# Patient Record
Sex: Female | Born: 1955 | Race: White | Hispanic: No | Smoking: Never smoker
Health system: Southern US, Community
[De-identification: ages and names within clinical notes are randomized; demographics above are authoritative.]

## PROBLEM LIST (undated history)

## (undated) DIAGNOSIS — C439 Malignant melanoma of skin, unspecified: Secondary | ICD-10-CM

## (undated) DIAGNOSIS — R011 Cardiac murmur, unspecified: Secondary | ICD-10-CM

## (undated) DIAGNOSIS — I10 Essential (primary) hypertension: Secondary | ICD-10-CM

## (undated) DIAGNOSIS — F419 Anxiety disorder, unspecified: Secondary | ICD-10-CM

## (undated) DIAGNOSIS — K219 Gastro-esophageal reflux disease without esophagitis: Secondary | ICD-10-CM

## (undated) DIAGNOSIS — J45909 Unspecified asthma, uncomplicated: Secondary | ICD-10-CM

## (undated) DIAGNOSIS — L570 Actinic keratosis: Secondary | ICD-10-CM

## (undated) DIAGNOSIS — M1990 Unspecified osteoarthritis, unspecified site: Secondary | ICD-10-CM

## (undated) DIAGNOSIS — E039 Hypothyroidism, unspecified: Secondary | ICD-10-CM

## (undated) DIAGNOSIS — I639 Cerebral infarction, unspecified: Secondary | ICD-10-CM

## (undated) DIAGNOSIS — T7840XA Allergy, unspecified, initial encounter: Secondary | ICD-10-CM

## (undated) HISTORY — DX: Unspecified osteoarthritis, unspecified site: M19.90

## (undated) HISTORY — DX: Cerebral infarction, unspecified: I63.9

## (undated) HISTORY — DX: Actinic keratosis: L57.0

## (undated) HISTORY — DX: Cardiac murmur, unspecified: R01.1

## (undated) HISTORY — DX: Anxiety disorder, unspecified: F41.9

## (undated) HISTORY — DX: Allergy, unspecified, initial encounter: T78.40XA

## (undated) HISTORY — PX: BACK SURGERY: SHX140

## (undated) HISTORY — PX: CHOLECYSTECTOMY: SHX55

## (undated) HISTORY — DX: Gastro-esophageal reflux disease without esophagitis: K21.9

## (undated) HISTORY — PX: OOPHORECTOMY: SHX86

## (undated) HISTORY — DX: Malignant melanoma of skin, unspecified: C43.9

---

## 1998-08-29 ENCOUNTER — Encounter: Payer: Self-pay | Admitting: Neurosurgery

## 1998-08-29 ENCOUNTER — Inpatient Hospital Stay (HOSPITAL_COMMUNITY): Admission: RE | Admit: 1998-08-29 | Discharge: 1998-08-30 | Payer: Self-pay | Admitting: Neurosurgery

## 1999-03-18 HISTORY — PX: BLADDER SURGERY: SHX569

## 1999-05-31 ENCOUNTER — Encounter: Admission: RE | Admit: 1999-05-31 | Discharge: 1999-05-31 | Payer: Self-pay | Admitting: Family Medicine

## 1999-06-03 ENCOUNTER — Encounter: Payer: Self-pay | Admitting: Family Medicine

## 2004-01-16 DIAGNOSIS — C439 Malignant melanoma of skin, unspecified: Secondary | ICD-10-CM

## 2004-01-16 HISTORY — DX: Malignant melanoma of skin, unspecified: C43.9

## 2004-02-15 HISTORY — PX: MELANOMA EXCISION: SHX5266

## 2004-07-30 ENCOUNTER — Ambulatory Visit: Payer: Self-pay | Admitting: Family Medicine

## 2004-08-02 ENCOUNTER — Ambulatory Visit: Payer: Self-pay | Admitting: Family Medicine

## 2005-03-17 HISTORY — PX: ABDOMINAL HYSTERECTOMY: SHX81

## 2005-09-12 ENCOUNTER — Other Ambulatory Visit: Payer: Self-pay

## 2005-09-18 ENCOUNTER — Ambulatory Visit: Payer: Self-pay | Admitting: Unknown Physician Specialty

## 2006-04-14 ENCOUNTER — Ambulatory Visit: Payer: Self-pay

## 2007-04-05 ENCOUNTER — Ambulatory Visit: Payer: Self-pay | Admitting: Gastroenterology

## 2009-03-14 DIAGNOSIS — L818 Other specified disorders of pigmentation: Secondary | ICD-10-CM

## 2009-03-14 HISTORY — DX: Other specified disorders of pigmentation: L81.8

## 2012-07-28 LAB — HM COLONOSCOPY

## 2013-06-15 ENCOUNTER — Ambulatory Visit: Payer: Self-pay | Admitting: Family Medicine

## 2014-07-17 DIAGNOSIS — E782 Mixed hyperlipidemia: Secondary | ICD-10-CM | POA: Insufficient documentation

## 2014-07-17 DIAGNOSIS — E1169 Type 2 diabetes mellitus with other specified complication: Secondary | ICD-10-CM | POA: Insufficient documentation

## 2014-07-18 DIAGNOSIS — R011 Cardiac murmur, unspecified: Secondary | ICD-10-CM | POA: Insufficient documentation

## 2014-08-16 LAB — LIPID PANEL
Cholesterol: 224 mg/dL — AB (ref 0–200)
HDL: 62 mg/dL (ref 35–70)
LDL CALC: 131 mg/dL
Triglycerides: 157 mg/dL (ref 40–160)

## 2014-08-16 LAB — CBC AND DIFFERENTIAL
HEMATOCRIT: 37 % (ref 36–46)
Hemoglobin: 12.5 g/dL (ref 12.0–16.0)
Neutrophils Absolute: 4 /uL
PLATELETS: 235 10*3/uL (ref 150–399)
WBC: 6.6 10^3/mL

## 2014-08-16 LAB — TSH: TSH: 0.26 u[IU]/mL — AB (ref 0.41–5.90)

## 2014-08-16 LAB — HEPATIC FUNCTION PANEL
ALT: 11 U/L (ref 7–35)
AST: 12 U/L — AB (ref 13–35)
Alkaline Phosphatase: 70 U/L (ref 25–125)
BILIRUBIN, TOTAL: 0.4 mg/dL

## 2014-08-16 LAB — BASIC METABOLIC PANEL
BUN: 12 mg/dL (ref 4–21)
Creatinine: 0.9 mg/dL (ref 0.5–1.1)
GLUCOSE: 99 mg/dL
Potassium: 3.7 mmol/L (ref 3.4–5.3)
SODIUM: 140 mmol/L (ref 137–147)

## 2014-08-17 ENCOUNTER — Telehealth: Payer: Self-pay

## 2014-08-17 DIAGNOSIS — E876 Hypokalemia: Secondary | ICD-10-CM | POA: Insufficient documentation

## 2014-08-17 DIAGNOSIS — C439 Malignant melanoma of skin, unspecified: Secondary | ICD-10-CM | POA: Insufficient documentation

## 2014-08-17 DIAGNOSIS — I10 Essential (primary) hypertension: Secondary | ICD-10-CM | POA: Insufficient documentation

## 2014-08-17 DIAGNOSIS — M1990 Unspecified osteoarthritis, unspecified site: Secondary | ICD-10-CM | POA: Insufficient documentation

## 2014-08-17 DIAGNOSIS — J45909 Unspecified asthma, uncomplicated: Secondary | ICD-10-CM | POA: Insufficient documentation

## 2014-08-17 DIAGNOSIS — I152 Hypertension secondary to endocrine disorders: Secondary | ICD-10-CM | POA: Insufficient documentation

## 2014-08-17 DIAGNOSIS — E1159 Type 2 diabetes mellitus with other circulatory complications: Secondary | ICD-10-CM | POA: Insufficient documentation

## 2014-08-17 DIAGNOSIS — E039 Hypothyroidism, unspecified: Secondary | ICD-10-CM | POA: Insufficient documentation

## 2014-08-17 DIAGNOSIS — J309 Allergic rhinitis, unspecified: Secondary | ICD-10-CM | POA: Insufficient documentation

## 2014-08-17 DIAGNOSIS — Z8249 Family history of ischemic heart disease and other diseases of the circulatory system: Secondary | ICD-10-CM | POA: Insufficient documentation

## 2014-08-17 DIAGNOSIS — E669 Obesity, unspecified: Secondary | ICD-10-CM | POA: Insufficient documentation

## 2014-08-17 NOTE — Telephone Encounter (Signed)
Pt is returning call.  DY#[phone]

## 2014-08-18 MED ORDER — LEVOTHYROXINE SODIUM 125 MCG PO TABS: 125.0000 ug | ORAL_TABLET | Freq: Every day | ORAL | Status: DC

## 2014-08-18 NOTE — Telephone Encounter (Signed)
Message from [place] stable.  Thyroid is still overcorrected. Would recommend decrease Synthroid to 125 and recheck in 6 weeks.  Cholesterol slightly elevated at 233 but 10 year risk of heart disease is only 3 percent.  Recheck in 3 months. Thanks- Dr. Jerilynn Mages.  LMTCB Renaldo Fiddler, CMA

## 2014-08-18 NOTE — Telephone Encounter (Signed)
Pt agrees to decrease Synthroid as below, [facility]. Pt aware of treatment plan as discussed, and verbally acknowledges understanding. Renaldo Fiddler, CMA

## 2014-08-21 ENCOUNTER — Other Ambulatory Visit: Payer: Self-pay | Admitting: Family Medicine

## 2014-08-21 DIAGNOSIS — E876 Hypokalemia: Secondary | ICD-10-CM

## 2014-09-05 ENCOUNTER — Other Ambulatory Visit: Payer: Self-pay | Admitting: Family Medicine

## 2014-09-05 ENCOUNTER — Telehealth: Payer: Self-pay

## 2014-09-05 DIAGNOSIS — Z1231 Encounter for screening mammogram for malignant neoplasm of breast: Secondary | ICD-10-CM

## 2014-09-06 ENCOUNTER — Ambulatory Visit
Admission: RE | Admit: 2014-09-06 | Discharge: 2014-09-06 | Disposition: A | Discharge status: Home / Self Care | Payer: BC Managed Care – PPO | Source: Ambulatory Visit | Attending: Family Medicine | Admitting: Family Medicine

## 2014-09-06 DIAGNOSIS — Z1231 Encounter for screening mammogram for malignant neoplasm of breast: Secondary | ICD-10-CM | POA: Diagnosis not present

## 2014-09-21 ENCOUNTER — Other Ambulatory Visit: Payer: Self-pay | Admitting: Family Medicine

## 2014-09-21 DIAGNOSIS — I10 Essential (primary) hypertension: Secondary | ICD-10-CM

## 2014-09-21 NOTE — Telephone Encounter (Signed)
LVM for pt to return my call.

## 2014-10-10 ENCOUNTER — Ambulatory Visit (INDEPENDENT_AMBULATORY_CARE_PROVIDER_SITE_OTHER): Payer: BC Managed Care – PPO | Admitting: Family Medicine

## 2014-10-10 ENCOUNTER — Encounter: Payer: Self-pay | Admitting: Family Medicine

## 2014-10-10 VITALS — BP 114/70 | HR 94 | Temp 98.1°F | Resp 16 | Ht 66.0 in | Wt 209.8 lb

## 2014-10-10 DIAGNOSIS — R300 Dysuria: Secondary | ICD-10-CM | POA: Diagnosis not present

## 2014-10-10 DIAGNOSIS — N3090 Cystitis, unspecified without hematuria: Secondary | ICD-10-CM

## 2014-10-10 LAB — POCT URINALYSIS DIPSTICK
Bilirubin, UA: NEGATIVE
GLUCOSE UA: NEGATIVE
Ketones, UA: NEGATIVE
Nitrite, UA: NEGATIVE
PH UA: 8
Protein, UA: NEGATIVE
Urobilinogen, UA: NEGATIVE

## 2014-10-10 MED ORDER — NITROFURANTOIN MONOHYD MACRO 100 MG PO CAPS: 100.0000 mg | ORAL_CAPSULE | Freq: Two times a day (BID) | ORAL | Status: DC

## 2014-10-10 NOTE — Progress Notes (Signed)
Subjective:     Patient ID: Angela Villarreal, female   DOB: [birth year], 59 y.o.   MRN: [id number]  HPI  Chief Complaint  Patient presents with  . Dysuria    Patient comes in office today with concerns of burning with urination upon awakening at 4 AM.   Last had an E. Coli infection successfully treated with nitrofurantoin in April of this year.   Review of Systems  Constitutional: Negative for fever and chills.       Objective:   Physical Exam  Constitutional: She appears well-developed and well-nourished. No distress.  Genitourinary:  No cva tenderness       Assessment:    1. Dysuria - POCT urinalysis dipstick - Urine culture - nitrofurantoin, macrocrystal-monohydrate, (MACROBID) 100 MG capsule; Take 1 capsule (100 mg total) by mouth 2 (two) times daily.  Dispense: 14 capsule; Refill: 0  2. Cystitis - nitrofurantoin, macrocrystal-monohydrate, (MACROBID) 100 MG capsule; Take 1 capsule (100 mg total) by mouth 2 (two) times daily.  Dispense: 14 capsule; Refill: 0    Plan:    Further f/u pending culture results.

## 2014-10-10 NOTE — Patient Instructions (Addendum)
Continue to push fluids. We will call you with the culture report.

## 2014-10-13 ENCOUNTER — Encounter: Payer: Self-pay | Admitting: Family Medicine

## 2014-11-22 ENCOUNTER — Other Ambulatory Visit: Payer: Self-pay | Admitting: Family Medicine

## 2014-11-22 DIAGNOSIS — Z780 Asymptomatic menopausal state: Secondary | ICD-10-CM

## 2014-11-22 NOTE — Telephone Encounter (Signed)
Last OV 06/2014  Thanks,   -Kelso Bibby  

## 2015-03-02 ENCOUNTER — Other Ambulatory Visit: Payer: Self-pay | Admitting: Family Medicine

## 2015-03-02 DIAGNOSIS — I10 Essential (primary) hypertension: Secondary | ICD-10-CM

## 2015-03-02 DIAGNOSIS — E876 Hypokalemia: Secondary | ICD-10-CM

## 2015-03-05 DIAGNOSIS — E876 Hypokalemia: Secondary | ICD-10-CM | POA: Insufficient documentation

## 2015-06-02 ENCOUNTER — Other Ambulatory Visit: Payer: Self-pay | Admitting: Family Medicine

## 2015-06-02 DIAGNOSIS — Z780 Asymptomatic menopausal state: Secondary | ICD-10-CM

## 2015-07-11 ENCOUNTER — Ambulatory Visit (INDEPENDENT_AMBULATORY_CARE_PROVIDER_SITE_OTHER): Payer: BC Managed Care – PPO | Admitting: Family Medicine

## 2015-07-11 ENCOUNTER — Encounter: Payer: Self-pay | Admitting: Family Medicine

## 2015-07-11 VITALS — BP 118/78 | HR 78 | Temp 98.4°F | Resp 16 | Wt 226.4 lb

## 2015-07-11 DIAGNOSIS — S39012A Strain of muscle, fascia and tendon of lower back, initial encounter: Secondary | ICD-10-CM | POA: Diagnosis not present

## 2015-07-11 MED ORDER — CYCLOBENZAPRINE HCL 5 MG PO TABS: 5.0000 mg | ORAL_TABLET | Freq: Three times a day (TID) | ORAL | Status: DC | PRN

## 2015-07-11 MED ORDER — TRAMADOL HCL 50 MG PO TABS: 50.0000 mg | ORAL_TABLET | Freq: Four times a day (QID) | ORAL | Status: DC | PRN

## 2015-07-11 NOTE — Progress Notes (Signed)
Subjective:     Patient ID: Angela Villarreal, female   DOB: [birth year], 60 y.o.   MRN: [id number]  HPI  Chief Complaint  Patient presents with  . Back Pain    Patient comes in office with conerns of lower back pain that began yesterday afternoon. Patient states that she has been using a rolling computer bag when working at [employer], she is unsure if heavy lifting triggered pain in back. Patient has been taking otc Aleve and a old prescription prescribed back in 01/2013 for Tramadol  Also has been taking Aleve. Reports two prior back surgeries. Reports increased pain with bending, twisting, and changing positions.   Review of Systems     Objective:   Physical Exam  Constitutional: She appears well-developed and well-nourished. No distress.  Musculoskeletal:  Muscle strength in lower extremities 5/5. SLR's to 90 degrees without radiation of back pain. Tender across her lower lumbar back. Well healed scar noted.       Assessment:    1. Low back strain, initial encounter - cyclobenzaprine (FLEXERIL) 5 MG tablet; Take 1 tablet (5 mg total) by mouth 3 (three) times daily as needed for muscle spasms.  Dispense: 30 tablet; Refill: 0 - traMADol (ULTRAM) 50 MG tablet; Take 1 tablet (50 mg total) by mouth every 6 (six) hours as needed.  Dispense: 28 tablet; Refill: 1    Plan:    Discussed use of cold and warm compresses and continued use of Aleve.

## 2015-07-11 NOTE — Patient Instructions (Signed)
Continue Aleve two pills twice daily with food and application of cold compresses for 20 minutes several x day then go to heat.

## 2015-08-03 ENCOUNTER — Other Ambulatory Visit: Payer: Self-pay | Admitting: Family Medicine

## 2015-08-03 DIAGNOSIS — E039 Hypothyroidism, unspecified: Secondary | ICD-10-CM

## 2015-08-03 NOTE — Telephone Encounter (Signed)
Last TSH checked 08/16/2014

## 2015-09-03 ENCOUNTER — Other Ambulatory Visit: Payer: Self-pay | Admitting: Family Medicine

## 2015-09-03 DIAGNOSIS — I10 Essential (primary) hypertension: Secondary | ICD-10-CM

## 2015-09-03 DIAGNOSIS — E876 Hypokalemia: Secondary | ICD-10-CM

## 2015-09-03 DIAGNOSIS — E039 Hypothyroidism, unspecified: Secondary | ICD-10-CM

## 2015-09-04 ENCOUNTER — Encounter: Payer: Self-pay | Admitting: Family Medicine

## 2015-09-04 ENCOUNTER — Ambulatory Visit (INDEPENDENT_AMBULATORY_CARE_PROVIDER_SITE_OTHER): Payer: BC Managed Care – PPO | Admitting: Family Medicine

## 2015-09-04 VITALS — BP 136/84 | HR 88 | Temp 98.8°F | Resp 16 | Ht 66.5 in | Wt 226.0 lb

## 2015-09-04 DIAGNOSIS — Z1231 Encounter for screening mammogram for malignant neoplasm of breast: Secondary | ICD-10-CM | POA: Diagnosis not present

## 2015-09-04 DIAGNOSIS — I10 Essential (primary) hypertension: Secondary | ICD-10-CM

## 2015-09-04 DIAGNOSIS — Z0000 Encounter for general adult medical examination without abnormal findings: Secondary | ICD-10-CM

## 2015-09-04 DIAGNOSIS — E039 Hypothyroidism, unspecified: Secondary | ICD-10-CM | POA: Diagnosis not present

## 2015-09-04 DIAGNOSIS — E7800 Pure hypercholesterolemia, unspecified: Secondary | ICD-10-CM

## 2015-09-04 DIAGNOSIS — K589 Irritable bowel syndrome without diarrhea: Secondary | ICD-10-CM | POA: Diagnosis not present

## 2015-09-04 LAB — POCT URINALYSIS DIPSTICK
Bilirubin, UA: NEGATIVE
Blood, UA: NEGATIVE
GLUCOSE UA: NEGATIVE
KETONES UA: NEGATIVE
LEUKOCYTES UA: NEGATIVE
Nitrite, UA: NEGATIVE
PROTEIN UA: NEGATIVE
Spec Grav, UA: 1.01
Urobilinogen, UA: 0.2
pH, UA: 7

## 2015-09-04 MED ORDER — HYOSCYAMINE SULFATE ER 0.375 MG PO TB12: 0.3750 mg | ORAL_TABLET | Freq: Two times a day (BID) | ORAL | Status: DC

## 2015-09-04 NOTE — Progress Notes (Signed)
Patient: Angela Villarreal, Female    DOB: [birth year], 60 y.o.   MRN: [id number] Visit Date: 09/04/2015  Today's Provider: Margarita Rana, MD   Chief Complaint  Patient presents with  . Annual Exam   Subjective:    Annual physical exam Angela Villarreal is a 60 y.o. female who presents today for health maintenance and complete physical. She feels well. She reports not exercising; she is having trouble with Plantar Fasciitis.  Also still having trouble with back pain; it is a lot better from April.   She reports she is sleeping well.  -----------------------------------------------------------------   Review of Systems  Constitutional: Negative.   HENT: Negative.   Eyes: Negative.   Respiratory: Negative.   Cardiovascular: Negative.   Gastrointestinal: Positive for diarrhea. Negative for nausea, vomiting, abdominal pain, constipation, blood in stool, abdominal distention, anal bleeding and rectal pain.       History of IBS.    Endocrine: Negative.   Genitourinary: Negative.   Musculoskeletal: Positive for back pain and arthralgias. Negative for myalgias, joint swelling, gait problem, neck pain and neck stiffness.  Skin: Negative.   Allergic/Immunologic: Negative.   Neurological: Negative.   Hematological: Negative for adenopathy. Bruises/bleeds easily.  Psychiatric/Behavioral: Negative.     Social History      She  reports that she has never smoked. She does not have any smokeless tobacco history on file. She reports that she drinks alcohol. She reports that she does not use illicit drugs.       Social History   Social History  . Marital Status: Divorced    Spouse Name: N/A  . Number of Children: N/A  . Years of Education: N/A   Social History Main Topics  . Smoking status: Never Smoker   . Smokeless tobacco: None  . Alcohol Use: Yes     Comment: once monthly  . Drug Use: No  . Sexual Activity: Not Asked   Other Topics Concern  . None   Social History  Narrative    Past Medical History  Diagnosis Date  . Cancer [facility])     melanoma     Patient Active Problem List   Diagnosis Date Noted  . Hypokalemia 03/05/2015  . Post-menopausal 11/22/2014  . Allergic rhinitis 08/17/2014  . Airway hyperreactivity 08/17/2014  . Family history of cardiomyopathy 08/17/2014  . Hypercholesteremia 08/17/2014  . BP (high blood pressure) 08/17/2014  . Decreased potassium in the blood 08/17/2014  . Adult hypothyroidism 08/17/2014  . Adaptive colitis 08/17/2014  . 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 Adiposity 08/17/2014  . Arthritis, degenerative 08/17/2014  . Cardiac murmur 07/18/2014  . Benign essential HTN 07/17/2014    Past Surgical History  Procedure Laterality Date  . Abdominal hysterectomy  2007  . Melanoma excision  02/2004    removed from face  . Back surgery  1989, 2000  . Bladder surgery  2001    Tacked    Family History        Family Status  Relation Status Death Age  . Mother Deceased   . Father Deceased   . Sister Alive         Her family history includes Alzheimer's disease in her mother; Cancer in her mother; Colon cancer in her mother; Non-Hodgkin's lymphoma in her father; Squamous cell carcinoma in her mother; Thyroid disease in her sister.    Allergies  Allergen Reactions  . Gentamicin Sulfate     No outpatient  prescriptions have been marked as taking for the 09/04/15 encounter (Office Visit) with Margarita Rana, MD.    Patient Care Team: Margarita Rana, MD as PCP - General (Family Medicine)     Objective:   Vitals: BP 136/84 mmHg  Pulse 88  Temp(Src) 98.8 F (37.1 C) (Oral)  Resp 16  Ht 5' 6.5" (1.689 m)  Wt 226 lb (102.513 kg)  BMI 35.94 kg/m2   Physical Exam  Constitutional: She is oriented to person, place, and time. She appears well-developed and well-nourished.  HENT:  Head: Normocephalic and atraumatic.  Right Ear: External ear normal.  Left Ear: External ear normal.    Mouth/Throat: Oropharynx is clear and moist.  Eyes: Conjunctivae are normal. Pupils are equal, round, and reactive to light.  Neck: Normal range of motion. Neck supple. Carotid bruit is not present.  Cardiovascular: Normal rate, regular rhythm, normal heart sounds and intact distal pulses.   Pulmonary/Chest: Effort normal and breath sounds normal. Right breast exhibits no inverted nipple, no mass, no nipple discharge, no skin change and no tenderness. Left breast exhibits no inverted nipple, no mass, no nipple discharge, no skin change and no tenderness. Breasts are symmetrical.  Abdominal: Soft. Bowel sounds are normal.  Musculoskeletal: Normal range of motion.  Neurological: She is alert and oriented to person, place, and time.  Skin: Skin is warm and dry.  Psychiatric: She has a normal mood and affect. Her behavior is normal. Judgment and thought content normal.     Depression Screen PHQ 2/9 Scores 09/04/2015  PHQ - 2 Score 0      Assessment & Plan:     Routine Health Maintenance and Physical Exam  Exercise Activities and Dietary recommendations Goals    None      Immunization History  Administered Date(s) Administered  . Tdap 04/15/2010    Health Maintenance  Topic Date Due  . PAP SMEAR  01/31/1977  . INFLUENZA VACCINE  10/16/2015  . MAMMOGRAM  09/05/2016  . TETANUS/TDAP  04/15/2020  . COLONOSCOPY  07/29/2022  . Hepatitis C Screening  Completed  . HIV Screening  Completed      Discussed health benefits of physical activity, and encouraged her to engage in regular exercise appropriate for her age and condition.    ---------- Results for orders placed or performed in visit on 09/04/15  POCT urinalysis dipstick  Result Value Ref Range   Color, UA Yellow    Clarity, UA Clear    Glucose, UA Negative    Bilirubin, UA Negative    Ketones, UA Negative    Spec Grav, UA 1.010    Blood, UA Negative    pH, UA 7.0    Protein, UA Negative    Urobilinogen, UA 0.2     Nitrite, UA Negative    Leukocytes, UA Negative Negative  ----------------------------------------------------------  2. Hypercholesteremia Stable; will check labs.   - CBC with Differential/Platelet - Lipid panel  3. Hypothyroidism, unspecified hypothyroidism type Stable, will check labs.    - CBC with Differential/Platelet - Comprehensive metabolic panel - TSH  4. Benign essential HTN Stable, continue current medications.  Will follow up with Dr. Caryn Section.   - CBC with Differential/Platelet  5. Encounter for screening mammogram for breast cancer Ordered Mammogram   - Mammogram Digital Screening  6. IBS (irritable bowel syndrome) Stable; only needs Hyoscyamine as needed.  Refills provided.   - hyoscyamine (LEVBID) 0.375 MG 12 hr tablet; Take 1 tablet (0.375 mg total) by mouth 2 (two) times  daily.  Dispense: 60 tablet; Refill: 0  Patient was seen and examined by Jerrell Belfast, MD, and note scribed by Ashley Royalty, CMA.  I have reviewed the document for accuracy and completeness and I agree with above. - Jerrell Belfast, MD      Margarita Rana, MD  [facility]

## 2015-09-05 ENCOUNTER — Other Ambulatory Visit: Payer: Self-pay | Admitting: Family Medicine

## 2015-09-06 LAB — CBC WITH DIFFERENTIAL/PLATELET
BASOS: 1 %
Basophils Absolute: 0 10*3/uL (ref 0.0–0.2)
EOS (ABSOLUTE): 0.2 10*3/uL (ref 0.0–0.4)
Eos: 3 %
Hematocrit: 36.4 % (ref 34.0–46.6)
Hemoglobin: 12.5 g/dL (ref 11.1–15.9)
IMMATURE GRANS (ABS): 0 10*3/uL (ref 0.0–0.1)
Immature Granulocytes: 0 %
LYMPHS: 34 %
Lymphocytes Absolute: 2.4 10*3/uL (ref 0.7–3.1)
MCH: 29.2 pg (ref 26.6–33.0)
MCHC: 34.3 g/dL (ref 31.5–35.7)
MCV: 85 fL (ref 79–97)
Monocytes Absolute: 0.3 10*3/uL (ref 0.1–0.9)
Monocytes: 4 %
Neutrophils Absolute: 4.1 10*3/uL (ref 1.4–7.0)
Neutrophils: 58 %
PLATELETS: 241 10*3/uL (ref 150–379)
RBC: 4.28 x10E6/uL (ref 3.77–5.28)
RDW: 13.6 % (ref 12.3–15.4)
WBC: 7.1 10*3/uL (ref 3.4–10.8)

## 2015-09-06 LAB — TSH: TSH: 1.68 u[IU]/mL (ref 0.450–4.500)

## 2015-09-06 LAB — COMPREHENSIVE METABOLIC PANEL
ALK PHOS: 68 IU/L (ref 39–117)
ALT: 11 IU/L (ref 0–32)
AST: 12 IU/L (ref 0–40)
Albumin/Globulin Ratio: 1.4 (ref 1.2–2.2)
Albumin: 4.2 g/dL (ref 3.5–5.5)
BILIRUBIN TOTAL: 0.2 mg/dL (ref 0.0–1.2)
BUN/Creatinine Ratio: 12 (ref 9–23)
BUN: 10 mg/dL (ref 6–24)
CHLORIDE: 97 mmol/L (ref 96–106)
CO2: 27 mmol/L (ref 18–29)
Calcium: 9.4 mg/dL (ref 8.7–10.2)
Creatinine, Ser: 0.81 mg/dL (ref 0.57–1.00)
GFR calc Af Amer: 92 mL/min/{1.73_m2} (ref >59)
GFR calc non Af Amer: 80 mL/min/{1.73_m2} (ref >59)
GLUCOSE: 85 mg/dL (ref 65–99)
Globulin, Total: 3.1 g/dL (ref 1.5–4.5)
Potassium: 3.1 mmol/L — ABNORMAL LOW (ref 3.5–5.2)
Sodium: 143 mmol/L (ref 134–144)
TOTAL PROTEIN: 7.3 g/dL (ref 6.0–8.5)

## 2015-09-06 LAB — LIPID PANEL
Chol/HDL Ratio: 4.5 ratio units — ABNORMAL HIGH (ref 0.0–4.4)
Cholesterol, Total: 223 mg/dL — ABNORMAL HIGH (ref 100–199)
HDL: 50 mg/dL (ref >39)
LDL CALC: 116 mg/dL — AB (ref 0–99)
Triglycerides: 285 mg/dL — ABNORMAL HIGH (ref 0–149)
VLDL CHOLESTEROL CAL: 57 mg/dL — AB (ref 5–40)

## 2015-09-06 LAB — SPECIMEN STATUS REPORT

## 2015-09-07 ENCOUNTER — Other Ambulatory Visit: Payer: Self-pay

## 2015-09-07 DIAGNOSIS — J309 Allergic rhinitis, unspecified: Secondary | ICD-10-CM

## 2015-09-07 DIAGNOSIS — I10 Essential (primary) hypertension: Secondary | ICD-10-CM

## 2015-09-07 DIAGNOSIS — E876 Hypokalemia: Secondary | ICD-10-CM

## 2015-09-07 DIAGNOSIS — S39012A Strain of muscle, fascia and tendon of lower back, initial encounter: Secondary | ICD-10-CM

## 2015-09-07 DIAGNOSIS — E039 Hypothyroidism, unspecified: Secondary | ICD-10-CM

## 2015-09-07 MED ORDER — CYCLOBENZAPRINE HCL 5 MG PO TABS: 5.0000 mg | ORAL_TABLET | Freq: Three times a day (TID) | ORAL | Status: DC | PRN

## 2015-09-07 MED ORDER — SYNTHROID 125 MCG PO TABS: 125.0000 ug | ORAL_TABLET | Freq: Every day | ORAL | Status: DC

## 2015-09-07 MED ORDER — ALBUTEROL SULFATE HFA 108 (90 BASE) MCG/ACT IN AERS: 1.0000 | INHALATION_SPRAY | RESPIRATORY_TRACT | Status: DC | PRN

## 2015-09-07 MED ORDER — ADVAIR DISKUS 250-50 MCG/DOSE IN AEPB
1.0000 | INHALATION_SPRAY | Freq: Two times a day (BID) | RESPIRATORY_TRACT | Status: DC
Start: 2015-09-07 — End: 2019-01-05

## 2015-09-07 MED ORDER — MONTELUKAST SODIUM 10 MG PO TABS: 10.0000 mg | ORAL_TABLET | Freq: Every day | ORAL | Status: AC

## 2015-09-07 MED ORDER — POTASSIUM CHLORIDE CRYS ER 20 MEQ PO TBCR: 20.0000 meq | EXTENDED_RELEASE_TABLET | Freq: Two times a day (BID) | ORAL | Status: DC

## 2015-09-07 MED ORDER — TRIAMTERENE-HCTZ 37.5-25 MG PO TABS: 1.0000 | ORAL_TABLET | Freq: Every day | ORAL | Status: DC

## 2015-09-07 NOTE — Telephone Encounter (Signed)
Printed, please fax or call in to pharmacy. Thank you.   

## 2015-09-07 NOTE — Telephone Encounter (Signed)
-----   Message from Margarita Rana, MD sent at 09/06/2015  2:31 PM EDT ----- Labs stable. Mildly elevated triglycerides, which is concerning for sugar issues.  Make sure to eat healthy, exercise and recheck annually. Also, low potassium. Please see if she is taking her medication. Thanks.

## 2015-09-07 NOTE — Telephone Encounter (Signed)
Pt advised as directed below.  She states she needs her refills sent to [facility].   Thanks,   -Mickel Baas

## 2015-09-12 ENCOUNTER — Encounter: Payer: BC Managed Care – PPO | Admitting: Family Medicine

## 2015-10-22 ENCOUNTER — Ambulatory Visit
Admission: RE | Admit: 2015-10-22 | Discharge: 2015-10-22 | Disposition: A | Discharge status: Home / Self Care | Payer: BC Managed Care – PPO | Source: Ambulatory Visit | Attending: Family Medicine | Admitting: Family Medicine

## 2015-10-22 ENCOUNTER — Ambulatory Visit: Payer: BC Managed Care – PPO

## 2015-10-22 DIAGNOSIS — Z1231 Encounter for screening mammogram for malignant neoplasm of breast: Secondary | ICD-10-CM | POA: Diagnosis present

## 2015-12-04 ENCOUNTER — Other Ambulatory Visit: Payer: Self-pay | Admitting: Family Medicine

## 2016-03-04 ENCOUNTER — Other Ambulatory Visit: Payer: Self-pay | Admitting: Family Medicine

## 2016-03-04 DIAGNOSIS — E039 Hypothyroidism, unspecified: Secondary | ICD-10-CM

## 2016-03-04 NOTE — Telephone Encounter (Signed)
Please review-aa 

## 2016-03-31 ENCOUNTER — Other Ambulatory Visit: Payer: Self-pay | Admitting: Family Medicine

## 2016-03-31 DIAGNOSIS — I10 Essential (primary) hypertension: Secondary | ICD-10-CM

## 2016-03-31 DIAGNOSIS — E876 Hypokalemia: Secondary | ICD-10-CM

## 2016-03-31 NOTE — Telephone Encounter (Signed)
Please review-aa 

## 2016-09-04 ENCOUNTER — Ambulatory Visit
Admission: RE | Admit: 2016-09-04 | Discharge: 2016-09-04 | Disposition: A | Discharge status: Home / Self Care | Payer: BC Managed Care – PPO | Source: Ambulatory Visit | Attending: Family Medicine | Admitting: Family Medicine

## 2016-09-04 ENCOUNTER — Ambulatory Visit (INDEPENDENT_AMBULATORY_CARE_PROVIDER_SITE_OTHER): Payer: BC Managed Care – PPO | Admitting: Family Medicine

## 2016-09-04 ENCOUNTER — Encounter: Payer: Self-pay | Admitting: Family Medicine

## 2016-09-04 VITALS — BP 132/72 | HR 72 | Temp 98.1°F | Resp 16 | Ht 67.0 in | Wt 226.0 lb

## 2016-09-04 DIAGNOSIS — M545 Low back pain: Secondary | ICD-10-CM

## 2016-09-04 DIAGNOSIS — Z85820 Personal history of malignant melanoma of skin: Secondary | ICD-10-CM | POA: Diagnosis not present

## 2016-09-04 DIAGNOSIS — Z780 Asymptomatic menopausal state: Secondary | ICD-10-CM

## 2016-09-04 DIAGNOSIS — I83813 Varicose veins of bilateral lower extremities with pain: Secondary | ICD-10-CM | POA: Insufficient documentation

## 2016-09-04 DIAGNOSIS — E039 Hypothyroidism, unspecified: Secondary | ICD-10-CM

## 2016-09-04 DIAGNOSIS — I10 Essential (primary) hypertension: Secondary | ICD-10-CM

## 2016-09-04 DIAGNOSIS — M5440 Lumbago with sciatica, unspecified side: Secondary | ICD-10-CM | POA: Diagnosis present

## 2016-09-04 DIAGNOSIS — K589 Irritable bowel syndrome without diarrhea: Secondary | ICD-10-CM | POA: Diagnosis not present

## 2016-09-04 DIAGNOSIS — Z0000 Encounter for general adult medical examination without abnormal findings: Secondary | ICD-10-CM

## 2016-09-04 MED ORDER — ESTRADIOL 0.5 MG PO TABS: 0.2500 mg | ORAL_TABLET | Freq: Every day | ORAL | 2 refills | Status: DC

## 2016-09-04 NOTE — Progress Notes (Signed)
Patient: Angela Villarreal, Female    DOB: 10/09/1955, 61 y.o.   MRN: [id number] Visit Date: 09/04/2016  Today's Provider: Lelon Huh, MD   Chief Complaint  Patient presents with  . Annual Exam  . Hypertension  . Hyperlipidemia  . Back Pain   Subjective:  This is a previous patient of Dr. Venia Minks present today as new patient to me to establish care and follow up on chronic medical problems.     Annual physical exam Angela Villarreal is a 61 y.o. female who presents today for health maintenance and complete physical. She feels well. She reports exercising occasionally. She reports she is sleeping fairly well.  Colonoscopy- 07/28/2012. Repeat in 5 years.  Mammogram- 10/22/2015. Normal.  Hx of hysterectomy in 2007 Tdap- 04/15/2010    Hypertension, follow-up:  BP Readings from Last 3 Encounters:  09/04/16 132/72  09/04/15 136/84  07/11/15 118/78    She was last seen for hypertension 1 years ago.  BP at that visit was 136/84. Management since that visit includes no changes. She reports good compliance with treatment. She is not having side effects.  She is exercising. She is adherent to low salt diet.   Outside blood pressures are checked occasionally. She is experiencing lower extremity edema.  Patient denies exertional chest pressure/discomfort and palpitations.   Cardiovascular risk factors include dyslipidemia.     Weight trend: stable Wt Readings from Last 3 Encounters:  09/04/16 226 lb (102.5 kg)  09/04/15 226 lb (102.5 kg)  07/11/15 226 lb 6.4 oz (102.7 kg)    Current diet: well balanced   Lipid/Cholesterol, Follow-up:   Last seen for this1 years ago.  Management changes since that visit include no changes. . Last Lipid Panel:    Component Value Date/Time   CHOL 223 (H) 09/05/2015 1053   TRIG 285 (H) 09/05/2015 1053   HDL 50 09/05/2015 1053   CHOLHDL 4.5 (H) 09/05/2015 1053   LDLCALC 116 (H) 09/05/2015 1053    Risk factors for vascular  disease include hypertension  She reports good compliance with treatment. She is not having side effects.  Current symptoms include none and have been stable. Weight trend: stable Prior visit with dietician: no Current diet: well balanced Current exercise: none  Wt Readings from Last 3 Encounters:  09/04/16 226 lb (102.5 kg)  09/04/15 226 lb (102.5 kg)  07/11/15 226 lb 6.4 oz (102.7 kg)    Back pain: Patient reports that she has had constant back pain for the last 3 months. She denies any injuries. Patient reports that the pain radiates to her tailbone. The pain is worse when she sits for a long period of time.   She also complains of worsening varicose veins. She recently saw her dermatologist and had to be treated for venous stasis dermatitis and she inquires if anything can be done about her varicose veins.   Also wishes to discuss ERT. She had total hysterectomy and BSO 10 years ago. 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 Now down to 1/2 0.5mg  a day and having some hot flashes.    Review of Systems  Constitutional: Negative.   HENT: Negative.   Eyes: Negative.   Respiratory: Negative.   Cardiovascular: Positive for leg swelling.  Gastrointestinal: Negative.   Endocrine: Negative.   Genitourinary: Negative.   Musculoskeletal: Positive for arthralgias and back pain.  Allergic/Immunologic: Negative.   Neurological: Negative.   Hematological: Bruises/bleeds easily.  Psychiatric/Behavioral: Negative.     Social  History      She  reports that she has never smoked. She has never used smokeless tobacco. She reports that she drinks alcohol. She reports that she does not use drugs.       Social History   Social History  . Marital status: Divorced    Spouse name: N/A  . Number of children: N/A  . Years of education: N/A   Social History Main Topics  . Smoking status: Never Smoker  . Smokeless tobacco: Never Used  . Alcohol use Yes     Comment: once monthly  .  Drug use: No  . Sexual activity: Not Asked   Other Topics Concern  . None   Social History Narrative  . None    Past Medical History:  Diagnosis Date  . Cancer [facility])    melanoma     Patient Active Problem List   Diagnosis Date Noted  . IBS (irritable bowel syndrome) 09/04/2015  . Hypokalemia 03/05/2015  . Post-menopausal 11/22/2014  . Allergic rhinitis 08/17/2014  . Airway hyperreactivity 08/17/2014  . Family history of cardiomyopathy 08/17/2014  . Hypercholesteremia 08/17/2014  . BP (high blood pressure) 08/17/2014  . Decreased potassium in the blood 08/17/2014  . Adult hypothyroidism 08/17/2014  . Adaptive colitis 08/17/2014  . 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 Adiposity 08/17/2014  . Arthritis, degenerative 08/17/2014  . Cardiac murmur 07/18/2014  . Benign essential HTN 07/17/2014    Past Surgical History:  Procedure Laterality Date  . ABDOMINAL HYSTERECTOMY  2007  . BACK SURGERY  1989, 2000  . BLADDER SURGERY  2001   Tacked  . MELANOMA EXCISION  02/2004   removed from face    Family History        Family Status  Relation Status  . Mother Deceased  . Father Deceased  . Sister Alive        Her family history includes Alzheimer's disease in her mother; Cancer in her mother; Colon cancer in her mother; Non-Hodgkin's lymphoma in her father; Squamous cell carcinoma in her mother; Thyroid disease in her sister.     Allergies  Allergen Reactions  . Gentamicin Sulfate      Current Outpatient Prescriptions:  .  ADVAIR DISKUS 250-50 MCG/DOSE AEPB, Inhale 1 puff into the lungs 2 (two) times daily., Disp: 180 each, Rfl: 3 .  albuterol (PROVENTIL HFA;VENTOLIN HFA) 108 (90 Base) MCG/ACT inhaler, Inhale 1-2 puffs into the lungs every 4 (four) hours as needed for wheezing or shortness of breath., Disp: 3 Inhaler, Rfl: 3 .  cetirizine (ZYRTEC) 10 MG tablet, Take by mouth., Disp: , Rfl:  .  cyclobenzaprine (FLEXERIL) 5 MG tablet, Take 1 tablet (5 mg  total) by mouth 3 (three) times daily as needed for muscle spasms., Disp: 30 tablet, Rfl: 5 .  estradiol (ESTRACE) 0.5 MG tablet, TAKE ONE TABLET BY MOUTH EVERY DAY (Patient taking differently: take 1/2 tablet daily), Disp: 30 tablet, Rfl: 9 .  FLUTICASONE PROPIONATE, NASAL, NA, Place into the nose., Disp: , Rfl:  .  hyoscyamine (LEVBID) 0.375 MG 12 hr tablet, Take 1 tablet (0.375 mg total) by mouth 2 (two) times daily., Disp: 60 tablet, Rfl: 0 .  montelukast (SINGULAIR) 10 MG tablet, Take 1 tablet (10 mg total) by mouth at bedtime., Disp: 90 tablet, Rfl: 1 .  PAZEO 0.7 % SOLN, , Disp: , Rfl:  .  potassium chloride SA (K-DUR,KLOR-CON) 20 MEQ tablet, TAKE ONE TABLET BY MOUTH TWICE DAILY, Disp: 180 tablet, Rfl: 2 .  SYNTHROID 125 MCG tablet, TAKE ONE TABLET BY MOUTH EVERY DAY, Disp: 90 tablet, Rfl: 3 .  traMADol (ULTRAM) 50 MG tablet, Take 1 tablet (50 mg total) by mouth every 6 (six) hours as needed., Disp: 28 tablet, Rfl: 1 .  triamterene-hydrochlorothiazide (MAXZIDE-25) 37.5-25 MG tablet, TAKE ONE TABLET BY MOUTH EVERY DAY, Disp: 90 tablet, Rfl: 2   Patient Care Team: Birdie Sons, MD as PCP - General (Family Medicine)      Objective:   Vitals: BP 132/72 (BP Location: Right Arm, Patient Position: Sitting, Cuff Size: Large)   Pulse 72   Temp 98.1 F (36.7 C)   Resp 16   Ht 5\' 7"  (1.702 m)   Wt 226 lb (102.5 kg)   SpO2 95%   BMI 35.40 kg/m    Vitals:   09/04/16 1030  BP: 132/72  Pulse: 72  Resp: 16  Temp: 98.1 F (36.7 C)  SpO2: 95%  Weight: 226 lb (102.5 kg)  Height: 5\' 7"  (1.702 m)     Physical Exam  Constitutional: She is oriented to person, place, and time. She appears well-developed and well-nourished.  HENT:  Head: Normocephalic and atraumatic.  Right Ear: External ear normal.  Left Ear: External ear normal.  Nose: Nose normal.  Mouth/Throat: Oropharynx is clear and moist.  Eyes: Conjunctivae and EOM are normal. Pupils are equal, round, and reactive to light.    Cardiovascular: Normal rate, regular rhythm, normal heart sounds and intact distal pulses.   Pulmonary/Chest: Effort normal and breath sounds normal. Right breast exhibits no inverted nipple, no mass, no nipple discharge, no skin change and no tenderness. Left breast exhibits no inverted nipple, no mass, no nipple discharge, no skin change and no tenderness. Breasts are symmetrical.  Abdominal: Soft. Bowel sounds are normal.  Musculoskeletal: Normal range of motion.  Neurological: She is alert and oriented to person, place, and time.  Skin: Skin is warm and dry.  Psychiatric: She has a normal mood and affect. Her behavior is normal. Judgment and thought content normal.     Depression Screen PHQ 2/9 Scores 09/04/2016 09/04/2015  PHQ - 2 Score 0 0  PHQ- 9 Score 3 -      Assessment & Plan:     Routine Health Maintenance and Physical Exam  Exercise Activities and Dietary recommendations Goals    None      Immunization History  Administered Date(s) Administered  . Tdap 04/15/2010    Health Maintenance  Topic Date Due  . INFLUENZA VACCINE  10/15/2016  . COLONOSCOPY  07/28/2017  . MAMMOGRAM  10/21/2017  . TETANUS/TDAP  04/15/2020  . Hepatitis C Screening  Completed  . HIV Screening  Completed     Discussed health benefits of physical activity, and encouraged her to engage in regular exercise appropriate for her age and condition.   1. Annual physical exam Generally doing well. Work on diet and exericise.   - Comprehensive metabolic panel - Lipid panel - T4 AND TSH  2. Essential hypertension Well controlled.  Continue current medications.    3. Adult hypothyroidism Check TSH  4. Irritable bowel syndrome, unspecified type Stable.   5. Low back pain, unspecified back pain laterality, unspecified chronicity, with sciatica presence unspecified No trauma. Check xr - DG Sacrum/Coccyx; Future  6. Varicose veins of bilateral lower extremities with pain Causing some  issues with statis dermatitis. She wishes to to see if something can be done about veins.  - Ambulatory referral to Vascular Surgery  7.  History of malignant melanoma Continue routine follow up Nehemiah Massed.  8. Post menopausal Advised to wean or stop estrogen replacement over the next few months.     Lelon Huh, MD  [facility]

## 2016-09-04 NOTE — Patient Instructions (Signed)
Preventive Care 40-64 Years, Female Preventive care refers to lifestyle choices and visits with your health care provider that can promote health and wellness. What does preventive care include?  A yearly physical exam. This is also called an annual well check.  Dental exams once or twice a year.  Routine eye exams. Ask your health care provider how often you should have your eyes checked.  Personal lifestyle choices, including: ? Daily care of your teeth and gums. ? Regular physical activity. ? Eating a healthy diet. ? Avoiding tobacco and drug use. ? Limiting alcohol use. ? Practicing safe sex. ? Taking low-dose aspirin daily starting at age 58. ? Taking vitamin and mineral supplements as recommended by your health care provider. What happens during an annual well check? The services and screenings done by your health care provider during your annual well check will depend on your age, overall health, lifestyle risk factors, and family history of disease. Counseling Your health care provider may ask you questions about your:  Alcohol use.  Tobacco use.  Drug use.  Emotional well-being.  Home and relationship well-being.  Sexual activity.  Eating habits.  Work and work Statistician.  Method of birth control.  Menstrual cycle.  Pregnancy history.  Screening You may have the following tests or measurements:  Height, weight, and BMI.  Blood pressure.  Lipid and cholesterol levels. These may be checked every 5 years, or more frequently if you are over 81 years old.  Skin check.  Lung cancer screening. You may have this screening every year starting at age 78 if you have a 30-pack-year history of smoking and currently smoke or have quit within the past 15 years.  Fecal occult blood test (FOBT) of the stool. You may have this test every year starting at age 65.  Flexible sigmoidoscopy or colonoscopy. You may have a sigmoidoscopy every 5 years or a colonoscopy  every 10 years starting at age 30.  Hepatitis C blood test.  Hepatitis B blood test.  Sexually transmitted disease (STD) testing.  Diabetes screening. This is done by checking your blood sugar (glucose) after you have not eaten for a while (fasting). You may have this done every 1-3 years.  Mammogram. This may be done every 1-2 years. Talk to your health care provider about when you should start having regular mammograms. This may depend on whether you have a family history of breast cancer.  BRCA-related cancer screening. This may be done if you have a family history of breast, ovarian, tubal, or peritoneal cancers.  Pelvic exam and Pap test. This may be done every 3 years starting at age 80. Starting at age 36, this may be done every 5 years if you have a Pap test in combination with an HPV test.  Bone density scan. This is done to screen for osteoporosis. You may have this scan if you are at high risk for osteoporosis.  Discuss your test results, treatment options, and if necessary, the need for more tests with your health care provider. Vaccines Your health care provider may recommend certain vaccines, such as:  Influenza vaccine. This is recommended every year.  Tetanus, diphtheria, and acellular pertussis (Tdap, Td) vaccine. You may need a Td booster every 10 years.  Varicella vaccine. You may need this if you have not been vaccinated.  Zoster vaccine. You may need this after age 5.  Measles, mumps, and rubella (MMR) vaccine. You may need at least one dose of MMR if you were born in  1957 or later. You may also need a second dose.  Pneumococcal 13-valent conjugate (PCV13) vaccine. You may need this if you have certain conditions and were not previously vaccinated.  Pneumococcal polysaccharide (PPSV23) vaccine. You may need one or two doses if you smoke cigarettes or if you have certain conditions.  Meningococcal vaccine. You may need this if you have certain  conditions.  Hepatitis A vaccine. You may need this if you have certain conditions or if you travel or work in places where you may be exposed to hepatitis A.  Hepatitis B vaccine. You may need this if you have certain conditions or if you travel or work in places where you may be exposed to hepatitis B.  Haemophilus influenzae type b (Hib) vaccine. You may need this if you have certain conditions.  Talk to your health care provider about which screenings and vaccines you need and how often you need them. This information is not intended to replace advice given to you by your health care provider. Make sure you discuss any questions you have with your health care provider. Document Released: 03/30/2015 Document Revised: 11/21/2015 Document Reviewed: 01/02/2015 Elsevier Interactive Patient Education  2017 [employer].

## 2016-09-05 LAB — COMPREHENSIVE METABOLIC PANEL
ALT: 13 IU/L (ref 0–32)
AST: 17 IU/L (ref 0–40)
Albumin/Globulin Ratio: 1.4 (ref 1.2–2.2)
Albumin: 4.3 g/dL (ref 3.6–4.8)
Alkaline Phosphatase: 80 IU/L (ref 39–117)
BUN/Creatinine Ratio: 16 (ref 12–28)
BUN: 14 mg/dL (ref 8–27)
Bilirubin Total: 0.3 mg/dL (ref 0.0–1.2)
CO2: 23 mmol/L (ref 20–29)
Calcium: 9.6 mg/dL (ref 8.7–10.3)
Chloride: 102 mmol/L (ref 96–106)
Creatinine, Ser: 0.87 mg/dL (ref 0.57–1.00)
GFR, EST AFRICAN AMERICAN: 84 mL/min/{1.73_m2} (ref >59)
GFR, EST NON AFRICAN AMERICAN: 73 mL/min/{1.73_m2} (ref >59)
Globulin, Total: 3 g/dL (ref 1.5–4.5)
Glucose: 97 mg/dL (ref 65–99)
POTASSIUM: 4.2 mmol/L (ref 3.5–5.2)
Sodium: 142 mmol/L (ref 134–144)
TOTAL PROTEIN: 7.3 g/dL (ref 6.0–8.5)

## 2016-09-05 LAB — LIPID PANEL
CHOL/HDL RATIO: 4.2 ratio (ref 0.0–4.4)
Cholesterol, Total: 224 mg/dL — ABNORMAL HIGH (ref 100–199)
HDL: 53 mg/dL (ref >39)
LDL Calculated: 137 mg/dL — ABNORMAL HIGH (ref 0–99)
Triglycerides: 168 mg/dL — ABNORMAL HIGH (ref 0–149)
VLDL CHOLESTEROL CAL: 34 mg/dL (ref 5–40)

## 2016-09-05 LAB — T4 AND TSH
T4, Total: 8.4 ug/dL (ref 4.5–12.0)
TSH: 2.51 u[IU]/mL (ref 0.450–4.500)

## 2016-09-08 ENCOUNTER — Encounter: Payer: Self-pay | Admitting: Family Medicine

## 2016-09-12 ENCOUNTER — Other Ambulatory Visit: Payer: Self-pay | Admitting: Family Medicine

## 2016-09-12 DIAGNOSIS — Z1231 Encounter for screening mammogram for malignant neoplasm of breast: Secondary | ICD-10-CM

## 2016-10-03 ENCOUNTER — Other Ambulatory Visit: Payer: Self-pay | Admitting: Family Medicine

## 2016-10-03 DIAGNOSIS — I10 Essential (primary) hypertension: Secondary | ICD-10-CM

## 2016-10-03 DIAGNOSIS — E876 Hypokalemia: Secondary | ICD-10-CM

## 2016-10-07 ENCOUNTER — Encounter (INDEPENDENT_AMBULATORY_CARE_PROVIDER_SITE_OTHER): Payer: Self-pay | Admitting: Vascular Surgery

## 2016-10-07 ENCOUNTER — Ambulatory Visit (INDEPENDENT_AMBULATORY_CARE_PROVIDER_SITE_OTHER): Payer: BC Managed Care – PPO | Admitting: Vascular Surgery

## 2016-10-07 VITALS — BP 135/86 | HR 87 | Resp 17 | Ht 67.0 in | Wt 225.6 lb

## 2016-10-07 DIAGNOSIS — I83813 Varicose veins of bilateral lower extremities with pain: Secondary | ICD-10-CM

## 2016-10-07 DIAGNOSIS — E7800 Pure hypercholesterolemia, unspecified: Secondary | ICD-10-CM

## 2016-10-07 DIAGNOSIS — I10 Essential (primary) hypertension: Secondary | ICD-10-CM | POA: Diagnosis not present

## 2016-10-07 DIAGNOSIS — M7989 Other specified soft tissue disorders: Secondary | ICD-10-CM | POA: Insufficient documentation

## 2016-10-07 NOTE — Progress Notes (Signed)
Patient ID: Angela Villarreal, female   DOB: 1956/03/05, 61 y.o.   MRN: [id number]  Chief Complaint  Patient presents with  . Varicose Veins    HPI Angela Villarreal is a 61 y.o. female.  I am asked to see the patient by Dr. Caryn Section for evaluation of swelling and varicose veins of the leg.  The patient presents with complaints of symptomatic varicosities of the lower extremities. The patient reports a long standing history of varicosities and they have become painful over time. There was no clear inciting event or causative factor that started the symptoms as her veins of been present for over 30 years, but the swelling has gotten significantly worse after a significant fall and hematoma last fall. It took about 3 months for the bruising in swelling to completely go away from just the trauma, and following this there was stasis dermatitis and swelling of the right lower leg.  The right leg is more severly affected. The patient elevates the legs for relief. The pain is described as stinging and burning. The symptoms are generally most severe in the evening, particularly when they have been on their feet for long periods of time. Compression stockings has been used to try to improve the symptoms with some success. The patient complains of regular swelling as an associated symptom. The patient has no previous history of deep venous thrombosis or superficial thrombophlebitis to their knowledge.     Past Medical History:  Diagnosis Date  . Cancer ([place])    melanoma    Past Surgical History:  Procedure Laterality Date  . ABDOMINAL HYSTERECTOMY  2007   due to fibroids. patient reports she also had ovaries removed.   [facility] Kitchen BACK SURGERY  1989, 2000  . BLADDER SURGERY  2001   Tacked  . MELANOMA EXCISION  02/2004   removed from face    Family History  Problem Relation Age of Onset  . Colon cancer Mother   . Squamous cell carcinoma Mother   . Alzheimer's disease Mother   . Cancer Mother    colon  . Non-Hodgkin's lymphoma Father   . Thyroid disease Sister      Social History Social History  Substance Use Topics  . Smoking status: Never Smoker  . Smokeless tobacco: Never Used  . Alcohol use Yes     Comment: once monthly  No IV drug use  Allergies  Allergen Reactions  . Gentamicin Sulfate     Current Outpatient Prescriptions  Medication Sig Dispense Refill  . ADVAIR DISKUS 250-50 MCG/DOSE AEPB Inhale 1 puff into the lungs 2 (two) times daily. 180 each 3  . albuterol (PROVENTIL HFA;VENTOLIN HFA) 108 (90 Base) MCG/ACT inhaler Inhale 1-2 puffs into the lungs every 4 (four) hours as needed for wheezing or shortness of breath. 3 Inhaler 3  . cetirizine (ZYRTEC) 10 MG tablet Take by mouth.    . cyclobenzaprine (FLEXERIL) 5 MG tablet Take 1 tablet (5 mg total) by mouth 3 (three) times daily as needed for muscle spasms. 30 tablet 5  . estradiol (ESTRACE) 0.5 MG tablet Take 0.5 tablets (0.25 mg total) by mouth daily. 30 tablet 2  . FLUTICASONE PROPIONATE, NASAL, NA Place into the nose.    . hyoscyamine (LEVBID) 0.375 MG 12 hr tablet Take 1 tablet (0.375 mg total) by mouth 2 (two) times daily. 60 tablet 0  . montelukast (SINGULAIR) 10 MG tablet Take 1 tablet (10 mg total) by mouth at bedtime. 90 tablet 1  .  potassium chloride SA (K-DUR,KLOR-CON) 20 MEQ tablet TAKE ONE TABLET BY MOUTH TWICE DAILY 180 tablet 4  . SYNTHROID 125 MCG tablet TAKE ONE TABLET BY MOUTH EVERY DAY 90 tablet 3  . traMADol (ULTRAM) 50 MG tablet Take 1 tablet (50 mg total) by mouth every 6 (six) hours as needed. 28 tablet 1  . triamterene-hydrochlorothiazide (MAXZIDE-25) 37.5-25 MG tablet TAKE ONE TABLET BY MOUTH EVERY DAY 90 tablet 4  . PAZEO 0.7 % SOLN      No current facility-administered medications for this visit.       REVIEW OF SYSTEMS (Negative unless checked)  Constitutional: [] Weight loss  [] Fever  [] Chills Cardiac: [] Chest pain   [] Chest pressure   [] Palpitations   [] Shortness of breath  when laying flat   [] Shortness of breath at rest   [] Shortness of breath with exertion. Vascular:  [] Pain in legs with walking   [] Pain in legs at rest   [] Pain in legs when laying flat   [] Claudication   [] Pain in feet when walking  [] Pain in feet at rest  [] Pain in feet when laying flat   [] History of DVT   [] Phlebitis   [x] Swelling in legs   [x] Varicose veins   [] Non-healing ulcers Pulmonary:   [] Uses home oxygen   [] Productive cough   [] Hemoptysis   [] Wheeze  [] COPD   [] Asthma Neurologic:  [] Dizziness  [] Blackouts   [] Seizures   [] History of stroke   [] History of TIA  [] Aphasia   [] Temporary blindness   [] Dysphagia   [] Weakness or numbness in arms   [] Weakness or numbness in legs Musculoskeletal:  [] Arthritis   [] Joint swelling   [] Joint pain   [] Low back pain Hematologic:  [] Easy bruising  [] Easy bleeding   [] Hypercoagulable state   [] Anemic  [] Hepatitis Gastrointestinal:  [] Blood in stool   [] Vomiting blood  [] Gastroesophageal reflux/heartburn   [] Abdominal pain Genitourinary:  [] Chronic kidney disease   [] Difficult urination  [] Frequent urination  [] Burning with urination   [] Hematuria Skin:  [] Rashes   [] Ulcers   [] Wounds Psychological:  [] History of anxiety   []  History of major depression.    Physical Exam BP 135/86   Pulse 87   Resp 17   Ht 5\' 7"  (1.702 m)   Wt 225 lb 9.6 oz (102.3 kg)   BMI 35.33 kg/m  Gen:  WD/WN, NAD Head: [place]/AT, No temporalis wasting.  Ear/Nose/Throat: Hearing grossly intact, dentition good Eyes: Sclera non-icteric. Conjunctiva clear Neck: Supple, no nuchal rigidity. Trachea midline Pulmonary:  Good air movement, no use of accessory muscles, respirations not labored.  Cardiac: RRR, No JVD Vascular: Varicosities diffuse and measuring up to 3 mm in the right lower extremity        Varicosities diffuse and measuring up to 3-4 mm in the left lower extremity Vessel Right Left  Radial Palpable Palpable  Ulnar Palpable Palpable  Brachial Palpable Palpable    Carotid Palpable, without bruit Palpable, without bruit  Aorta Not palpable N/A  Femoral Palpable Palpable  Popliteal Palpable Palpable  PT Palpable Palpable  DP Palpable Palpable   Gastrointestinal: soft, non-tender/non-distended.  Musculoskeletal: M/S 5/5 throughout.   2 + RLE edema.  1 + LLE edema Neurologic: Sensation grossly intact in extremities.  Symmetrical.  Speech is fluent.  Psychiatric: Judgment intact, Mood & affect appropriate for pt's clinical situation. Dermatologic: No rashes or ulcers noted.  No cellulitis or open wounds.    Radiology No results found.  Labs Recent Results (from the past 2160 hour(s))  Comprehensive metabolic  panel     Status: None   Collection Time: 09/04/16 11:13 AM  Result Value Ref Range   Glucose 97 65 - 99 mg/dL   BUN 14 8 - 27 mg/dL   Creatinine, Ser 0.87 0.57 - 1.00 mg/dL   GFR calc non Af Amer 73 >59 mL/min/1.73   GFR calc Af Amer 84 >59 mL/min/1.73   BUN/Creatinine Ratio 16 12 - 28   Sodium 142 134 - 144 mmol/L   Potassium 4.2 3.5 - 5.2 mmol/L   Chloride 102 96 - 106 mmol/L   CO2 23 20 - 29 mmol/L    Comment:               **Please note reference interval change**   Calcium 9.6 8.7 - 10.3 mg/dL   Total Protein 7.3 6.0 - 8.5 g/dL   Albumin 4.3 3.6 - 4.8 g/dL   Globulin, Total 3.0 1.5 - 4.5 g/dL   Albumin/Globulin Ratio 1.4 1.2 - 2.2   Bilirubin Total 0.3 0.0 - 1.2 mg/dL   Alkaline Phosphatase 80 39 - 117 IU/L   AST 17 0 - 40 IU/L   ALT 13 0 - 32 IU/L  Lipid panel     Status: Abnormal   Collection Time: 09/04/16 11:13 AM  Result Value Ref Range   Cholesterol, Total 224 (H) 100 - 199 mg/dL   Triglycerides 168 (H) 0 - 149 mg/dL   HDL 53 >39 mg/dL   VLDL Cholesterol Cal 34 5 - 40 mg/dL   LDL Calculated 137 (H) 0 - 99 mg/dL   Chol/HDL Ratio 4.2 0.0 - 4.4 ratio    Comment:                                   T. Chol/HDL Ratio                                             Men  Women                               1/2 Avg.Risk   3.4    3.3                                   Avg.Risk  5.0    4.4                                2X Avg.Risk  9.6    7.1                                3X Avg.Risk 23.4   11.0   T4 AND TSH     Status: None   Collection Time: 09/04/16 11:13 AM  Result Value Ref Range   TSH 2.510 0.450 - 4.500 uIU/mL   T4, Total 8.4 4.5 - 12.0 ug/dL    Assessment/Plan:  Benign essential HTN blood pressure control important in reducing the progression of atherosclerotic disease. On appropriate oral medications.   Hypercholesteremia lipid control important in reducing the progression of atherosclerotic disease.  Swelling of limb It sounds like she may have a component of some lymphedema after the trauma to her leg, but clearly venous disease is playing a contributing factor as well. Compression stockings, elevation, and venous workup is planned below. We will assess for the need for a lymphedema pump after evaluation of her venous disease.  Varicose veins of bilateral lower extremities with pain See below    The patient has symptoms consistent with chronic venous insufficiency. We discussed the natural history and treatment options for venous disease. I recommended the regular use of 20 - 30 mm Hg compression stockings, and prescribed these today. I recommended leg elevation and anti-inflammatories as needed for pain. I have also recommended a complete venous duplex to assess the venous system for reflux or thrombotic issues. This can be done at the patient's convenience. I will see the patient back in 3 months to assess the response to conservative management, and determine further treatment options.     Leotis Pain 10/07/2016, 4:07 PM   This note was created with Dragon medical transcription system.  Any errors from dictation are unintentional.

## 2016-10-07 NOTE — Assessment & Plan Note (Signed)
It sounds like she may have a component of some lymphedema after the trauma to her leg, but clearly venous disease is playing a contributing factor as well. Compression stockings, elevation, and venous workup is planned below. We will assess for the need for a lymphedema pump after evaluation of her venous disease.

## 2016-10-07 NOTE — Assessment & Plan Note (Signed)
See below

## 2016-10-07 NOTE — Assessment & Plan Note (Signed)
lipid control important in reducing the progression of atherosclerotic disease.   

## 2016-10-07 NOTE — Patient Instructions (Signed)
Varicose Veins Varicose veins are veins that have become enlarged and twisted. They are usually seen in the legs but can occur in other parts of the body as well. What are the causes? This condition is the result of valves in the veins not working properly. Valves in the veins help to return blood from the leg to the heart. If these valves are damaged, blood flows backward and backs up into the veins in the leg near the skin. This causes the veins to become larger. What increases the risk? People who are on their feet a lot, who are pregnant, or who are overweight are more likely to develop varicose veins. What are the signs or symptoms?  Bulging, twisted-appearing, bluish veins, most commonly found on the legs.  Leg pain or a feeling of heaviness. These symptoms may be worse at the end of the day.  Leg swelling.  Changes in skin color. How is this diagnosed? A health care provider can usually diagnose varicose veins by examining your legs. Your health care provider may also recommend an ultrasound of your leg veins. How is this treated? Most varicose veins can be treated at home.However, other treatments are available for people who have persistent symptoms or want to improve the cosmetic appearance of the varicose veins. These treatment options include:  Sclerotherapy. A solution is injected into the vein to close it off.  Laser treatment. A laser is used to heat the vein to close it off.  Radiofrequency vein ablation. An electrical current produced by radio waves is used to close off the vein.  Phlebectomy. The vein is surgically removed through small incisions made over the varicose vein.  Vein ligation and stripping. The vein is surgically removed through incisions made over the varicose vein after the vein has been tied (ligated). Follow these instructions at home:   Do not stand or sit in one position for long periods of time. Do not sit with your legs crossed. Rest with your  legs raised during the day.  Wear compression stockings as directed by your health care provider. These stockings help to prevent blood clots and reduce swelling in your legs.  Do not wear other tight, encircling garments around your legs, pelvis, or waist.  Walk as much as possible to increase blood flow.  Raise the foot of your bed at night with 2-inch blocks.  If you get a cut in the skin over the vein and the vein bleeds, lie down with your leg raised and press on it with a clean cloth until the bleeding stops. Then place a bandage (dressing) on the cut. See your health care provider if it continues to bleed. Contact a health care provider if:  The skin around your ankle starts to break down.  You have pain, redness, tenderness, or hard swelling in your leg over a vein.  You are uncomfortable because of leg pain. This information is not intended to replace advice given to you by your health care provider. Make sure you discuss any questions you have with your health care provider. Document Released: 12/11/2004 Document Revised: 08/09/2015 Document Reviewed: 09/04/2015 Elsevier Interactive Patient Education  2017 [employer]  

## 2016-10-07 NOTE — Assessment & Plan Note (Signed)
blood pressure control important in reducing the progression of atherosclerotic disease. On appropriate oral medications.  

## 2016-10-22 ENCOUNTER — Ambulatory Visit
Admission: RE | Admit: 2016-10-22 | Discharge: 2016-10-22 | Disposition: A | Discharge status: Home / Self Care | Payer: BC Managed Care – PPO | Source: Ambulatory Visit | Attending: Family Medicine | Admitting: Family Medicine

## 2016-10-22 DIAGNOSIS — Z1231 Encounter for screening mammogram for malignant neoplasm of breast: Secondary | ICD-10-CM | POA: Diagnosis not present

## 2017-01-06 ENCOUNTER — Ambulatory Visit (INDEPENDENT_AMBULATORY_CARE_PROVIDER_SITE_OTHER): Payer: BC Managed Care – PPO | Admitting: Family Medicine

## 2017-01-06 ENCOUNTER — Encounter: Payer: Self-pay | Admitting: Family Medicine

## 2017-01-06 VITALS — BP 126/86 | HR 92 | Temp 98.4°F | Resp 16 | Ht 66.0 in | Wt 229.0 lb

## 2017-01-06 DIAGNOSIS — N3091 Cystitis, unspecified with hematuria: Secondary | ICD-10-CM | POA: Diagnosis not present

## 2017-01-06 DIAGNOSIS — R309 Painful micturition, unspecified: Secondary | ICD-10-CM

## 2017-01-06 LAB — POCT URINALYSIS DIPSTICK
Bilirubin, UA: NEGATIVE
Glucose, UA: NEGATIVE
Ketones, UA: NEGATIVE
Nitrite, UA: NEGATIVE
PH UA: 6 (ref 5.0–8.0)
PROTEIN UA: NEGATIVE
Spec Grav, UA: 1.02 (ref 1.010–1.025)
Urobilinogen, UA: 0.2 E.U./dL

## 2017-01-06 MED ORDER — CEPHALEXIN 500 MG PO CAPS: 500.0000 mg | ORAL_CAPSULE | Freq: Two times a day (BID) | ORAL | 0 refills | Status: DC

## 2017-01-06 NOTE — Patient Instructions (Signed)
We will call you with the lab results. 

## 2017-01-06 NOTE — Progress Notes (Signed)
Subjective:     Patient ID: Angela Villarreal, female   DOB: 05-24-1955, 61 y.o.   MRN: [id number]  HPI  Chief Complaint  Patient presents with  . Urinary Tract Infection    Patient here today C/O painful, burning and pressure with urination. Patient reports blood in her underwear and not visiable in urine. Patient reports symptoms have been present since this morning. Patient denies back pain or fever.  States last UTI was about two years ago.   Review of Systems     Objective:   Physical Exam  Constitutional: She appears well-developed and well-nourished. No distress.  Genitourinary:  Genitourinary Comments: No cva tenderness       Assessment:    1. Painful urination - POCT urinalysis dipstick  2. Cystitis with hematuria - Urine Culture - cephALEXin (KEFLEX) 500 MG capsule; Take 1 capsule (500 mg total) by mouth 2 (two) times daily.  Dispense: 14 capsule; Refill: 0    Plan:    Further f/u pending urine culture.

## 2017-01-08 ENCOUNTER — Telehealth: Payer: Self-pay

## 2017-01-08 LAB — URINE CULTURE
MICRO NUMBER: 81185426
SPECIMEN QUALITY:: ADEQUATE

## 2017-01-08 NOTE — Telephone Encounter (Signed)
-----   Message from Carmon Ginsberg, [place] sent at 01/08/2017  7:35 AM EDT ----- No specific organism detected. Is antibiotic helping?

## 2017-01-08 NOTE — Telephone Encounter (Signed)
[id number] 

## 2017-01-09 ENCOUNTER — Encounter (INDEPENDENT_AMBULATORY_CARE_PROVIDER_SITE_OTHER): Payer: Self-pay | Admitting: Vascular Surgery

## 2017-01-09 ENCOUNTER — Ambulatory Visit (INDEPENDENT_AMBULATORY_CARE_PROVIDER_SITE_OTHER): Payer: BC Managed Care – PPO | Admitting: Vascular Surgery

## 2017-01-09 ENCOUNTER — Ambulatory Visit (INDEPENDENT_AMBULATORY_CARE_PROVIDER_SITE_OTHER): Payer: BC Managed Care – PPO

## 2017-01-09 VITALS — BP 160/88 | HR 86 | Resp 17 | Wt 226.0 lb

## 2017-01-09 DIAGNOSIS — E7800 Pure hypercholesterolemia, unspecified: Secondary | ICD-10-CM | POA: Diagnosis not present

## 2017-01-09 DIAGNOSIS — I10 Essential (primary) hypertension: Secondary | ICD-10-CM | POA: Diagnosis not present

## 2017-01-09 DIAGNOSIS — I83813 Varicose veins of bilateral lower extremities with pain: Secondary | ICD-10-CM

## 2017-01-09 NOTE — Progress Notes (Signed)
MRN : [id number]  Angela Villarreal is a 61 y.o. (Aug 23, 1955) female who presents with chief complaint of  Chief Complaint  Patient presents with  . Follow-up    66mo bil ven reflux  .  History of Present Illness: Patient returns today in follow up of venous disease and swelling. She did not like the stockings much but exercise has really helped her pain and swelling as well as the stasis changes.  Venous duplex demonstrates bilateral deep venous reflux with right great saphenous vein reflux.        Past Medical History:  Diagnosis Date  . Cancer ([place])    melanoma         Past Surgical History:  Procedure Laterality Date  . ABDOMINAL HYSTERECTOMY  2007   due to fibroids. patient reports she also had ovaries removed.   Marland Kitchen BACK SURGERY  1989, 2000  . BLADDER SURGERY  2001   Tacked  . MELANOMA EXCISION  02/2004   removed from face         Family History  Problem Relation Age of Onset  . Colon cancer Mother   . Squamous cell carcinoma Mother   . Alzheimer's disease Mother   . Cancer Mother        colon  . Non-Hodgkin's lymphoma Father   . Thyroid disease Sister      Social History        Social History   Substance Use Topics   . Smoking status: Never Smoker   . Smokeless tobacco: Never Used   . Alcohol use Yes     Comment: once monthly   No IV drug use      Allergies  Allergen Reactions  . Gentamicin Sulfate           Current Outpatient Prescriptions  Medication Sig Dispense Refill  . ADVAIR DISKUS 250-50 MCG/DOSE AEPB Inhale 1 puff into the lungs 2 (two) times daily. 180 each 3  . albuterol (PROVENTIL HFA;VENTOLIN HFA) 108 (90 Base) MCG/ACT inhaler Inhale 1-2 puffs into the lungs every 4 (four) hours as needed for wheezing or shortness of breath. 3 Inhaler 3  . cetirizine (ZYRTEC) 10 MG tablet Take by mouth.    . cyclobenzaprine (FLEXERIL) 5 MG tablet Take 1 tablet (5 mg total) by mouth 3 (three) times daily as needed  for muscle spasms. 30 tablet 5  . estradiol (ESTRACE) 0.5 MG tablet Take 0.5 tablets (0.25 mg total) by mouth daily. 30 tablet 2  . FLUTICASONE PROPIONATE, NASAL, NA Place into the nose.    . hyoscyamine (LEVBID) 0.375 MG 12 hr tablet Take 1 tablet (0.375 mg total) by mouth 2 (two) times daily. 60 tablet 0  . montelukast (SINGULAIR) 10 MG tablet Take 1 tablet (10 mg total) by mouth at bedtime. 90 tablet 1  . potassium chloride SA (K-DUR,KLOR-CON) 20 MEQ tablet TAKE ONE TABLET BY MOUTH TWICE DAILY 180 tablet 4  . SYNTHROID 125 MCG tablet TAKE ONE TABLET BY MOUTH EVERY DAY 90 tablet 3  . traMADol (ULTRAM) 50 MG tablet Take 1 tablet (50 mg total) by mouth every 6 (six) hours as needed. 28 tablet 1  . triamterene-hydrochlorothiazide (MAXZIDE-25) 37.5-25 MG tablet TAKE ONE TABLET BY MOUTH EVERY DAY 90 tablet 4  . PAZEO 0.7 % SOLN      No current facility-administered medications for this visit.       REVIEW OF SYSTEMS (Negative unless checked)  Constitutional: [] Weight loss  [] Fever  [] Chills Cardiac: [] Chest  pain   [] Chest pressure   [] Palpitations   [] Shortness of breath when laying flat   [] Shortness of breath at rest   [] Shortness of breath with exertion. Vascular:  [] Pain in legs with walking   [] Pain in legs at rest   [] Pain in legs when laying flat   [] Claudication   [] Pain in feet when walking  [] Pain in feet at rest  [] Pain in feet when laying flat   [] History of DVT   [] Phlebitis   [x] Swelling in legs   [x] Varicose veins   [] Non-healing ulcers Pulmonary:   [] Uses home oxygen   [] Productive cough   [] Hemoptysis   [] Wheeze  [] COPD   [] Asthma Neurologic:  [] Dizziness  [] Blackouts   [] Seizures   [] History of stroke   [] History of TIA  [] Aphasia   [] Temporary blindness   [] Dysphagia   [] Weakness or numbness in arms   [] Weakness or numbness in legs Musculoskeletal:  [] Arthritis   [] Joint swelling   [] Joint pain   [] Low back pain Hematologic:  [] Easy bruising  [] Easy bleeding    [] Hypercoagulable state   [] Anemic  [] Hepatitis Gastrointestinal:  [] Blood in stool   [] Vomiting blood  [] Gastroesophageal reflux/heartburn   [] Abdominal pain Genitourinary:  [] Chronic kidney disease   [] Difficult urination  [] Frequent urination  [] Burning with urination   [] Hematuria Skin:  [] Rashes   [] Ulcers   [] Wounds Psychological:  [] History of anxiety   []  History of major depression.   Physical Examination  BP (!) 160/88 (BP Location: Right Arm)   Pulse 86   Resp 17   Wt 102.5 kg (226 lb)   BMI 36.48 kg/m  Gen:  WD/WN, NAD Head: [place]/AT, No temporalis wasting. Ear/Nose/Throat: Hearing grossly intact, nares w/o erythema or drainage, trachea midline Eyes: Conjunctiva clear. Sclera non-icteric Neck: Supple.  No JVD.  Pulmonary:  Good air movement, no use of accessory muscles.  Cardiac: RRR, normal S1, S2 Vascular:  Vessel Right Left  Radial Palpable Palpable  Ulnar Palpable Palpable  Brachial Palpable Palpable  Carotid Palpable, without bruit Palpable, without bruit  Aorta Not palpable N/A  Femoral Palpable Palpable  Popliteal Palpable Palpable  PT Palpable Palpable  DP Palpable Palpable    Musculoskeletal: M/S 5/5 throughout.  No deformity or atrophy.  Minimal stasis changes are now present on the right with trace right lower extremity edema.  No significant left lower extremity edema Neurologic: Sensation grossly intact in extremities.  Symmetrical.  Speech is fluent.  Psychiatric: Judgment intact, Mood & affect appropriate for pt's clinical situation. Dermatologic: No rashes or ulcers noted.  No cellulitis or open wounds.       Labs Recent Results (from the past 2160 hour(s))  POCT urinalysis dipstick     Status: Abnormal   Collection Time: 01/06/17  2:17 PM  Result Value Ref Range   Color, UA yellow    Clarity, UA cloudy    Glucose, UA negative    Bilirubin, UA negative    Ketones, UA negative    Spec Grav, UA 1.020 1.010 - 1.025   Blood, UA moderate     pH, UA 6.0 5.0 - 8.0   Protein, UA negative    Urobilinogen, UA 0.2 0.2 or 1.0 E.U./dL   Nitrite, UA negative    Leukocytes, UA Moderate (2+) (A) Negative  Urine Culture     Status: None   Collection Time: 01/06/17  2:37 PM  Result Value Ref Range   MICRO NUMBER: [id number]    SPECIMEN QUALITY: ADEQUATE  Sample Source URINE    STATUS: FINAL    Result:      Three or more organisms present, each greater than 10,000 cu/mL. May represent normal flora contamination from external genitalia. No further testing is required.    Radiology No results found.    Assessment/Plan Benign essential HTN blood pressure control important in reducing the progression of atherosclerotic disease. On appropriate oral medications.   Hypercholesteremia lipid control important in reducing the progression of atherosclerotic disease.     Swelling of limb It sounds like she may have a component of some lymphedema after the trauma to her leg, but clearly venous disease is playing a contributing factor as well. Compression stockings, elevation were limited in their effectiveness.  However, exercise has markedly helped.  Varicose veins of bilateral lower extremities with pain Venous duplex demonstrates bilateral deep venous reflux with right great saphenous vein reflux.  With marked improvement in her symptoms, no intervention would be planned at current.  She will contact our office with worsening symptoms. No problem-specific Assessment & Plan notes found for this encounter.    Leotis Pain, MD  01/09/2017 4:10 PM    This note was created with Dragon medical transcription system.  Any errors from dictation are purely unintentional

## 2017-01-13 ENCOUNTER — Other Ambulatory Visit: Payer: Self-pay | Admitting: Family Medicine

## 2017-01-13 DIAGNOSIS — E039 Hypothyroidism, unspecified: Secondary | ICD-10-CM

## 2017-01-13 NOTE — Telephone Encounter (Signed)
[id number] 

## 2017-01-13 NOTE — Telephone Encounter (Signed)
Pt returned call. States she feels somewhat improved, but still has a bladder pressure. No dysuria, frequency. States she still has some of the antibiotics left. Should she complete the abx? Please advise.

## 2017-01-13 NOTE — Telephone Encounter (Signed)
Yes, complete the full course.

## 2017-01-15 NOTE — Telephone Encounter (Signed)
[id number] 

## 2017-01-16 ENCOUNTER — Encounter: Payer: Self-pay | Admitting: Family Medicine

## 2017-01-16 ENCOUNTER — Ambulatory Visit (INDEPENDENT_AMBULATORY_CARE_PROVIDER_SITE_OTHER): Payer: BC Managed Care – PPO | Admitting: Family Medicine

## 2017-01-16 VITALS — BP 124/80 | HR 80 | Temp 99.3°F | Resp 16 | Wt 226.2 lb

## 2017-01-16 DIAGNOSIS — S66911A Strain of unspecified muscle, fascia and tendon at wrist and hand level, right hand, initial encounter: Secondary | ICD-10-CM | POA: Diagnosis not present

## 2017-01-16 DIAGNOSIS — R1030 Lower abdominal pain, unspecified: Secondary | ICD-10-CM

## 2017-01-16 LAB — POCT URINALYSIS DIPSTICK
BILIRUBIN UA: NEGATIVE
GLUCOSE UA: NEGATIVE
Ketones, UA: NEGATIVE
Leukocytes, UA: NEGATIVE
Nitrite, UA: NEGATIVE
Protein, UA: NEGATIVE
Urobilinogen, UA: 0.2 E.U./dL
pH, UA: 8 (ref 5.0–8.0)

## 2017-01-16 NOTE — Patient Instructions (Signed)
Ice hand for 20 minutes several x day. Add two Aleve twice daily with food

## 2017-01-16 NOTE — Progress Notes (Signed)
Subjective:     Patient ID: Angela Villarreal, female   DOB: 02/24/1956, 61 y.o.   MRN: [id number]  HPI  Chief Complaint  Patient presents with  . Follow-up    Patient states that she is returning to office today to see if urinary infection had cleared, patient reports still have some abdominal pressure.   . Arthritis    Patient would like to address pain with arthritis in her right hand for the past month.   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 States she has two more weeks of work to go. She taken Aleve sporadically and used her opposite hand as much as possible.   Review of Systems     Objective:   Physical Exam  Constitutional: She appears well-developed and well-nourished. No distress.  Musculoskeletal:  Dorsum of right hand mildly swollen without erythema. Grip strength 5/5. Phalen's and Finklestein's negative. No apparent MCP joint swelling.       Assessment:    1. Lower abdominal pain; cystitis resolved - POCT urinalysis dipstick  2. Overuse syndrome of right hand, initial encounter    Plan:    Discussed use of icing and scheduling Aleve twice daily with food.

## 2017-01-19 NOTE — Telephone Encounter (Signed)
[id number] 

## 2017-01-23 NOTE — Telephone Encounter (Signed)
No need for further call back -she came in to the office for a repeat U/A on 11/2.

## 2017-01-23 NOTE — Telephone Encounter (Signed)
[id number] 

## 2017-03-23 ENCOUNTER — Encounter: Payer: Self-pay | Admitting: Family Medicine

## 2017-03-23 ENCOUNTER — Ambulatory Visit: Payer: BC Managed Care – PPO | Admitting: Family Medicine

## 2017-03-23 VITALS — BP 142/80 | HR 88 | Temp 99.4°F | Resp 16 | Wt 223.0 lb

## 2017-03-23 DIAGNOSIS — J320 Chronic maxillary sinusitis: Secondary | ICD-10-CM

## 2017-03-23 DIAGNOSIS — R05 Cough: Secondary | ICD-10-CM | POA: Diagnosis not present

## 2017-03-23 DIAGNOSIS — R059 Cough, unspecified: Secondary | ICD-10-CM

## 2017-03-23 MED ORDER — HYDROCODONE-HOMATROPINE 5-1.5 MG/5ML PO SYRP: 5.0000 mL | ORAL_SOLUTION | Freq: Three times a day (TID) | ORAL | 0 refills | Status: DC | PRN

## 2017-03-23 MED ORDER — AZITHROMYCIN 250 MG PO TABS: ORAL_TABLET | ORAL | 0 refills | Status: AC

## 2017-03-23 NOTE — Progress Notes (Signed)
Patient: Angela Villarreal Female    DOB: 1955/07/24   62 y.o.   MRN: [id number] Visit Date: 03/23/2017  Today's Provider: Lelon Huh, MD   Chief Complaint  Patient presents with  . URI   Subjective:    URI   This is a new problem. The current episode started 1 to 4 weeks ago (about 10 days). The problem has been unchanged. There has been no fever. Associated symptoms include chest pain, congestion, coughing, headaches, a plugged ear sensation, rhinorrhea, sinus pain and a sore throat. She has tried acetaminophen, antihistamine and decongestant for the symptoms. The treatment provided mild relief.   Patient has used Mucinex and Delsym with minimal relief. She has also used her rescue inhaler while she was sick and it seemed to get a little better with her breathing. States had mostly resoled 3-4 days ago, but returned even worse the last 2 days.     Allergies  Allergen Reactions  . Gentamicin Sulfate      Current Outpatient Medications:  .  ADVAIR DISKUS 250-50 MCG/DOSE AEPB, Inhale 1 puff into the lungs 2 (two) times daily., Disp: 180 each, Rfl: 3 .  albuterol (PROVENTIL HFA;VENTOLIN HFA) 108 (90 Base) MCG/ACT inhaler, Inhale 1-2 puffs into the lungs every 4 (four) hours as needed for wheezing or shortness of breath., Disp: 3 Inhaler, Rfl: 3 .  cetirizine (ZYRTEC) 10 MG tablet, Take by mouth., Disp: , Rfl:  .  FLUTICASONE PROPIONATE, NASAL, NA, Place into the nose., Disp: , Rfl:  .  hyoscyamine (LEVBID) 0.375 MG 12 hr tablet, Take 1 tablet (0.375 mg total) by mouth 2 (two) times daily., Disp: 60 tablet, Rfl: 0 .  montelukast (SINGULAIR) 10 MG tablet, Take 1 tablet (10 mg total) by mouth at bedtime., Disp: 90 tablet, Rfl: 1 .  potassium chloride SA (K-DUR,KLOR-CON) 20 MEQ tablet, TAKE ONE TABLET BY MOUTH TWICE DAILY, Disp: 180 tablet, Rfl: 4 .  SYNTHROID 125 MCG tablet, TAKE ONE TABLET EVERY DAY, Disp: 90 tablet, Rfl: 4 .  triamterene-hydrochlorothiazide (MAXZIDE-25) 37.5-25  MG tablet, TAKE ONE TABLET BY MOUTH EVERY DAY, Disp: 90 tablet, Rfl: 4  Review of Systems  HENT: Positive for congestion, rhinorrhea, sinus pain and sore throat.   Respiratory: Positive for cough.   Cardiovascular: Positive for chest pain.  Neurological: Positive for headaches.    Social History   Tobacco Use  . Smoking status: Never Smoker  . Smokeless tobacco: Never Used  Substance Use Topics  . Alcohol use: Yes    Comment: once monthly   Objective:   BP (!) 142/80 (BP Location: Right Arm, Patient Position: Sitting, Cuff Size: Large)   Pulse 88   Temp 99.4 F (37.4 C)   Resp 16   Wt 223 lb (101.2 kg)   SpO2 99%   BMI 35.99 kg/m  Vitals:   03/23/17 1447  BP: (!) 142/80  Pulse: 88  Resp: 16  Temp: 99.4 F (37.4 C)  SpO2: 99%  Weight: 223 lb (101.2 kg)     Physical Exam  General Appearance:    Alert, cooperative, no distress  HENT:   ENT exam normal, no neck nodes or sinus tenderness, bilateral TM fluid noted, neck without nodes, fronal an  sinus tender and nasal mucosa pale and congested  Eyes:    PERRL, conjunctiva/corneas clear, EOM's intact       Lungs:     Clear to auscultation bilaterally, respirations unlabored  Heart:    Regular  rate and rhythm  Neurologic:   Awake, alert, oriented x 3. No apparent focal neurological           defect.           Assessment & Plan:     1. Maxillary sinusitis, unspecified chronicity  - azithromycin (ZITHROMAX) 250 MG tablet; 2 by mouth today, then 1 daily for 4 days  Dispense: 6 tablet; Refill: 0  2. Cough  - HYDROcodone-homatropine (HYCODAN) 5-1.5 MG/5ML syrup; Take 5 mLs by mouth every 8 (eight) hours as needed for cough.  Dispense: 100 mL; Refill: 0       Lelon Huh, MD  [facility]

## 2017-06-29 ENCOUNTER — Ambulatory Visit: Payer: BC Managed Care – PPO | Admitting: Family Medicine

## 2017-06-29 ENCOUNTER — Encounter: Payer: Self-pay | Admitting: Family Medicine

## 2017-06-29 VITALS — BP 164/98 | HR 79 | Temp 98.8°F | Wt 220.4 lb

## 2017-06-29 DIAGNOSIS — J301 Allergic rhinitis due to pollen: Secondary | ICD-10-CM

## 2017-06-29 DIAGNOSIS — J40 Bronchitis, not specified as acute or chronic: Secondary | ICD-10-CM | POA: Diagnosis not present

## 2017-06-29 DIAGNOSIS — I10 Essential (primary) hypertension: Secondary | ICD-10-CM

## 2017-06-29 MED ORDER — AZITHROMYCIN 250 MG PO TABS: ORAL_TABLET | ORAL | 0 refills | Status: DC

## 2017-06-29 NOTE — Progress Notes (Signed)
Patient: Angela Villarreal Female    DOB: Dec 05, 1955   62 y.o.   MRN: [id number] Visit Date: 06/29/2017  Today's Provider: Vernie Murders, PA   Chief Complaint  Patient presents with  . Cough  . Headache   Subjective:    URI   This is a new problem. Episode onset: 2 weeks ago. The problem has been gradually improving. Associated symptoms include coughing and headaches. Pertinent negatives include no wheezing. Treatments tried: Mucinex, allergy medication, Steroid and Antibiotics. The treatment provided mild relief.  Patient was seen at Urgent Care on 06/20/17. She was prescribed Prednisone and Doxycycline. Patient completed both medications yesterday. Patient states she still has a productive cough and headache.    Past Medical History:  Diagnosis Date  . Cancer ([employer])    melanoma   Past Surgical History:  Procedure Laterality Date  . ABDOMINAL HYSTERECTOMY  2007   due to fibroids. patient reports she also had ovaries removed.   Marland Kitchen BACK SURGERY  1989, 2000  . BLADDER SURGERY  2001   Tacked  . MELANOMA EXCISION  02/2004   removed from face   Family History  Problem Relation Age of Onset  . Colon cancer Mother   . Squamous cell carcinoma Mother   . Alzheimer's disease Mother   . Cancer Mother        colon  . Non-Hodgkin's lymphoma Father   . Thyroid disease Sister   . Breast cancer Neg Hx    Allergies  Allergen Reactions  . Gentamicin Sulfate     Current Outpatient Medications:  .  ADVAIR DISKUS 250-50 MCG/DOSE AEPB, Inhale 1 puff into the lungs 2 (two) times daily., Disp: 180 each, Rfl: 3 .  albuterol (PROVENTIL HFA;VENTOLIN HFA) 108 (90 Base) MCG/ACT inhaler, Inhale 1-2 puffs into the lungs every 4 (four) hours as needed for wheezing or shortness of breath., Disp: 3 Inhaler, Rfl: 3 .  cetirizine (ZYRTEC) 10 MG tablet, Take by mouth., Disp: , Rfl:  .  FLUTICASONE PROPIONATE, NASAL, NA, Place into the nose., Disp: , Rfl:  .  hyoscyamine (LEVBID) 0.375 MG 12 hr  tablet, Take 1 tablet (0.375 mg total) by mouth 2 (two) times daily., Disp: 60 tablet, Rfl: 0 .  montelukast (SINGULAIR) 10 MG tablet, Take 1 tablet (10 mg total) by mouth at bedtime., Disp: 90 tablet, Rfl: 1 .  potassium chloride SA (K-DUR,KLOR-CON) 20 MEQ tablet, TAKE ONE TABLET BY MOUTH TWICE DAILY, Disp: 180 tablet, Rfl: 4 .  SYNTHROID 125 MCG tablet, TAKE ONE TABLET EVERY DAY, Disp: 90 tablet, Rfl: 4 .  triamterene-hydrochlorothiazide (MAXZIDE-25) 37.5-25 MG tablet, TAKE ONE TABLET BY MOUTH EVERY DAY, Disp: 90 tablet, Rfl: 4  Review of Systems  Constitutional: Negative.   Respiratory: Positive for cough. Negative for wheezing.   Cardiovascular: Negative.   Neurological: Positive for headaches.   Social History   Tobacco Use  . Smoking status: Never Smoker  . Smokeless tobacco: Never Used  Substance Use Topics  . Alcohol use: Yes    Comment: once monthly   Objective:   BP (!) 172/102 (BP Location: Right Arm, Patient Position: Sitting, Cuff Size: Normal)   Pulse 79   Temp 98.8 F (37.1 C) (Oral)   Wt 220 lb 6.4 oz (100 kg)   SpO2 97%   BMI 35.57 kg/m   Physical Exam  Constitutional: She is oriented to person, place, and time. She appears well-developed and well-nourished. No distress.  HENT:  Head: Normocephalic  and atraumatic.  Right Ear: Hearing normal.  Left Ear: Hearing normal.  Nose: Nose normal.  Good transillumination of sinuses. No tenderness.  Eyes: Conjunctivae, EOM and lids are normal. Right eye exhibits no discharge. Left eye exhibits no discharge. No scleral icterus.  Neck: Normal range of motion.  Cardiovascular: Normal rate and regular rhythm.  Pulmonary/Chest: Effort normal. No respiratory distress.  Abdominal: Soft.  Musculoskeletal: Normal range of motion.  Neurological: She is alert and oriented to person, place, and time.  Skin: Skin is intact. No lesion noted.  Psychiatric: She has a normal mood and affect. Her speech is normal and behavior is  normal. Thought content normal.      Assessment & Plan:     1. Bronchitis Developed cough and congestion 2-3 weeks ago. Went to an Urgent [facility] and treated with prednisone, Doxycycline and a nebulizer treatment for wheezing on 06-20-17. Feeling better but still some cough with sputum production. Using Advair BID and occasionally Albuterol prn rescue. Will treat with the Z-pak and recheck in 2 weeks. History of reactive airway disease and allergies that she sees an allergist about occasionally. - azithromycin (ZITHROMAX) 250 MG tablet; Take 2 tablets by mouth the first day then one daily for 4 days.  Dispense: 6 tablet; Refill: 0  2. Seasonal allergic rhinitis due to pollen Still taking the Flonase, Zyrtec and Singulair regularly. Recheck prn.  3. Essential hypertension High BP today. No chest pains, dyspnea or palpitations. May be up due to recent illness and increase in salty foods in diet. Recommend restricting caffeine, sodium and decongestants. Recheck of BP today came down to 164/98. Recheck BP in 2 weeks with nurse.       Vernie Murders, PA  [facility]

## 2017-09-30 ENCOUNTER — Other Ambulatory Visit: Payer: Self-pay | Admitting: Family Medicine

## 2017-09-30 DIAGNOSIS — Z1231 Encounter for screening mammogram for malignant neoplasm of breast: Secondary | ICD-10-CM

## 2017-10-20 ENCOUNTER — Encounter: Payer: BC Managed Care – PPO | Admitting: Family Medicine

## 2017-10-27 ENCOUNTER — Ambulatory Visit
Admission: RE | Admit: 2017-10-27 | Discharge: 2017-10-27 | Disposition: A | Discharge status: Home / Self Care | Payer: BC Managed Care – PPO | Source: Ambulatory Visit | Attending: Family Medicine | Admitting: Family Medicine

## 2017-10-27 DIAGNOSIS — Z1231 Encounter for screening mammogram for malignant neoplasm of breast: Secondary | ICD-10-CM | POA: Diagnosis present

## 2017-11-02 NOTE — Progress Notes (Signed)
Patient: Angela Villarreal, Female    DOB: 10/22/1955, 62 y.o.   MRN: [id number] Visit Date: 11/03/2017  Today's Provider: Lelon Huh, MD   Chief Complaint  Patient presents with  . Annual Exam  . Hypertension  . Hyperlipidemia   Subjective:    Annual physical exam Angela Villarreal is a 62 y.o. female who presents today for health maintenance and complete physical. She feels well. She reports exercising some. She reports she is sleeping fairly well. She continues to follow up with Dr. Nehemiah Massed yearly for skin exam.   ---------------------------------------------------------------    Hypertension, follow-up:  BP Readings from Last 3 Encounters:  11/03/17 125/76  06/29/17 (!) 164/98  03/23/17 (!) 142/80    She was last seen for hypertension 09/04/2016.  BP at that visit was 132/72. Management since that visit includes; no changes.She reports good compliance with treatment. She is not having side effects. none She is exercising. She is adherent to low salt diet.   Outside blood pressures are normal. She is experiencing none.  Patient denies none.   Cardiovascular risk factors include none.  Use of agents associated with hypertension: none.   ---------------------------------------------------------------     Lipid/Cholesterol, Follow-up:   Last seen for this 09/04/2016.  Management since that visit includes; labs checked, no changes.  Last Lipid Panel:    Component Value Date/Time   CHOL 224 (H) 09/04/2016 1113   TRIG 168 (H) 09/04/2016 1113   HDL 53 09/04/2016 1113   CHOLHDL 4.2 09/04/2016 1113   LDLCALC 137 (H) 09/04/2016 1113    She reports good compliance with treatment. She is not having side effects. none  Wt Readings from Last 3 Encounters:  11/03/17 218 lb (98.9 kg)  06/29/17 220 lb 6.4 oz (100 kg)  03/23/17 223 lb (101.2 kg)    ---------------------------------------------------------------  Adult hypothyroidism From  09/04/2016-labs checked, no changes.  Lab Results  Component Value Date   TSH 2.510 09/04/2016    Irritable bowel syndrome, unspecified type From 09/04/2016-no changes. Takes occasionally hyoscyamine which is effective She does have family history colon tumor in her mother and is due for 5 year colonoscopy.   Review of Systems  Gastrointestinal: Positive for constipation and diarrhea.       Due to IBS  Musculoskeletal: Positive for arthralgias, myalgias and neck pain.  Allergic/Immunologic: Positive for environmental allergies.  Hematological: Bruises/bleeds easily.  All other systems reviewed and are negative.   Social History      She  reports that she has never smoked. She has never used smokeless tobacco. She reports that she drinks alcohol. She reports that she does not use drugs.       Social History   Socioeconomic History  . Marital status: Divorced    Spouse name: Not on file  . Number of children: Not on file  . Years of education: Not on file  . Highest education level: Not on file  Occupational History  . Not on file  Social Needs  . Financial resource strain: Not on file  . Food insecurity:    Worry: Not on file    Inability: Not on file  . Transportation needs:    Medical: Not on file    Non-medical: Not on file  Tobacco Use  . Smoking status: Never Smoker  . Smokeless tobacco: Never Used  Substance and Sexual Activity  . Alcohol use: Yes    Comment: once monthly  . Drug use: No  .  Sexual activity: Not on file  Lifestyle  . Physical activity:    Days per week: Not on file    Minutes per session: Not on file  . Stress: Not on file  Relationships  . Social connections:    Talks on phone: Not on file    Gets together: Not on file    Attends religious service: Not on file    Active member of club or organization: Not on file    Attends meetings of clubs or organizations: Not on file    Relationship status: Not on file  Other Topics Concern  . Not  on file  Social History Narrative  . Not on file    Past Medical History:  Diagnosis Date  . Cancer [facility])    melanoma     Patient Active Problem List   Diagnosis Date Noted  . Swelling of limb 10/07/2016  . Varicose veins of bilateral lower extremities with pain 09/04/2016  . History of malignant melanoma 09/04/2016  . IBS (irritable bowel syndrome) 09/04/2015  . Hypokalemia 03/05/2015  . Post-menopausal 11/22/2014  . Allergic rhinitis 08/17/2014  . Airway hyperreactivity 08/17/2014  . Family history of cardiomyopathy 08/17/2014  . BP (high blood pressure) 08/17/2014  . Decreased potassium in the blood 08/17/2014  . Adult hypothyroidism 08/17/2014  . Adaptive colitis 08/17/2014  . 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 Adiposity 08/17/2014  . Arthritis, degenerative 08/17/2014  . Cardiac murmur 07/18/2014  . Hyperlipemia, mixed 07/17/2014  . Benign essential hypertension 07/17/2014    Past Surgical History:  Procedure Laterality Date  . ABDOMINAL HYSTERECTOMY  2007   due to fibroids. patient reports she also had ovaries removed.   Marland Kitchen BACK SURGERY  1989, 2000  . BLADDER SURGERY  2001   Tacked  . MELANOMA EXCISION  02/2004   removed from face    Family History        Family Status  Relation Name Status  . Mother  Deceased  . Father  Deceased  . Sister  Alive  . Neg Hx  (Not Specified)        Her family history includes Alzheimer's disease in her mother; Cancer in her mother; Colon cancer in her mother; Non-Hodgkin's lymphoma in her father; Squamous cell carcinoma in her mother; Thyroid disease in her sister. There is no history of Breast cancer.      Allergies  Allergen Reactions  . Gentamicin Sulfate      Current Outpatient Medications:  .  ADVAIR DISKUS 250-50 MCG/DOSE AEPB, Inhale 1 puff into the lungs 2 (two) times daily., Disp: 180 each, Rfl: 3 .  albuterol (PROVENTIL HFA;VENTOLIN HFA) 108 (90 Base) MCG/ACT inhaler, Inhale 1-2 puffs into  the lungs every 4 (four) hours as needed for wheezing or shortness of breath., Disp: 3 Inhaler, Rfl: 3 .  cetirizine (ZYRTEC) 10 MG tablet, Take by mouth., Disp: , Rfl:  .  FLUTICASONE PROPIONATE, NASAL, NA, Place into the nose., Disp: , Rfl:  .  hyoscyamine (LEVBID) 0.375 MG 12 hr tablet, Take 1 tablet (0.375 mg total) by mouth 2 (two) times daily., Disp: 60 tablet, Rfl: 0 .  montelukast (SINGULAIR) 10 MG tablet, Take 1 tablet (10 mg total) by mouth at bedtime., Disp: 90 tablet, Rfl: 1 .  potassium chloride SA (K-DUR,KLOR-CON) 20 MEQ tablet, TAKE ONE TABLET BY MOUTH TWICE DAILY, Disp: 180 tablet, Rfl: 4 .  SYNTHROID 125 MCG tablet, TAKE ONE TABLET EVERY DAY, Disp: 90 tablet, Rfl: 4 .  triamterene-hydrochlorothiazide (  MAXZIDE-25) 37.5-25 MG tablet, TAKE ONE TABLET BY MOUTH EVERY DAY, Disp: 90 tablet, Rfl: 4 .  azithromycin (ZITHROMAX) 250 MG tablet, Take 2 tablets by mouth the first day then one daily for 4 days. (Patient not taking: Reported on 11/03/2017), Disp: 6 tablet, Rfl: 0   Patient Care Team: Birdie Sons, MD as PCP - General (Family Medicine) Ralene Bathe, MD (Dermatology)      Objective:   Vitals: BP 125/76 (BP Location: Right Arm, Patient Position: Sitting, Cuff Size: Large)   Pulse 98   Temp 98.2 F (36.8 C) (Oral)   Resp 16   Ht 5\' 6"  (1.676 m)   Wt 218 lb (98.9 kg)   SpO2 96%   BMI 35.19 kg/m    Vitals:   11/03/17 1404  BP: 125/76  Pulse: 98  Resp: 16  Temp: 98.2 F (36.8 C)  TempSrc: Oral  SpO2: 96%  Weight: 218 lb (98.9 kg)  Height: 5\' 6"  (1.676 m)     Physical Exam   General Appearance:    Alert, cooperative, no distress, appears stated age, obese  Head:    Normocephalic, without obvious abnormality, atraumatic  Eyes:    PERRL, conjunctiva/corneas clear, EOM's intact, fundi    benign, both eyes  Ears:    Normal TM's and external ear canals, both ears  Nose:   Nares normal, septum midline, mucosa normal, no drainage    or sinus tenderness    Throat:   Lips, mucosa, and tongue normal; teeth and gums normal  Neck:   Supple, symmetrical, trachea midline, no adenopathy;    thyroid:  no enlargement/tenderness/nodules; no carotid   bruit or JVD  Back:     Symmetric, no curvature, ROM normal, no CVA tenderness  Lungs:     Clear to auscultation bilaterally, respirations unlabored  Chest Wall:    No tenderness or deformity   Heart:    Regular rate and rhythm, S1 and S2 normal, no murmur, rub   or gallop  Breast Exam:    normal appearance, no masses or tenderness  Abdomen:     Soft, non-tender, bowel sounds active all four quadrants,    no masses, no organomegaly  Pelvic:    not indicated; status post hysterectomy, negative ROS  Extremities:   Extremities normal, atraumatic, no cyanosis or edema  Pulses:   2+ and symmetric all extremities  Skin:   Skin color, texture, turgor normal, no rashes or lesions  Lymph nodes:   Cervical, supraclavicular, and axillary nodes normal  Neurologic:   CNII-XII intact, normal strength, sensation and reflexes    throughout    Depression Screen PHQ 2/9 Scores 11/03/2017 09/04/2016 09/04/2015  PHQ - 2 Score 0 0 0  PHQ- 9 Score 1 3 -      Assessment & Plan:     Routine Health Maintenance and Physical Exam  Exercise Activities and Dietary recommendations Goals   None     Immunization History  Administered Date(s) Administered  . Influenza-Unspecified 01/02/2017  . Tdap 04/15/2010    Health Maintenance  Topic Date Due  . COLONOSCOPY  07/28/2017  . INFLUENZA VACCINE  10/15/2017  . MAMMOGRAM  10/28/2019  . TETANUS/TDAP  04/15/2020  . Hepatitis C Screening  Completed  . HIV Screening  Completed     Discussed health benefits of physical activity, and encouraged her to engage in regular exercise appropriate for her age and condition.    --------------------------------------------------------------------  1. IBS (irritable bowel syndrome) Stable, refill- hyoscyamine (  LEVBID) 0.375  MG 12 hr tablet; Take 1 tablet (0.375 mg total) by mouth 2 (two) times daily.  Dispense: 60 tablet; Refill: 3  2. Family history of colon cancer  - Ambulatory referral to Gastroenterology  3. Annual physical exam Generally doing, counseled to exercise and reduce calories for weight loss.  - Comprehensive metabolic panel - Lipid panel - TSH  4. Essential hypertension Well controlled.  Continue current medications.   - Comprehensive metabolic panel - Lipid panel - TSH  5. Need for shingles vaccine  - Varicella-zoster vaccine IM (Shingrix)   Lelon Huh, MD  [facility]

## 2017-11-03 ENCOUNTER — Encounter: Payer: Self-pay | Admitting: Family Medicine

## 2017-11-03 ENCOUNTER — Ambulatory Visit (INDEPENDENT_AMBULATORY_CARE_PROVIDER_SITE_OTHER): Payer: BC Managed Care – PPO | Admitting: Family Medicine

## 2017-11-03 VITALS — BP 125/76 | HR 98 | Temp 98.2°F | Resp 16 | Ht 66.0 in | Wt 218.0 lb

## 2017-11-03 DIAGNOSIS — Z800 Family history of malignant neoplasm of digestive organs: Secondary | ICD-10-CM

## 2017-11-03 DIAGNOSIS — E66812 Obesity, class 2: Secondary | ICD-10-CM

## 2017-11-03 DIAGNOSIS — Z0000 Encounter for general adult medical examination without abnormal findings: Secondary | ICD-10-CM | POA: Diagnosis not present

## 2017-11-03 DIAGNOSIS — Z23 Encounter for immunization: Secondary | ICD-10-CM

## 2017-11-03 DIAGNOSIS — K589 Irritable bowel syndrome without diarrhea: Secondary | ICD-10-CM

## 2017-11-03 DIAGNOSIS — I10 Essential (primary) hypertension: Secondary | ICD-10-CM | POA: Diagnosis not present

## 2017-11-03 DIAGNOSIS — Z6835 Body mass index (BMI) 35.0-35.9, adult: Secondary | ICD-10-CM

## 2017-11-03 DIAGNOSIS — E669 Obesity, unspecified: Secondary | ICD-10-CM

## 2017-11-03 MED ORDER — HYOSCYAMINE SULFATE ER 0.375 MG PO TB12: 0.3750 mg | ORAL_TABLET | Freq: Two times a day (BID) | ORAL | 3 refills | Status: DC

## 2017-11-04 ENCOUNTER — Other Ambulatory Visit: Payer: Self-pay

## 2017-11-04 DIAGNOSIS — Z800 Family history of malignant neoplasm of digestive organs: Secondary | ICD-10-CM

## 2017-11-07 LAB — LIPID PANEL
CHOLESTEROL TOTAL: 196 mg/dL (ref 100–199)
Chol/HDL Ratio: 4.4 ratio (ref 0.0–4.4)
HDL: 45 mg/dL (ref >39)
LDL Calculated: 120 mg/dL — ABNORMAL HIGH (ref 0–99)
TRIGLYCERIDES: 154 mg/dL — AB (ref 0–149)
VLDL CHOLESTEROL CAL: 31 mg/dL (ref 5–40)

## 2017-11-07 LAB — COMPREHENSIVE METABOLIC PANEL
ALK PHOS: 77 IU/L (ref 39–117)
ALT: 14 IU/L (ref 0–32)
AST: 13 IU/L (ref 0–40)
Albumin/Globulin Ratio: 1.6 (ref 1.2–2.2)
Albumin: 4.2 g/dL (ref 3.6–4.8)
BILIRUBIN TOTAL: 0.4 mg/dL (ref 0.0–1.2)
BUN/Creatinine Ratio: 14 (ref 12–28)
BUN: 13 mg/dL (ref 8–27)
CHLORIDE: 102 mmol/L (ref 96–106)
CO2: 22 mmol/L (ref 20–29)
Calcium: 9.4 mg/dL (ref 8.7–10.3)
Creatinine, Ser: 0.92 mg/dL (ref 0.57–1.00)
GFR calc Af Amer: 78 mL/min/{1.73_m2} (ref >59)
GFR calc non Af Amer: 67 mL/min/{1.73_m2} (ref >59)
GLUCOSE: 101 mg/dL — AB (ref 65–99)
Globulin, Total: 2.7 g/dL (ref 1.5–4.5)
Potassium: 3.8 mmol/L (ref 3.5–5.2)
Sodium: 142 mmol/L (ref 134–144)
Total Protein: 6.9 g/dL (ref 6.0–8.5)

## 2017-11-07 LAB — TSH: TSH: 0.419 u[IU]/mL — ABNORMAL LOW (ref 0.450–4.500)

## 2017-11-09 ENCOUNTER — Telehealth: Payer: Self-pay

## 2017-11-09 NOTE — Telephone Encounter (Signed)
-----   Message from Birdie Sons, MD sent at 11/08/2017  9:10 PM EDT ----- Blood sugar, kidney functions, electrolytes and cholesterol are all normal. Continue current medications.  Check labs yearly.

## 2017-11-09 NOTE — Telephone Encounter (Signed)
Pt advised.   Thanks,   -Amariyah Bazar  

## 2017-11-27 ENCOUNTER — Encounter
Admission: RE | Disposition: A | Discharge status: Home / Self Care | Payer: Self-pay | Source: Ambulatory Visit | Attending: Gastroenterology

## 2017-11-27 ENCOUNTER — Ambulatory Visit
Admission: RE | Admit: 2017-11-27 | Discharge: 2017-11-27 | Disposition: A | Discharge status: Home / Self Care | Payer: BC Managed Care – PPO | Source: Ambulatory Visit | Attending: Gastroenterology | Admitting: Gastroenterology

## 2017-11-27 ENCOUNTER — Encounter: Payer: Self-pay | Admitting: *Deleted

## 2017-11-27 ENCOUNTER — Ambulatory Visit: Payer: BC Managed Care – PPO | Admitting: Anesthesiology

## 2017-11-27 DIAGNOSIS — Z86010 Personal history of colonic polyps: Secondary | ICD-10-CM | POA: Diagnosis not present

## 2017-11-27 DIAGNOSIS — D120 Benign neoplasm of cecum: Secondary | ICD-10-CM | POA: Diagnosis not present

## 2017-11-27 DIAGNOSIS — D125 Benign neoplasm of sigmoid colon: Secondary | ICD-10-CM | POA: Diagnosis not present

## 2017-11-27 DIAGNOSIS — Z79899 Other long term (current) drug therapy: Secondary | ICD-10-CM | POA: Diagnosis not present

## 2017-11-27 DIAGNOSIS — Z85820 Personal history of malignant melanoma of skin: Secondary | ICD-10-CM | POA: Insufficient documentation

## 2017-11-27 DIAGNOSIS — Z1211 Encounter for screening for malignant neoplasm of colon: Secondary | ICD-10-CM | POA: Insufficient documentation

## 2017-11-27 DIAGNOSIS — K640 First degree hemorrhoids: Secondary | ICD-10-CM | POA: Insufficient documentation

## 2017-11-27 DIAGNOSIS — J45909 Unspecified asthma, uncomplicated: Secondary | ICD-10-CM | POA: Diagnosis not present

## 2017-11-27 DIAGNOSIS — Z888 Allergy status to other drugs, medicaments and biological substances status: Secondary | ICD-10-CM | POA: Diagnosis not present

## 2017-11-27 DIAGNOSIS — K5730 Diverticulosis of large intestine without perforation or abscess without bleeding: Secondary | ICD-10-CM | POA: Insufficient documentation

## 2017-11-27 DIAGNOSIS — E039 Hypothyroidism, unspecified: Secondary | ICD-10-CM | POA: Diagnosis not present

## 2017-11-27 DIAGNOSIS — Z800 Family history of malignant neoplasm of digestive organs: Secondary | ICD-10-CM

## 2017-11-27 DIAGNOSIS — I10 Essential (primary) hypertension: Secondary | ICD-10-CM | POA: Insufficient documentation

## 2017-11-27 HISTORY — DX: Unspecified asthma, uncomplicated: J45.909

## 2017-11-27 HISTORY — PX: COLONOSCOPY WITH PROPOFOL: SHX5780

## 2017-11-27 HISTORY — DX: Essential (primary) hypertension: I10

## 2017-11-27 HISTORY — DX: Hypothyroidism, unspecified: E03.9

## 2017-11-27 SURGERY — COLONOSCOPY WITH PROPOFOL
Anesthesia: General

## 2017-11-27 MED ORDER — SODIUM CHLORIDE 0.9 % IV SOLN
INTRAVENOUS | Status: DC
  Administered 2017-11-27: 08:00:00 via INTRAVENOUS

## 2017-11-27 MED ORDER — SODIUM CHLORIDE 0.9 % IJ SOLN
INTRAMUSCULAR | Status: DC | PRN
  Administered 2017-11-27: 14 mL via INTRAVENOUS

## 2017-11-27 MED ORDER — PROPOFOL 500 MG/50ML IV EMUL
INTRAVENOUS | Status: DC | PRN
  Administered 2017-11-27: 200 ug/kg/min via INTRAVENOUS

## 2017-11-27 MED ORDER — PROPOFOL 10 MG/ML IV BOLUS
INTRAVENOUS | Status: DC | PRN
  Administered 2017-11-27: 70 mg via INTRAVENOUS
  Administered 2017-11-27: 30 mg via INTRAVENOUS

## 2017-11-27 MED ORDER — LIDOCAINE 2% (20 MG/ML) 5 ML SYRINGE
INTRAMUSCULAR | Status: DC | PRN
  Administered 2017-11-27: 50 mg via INTRAVENOUS

## 2017-11-27 MED ORDER — PROPOFOL 10 MG/ML IV BOLUS
INTRAVENOUS | Status: AC
  Filled 2017-11-27: qty 40

## 2017-11-27 MED ORDER — PROPOFOL 500 MG/50ML IV EMUL
INTRAVENOUS | Status: AC
  Filled 2017-11-27: qty 50

## 2017-11-27 NOTE — Anesthesia Postprocedure Evaluation (Signed)
Anesthesia Post Note  Patient: Angela Villarreal  Procedure(s) Performed: COLONOSCOPY WITH PROPOFOL (N/A )  Patient location during evaluation: Endoscopy Anesthesia Type: General Level of consciousness: awake and alert Pain management: pain level controlled Vital Signs Assessment: post-procedure vital signs reviewed and stable Respiratory status: spontaneous breathing and respiratory function stable Cardiovascular status: stable Anesthetic complications: no     Last Vitals:  Vitals:   11/27/17 0914 11/27/17 0924  BP: 116/72 134/81  Pulse: 90 78  Resp: 17 12  Temp: (!) 36 C   SpO2: 100% 100%    Last Pain:  Vitals:   11/27/17 0749  TempSrc: Tympanic                 Salvador Bigbee K

## 2017-11-27 NOTE — Anesthesia Post-op Follow-up Note (Signed)
Anesthesia QCDR form completed.        

## 2017-11-27 NOTE — H&P (Signed)
Jonathon Bellows, MD [address] [place], [place], [place], [address] [address], [place], [place], [place], [address] Phone: [phone]  Fax: [phone]  Primary Care Physician:  Birdie Sons, MD   Pre-Procedure History & Physical: HPI:  Angela Villarreal is a 62 y.o. female is here for an colonoscopy.   Past Medical History:  Diagnosis Date  . Asthma   . 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 Hypertension   . Hypothyroidism     Past Surgical History:  Procedure Laterality Date  . ABDOMINAL HYSTERECTOMY  2007   due to fibroids. patient reports she also had ovaries removed.   Marland Kitchen BACK SURGERY  1989, 2000  . BLADDER SURGERY  2001   Tacked  . MELANOMA EXCISION  02/2004   removed from face    Prior to Admission medications   Medication Sig Start Date End Date Taking? Authorizing Provider  ADVAIR DISKUS 250-50 MCG/DOSE AEPB Inhale 1 puff into the lungs 2 (two) times daily. 09/07/15  Yes Margarita Rana, MD  albuterol (PROVENTIL HFA;VENTOLIN HFA) 108 (90 Base) MCG/ACT inhaler Inhale 1-2 puffs into the lungs every 4 (four) hours as needed for wheezing or shortness of breath. 09/07/15  Yes Margarita Rana, MD  montelukast (SINGULAIR) 10 MG tablet Take 1 tablet (10 mg total) by mouth at bedtime. 09/07/15  Yes Margarita Rana, MD  SYNTHROID 125 MCG tablet TAKE ONE TABLET EVERY DAY 01/13/17  Yes Birdie Sons, MD  triamterene-hydrochlorothiazide [facility]) 37.5-25 MG tablet TAKE ONE TABLET BY MOUTH EVERY DAY 10/04/16  Yes Birdie Sons, MD  cetirizine (ZYRTEC) 10 MG tablet Take by mouth.    [provider]  FLUTICASONE PROPIONATE, NASAL, NA Place into the nose.    [provider]  hyoscyamine (LEVBID) 0.375 MG 12 hr tablet Take 1 tablet (0.375 mg total) by mouth 2 (two) times daily. 11/03/17   Birdie Sons, MD  potassium chloride SA (K-DUR,KLOR-CON) 20 MEQ tablet TAKE ONE TABLET BY MOUTH TWICE DAILY 10/04/16   Birdie Sons, MD    Allergies  as of 11/05/2017 - Review Complete 11/03/2017  Allergen Reaction Noted  . Gentamicin sulfate  08/17/2014    Family History  Problem Relation Age of Onset  . Colon cancer Mother   . Squamous cell carcinoma Mother   . Alzheimer's disease Mother   . Cancer Mother        colon  . Non-Hodgkin's lymphoma Father   . Thyroid disease Sister   . Breast cancer Neg Hx     Social History   Socioeconomic History  . Marital status: Divorced    Spouse name: Not on file  . Number of children: Not on file  . Years of education: Not on file  . Highest education level: Not on file  Occupational History  . Not on file  Social Needs  . Financial resource strain: Not on file  . Food insecurity:    Worry: Not on file    Inability: Not on file  . Transportation needs:    Medical: Not on file    Non-medical: Not on file  Tobacco Use  . Smoking status: Never Smoker  . Smokeless tobacco: Never Used  Substance and Sexual Activity  . Alcohol use: Yes    Comment: once monthly  . Drug use: No  . Sexual activity: Not on file  Lifestyle  . Physical activity:    Days per week: Not on file    Minutes per session:  Not on file  . Stress: Not on file  Relationships  . Social connections:    Talks on phone: Not on file    Gets together: Not on file    Attends religious service: Not on file    Active member of club or organization: Not on file    Attends meetings of clubs or organizations: Not on file    Relationship status: Not on file  . Intimate partner violence:    Fear of current or ex partner: Not on file    Emotionally abused: Not on file    Physically abused: Not on file    Forced sexual activity: Not on file  Other Topics Concern  . Not on file  Social History Narrative  . Not on file    Review of Systems: See HPI, otherwise negative ROS  Physical Exam: BP (!) 158/92   Pulse 88   Temp (!) 96.7 F (35.9 C) (Tympanic)   Ht 5\' 6"  (1.676 m)   Wt 98.9 kg   SpO2 98%   BMI  35.19 kg/m  General:   Alert,  pleasant and cooperative in NAD Head:  Normocephalic and atraumatic. Neck:  Supple; no masses or thyromegaly. Lungs:  Clear throughout to auscultation, normal respiratory effort.    Heart:  +S1, +S2, Regular rate and rhythm, No edema. Abdomen:  Soft, nontender and nondistended. Normal bowel sounds, without guarding, and without rebound.   Neurologic:  Alert and  oriented x4;  grossly normal neurologically.  Impression/Plan: Angela Villarreal is here for an colonoscopy to be performed for surveillance due to prior history of colon polyps   Risks, benefits, limitations, and alternatives regarding  colonoscopy have been reviewed with the patient.  Questions have been answered.  All parties agreeable.   Jonathon Bellows, MD  11/27/2017, 8:33 AM

## 2017-11-27 NOTE — Transfer of Care (Signed)
Immediate Anesthesia Transfer of Care Note  Patient: Angela Villarreal  Procedure(s) Performed: COLONOSCOPY WITH PROPOFOL (N/A )  Patient Location: PACU  Anesthesia Type:General  Level of Consciousness: awake  Airway & Oxygen Therapy: Patient connected to nasal cannula oxygen  Post-op Assessment: Post -op Vital signs reviewed and stable  Post vital signs: stable  Last Vitals:  Vitals Value Taken Time  BP    Temp    Pulse 90 11/27/2017  9:13 AM  Resp 17 11/27/2017  9:13 AM  SpO2 100 % 11/27/2017  9:13 AM  Vitals shown include unvalidated device data.  Last Pain:  Vitals:   11/27/17 0749  TempSrc: Tympanic         Complications: No apparent anesthesia complications

## 2017-11-27 NOTE — Anesthesia Preprocedure Evaluation (Signed)
Anesthesia Evaluation  Patient identified by MRN, date of birth, ID band Patient awake    Reviewed: Allergy & Precautions, NPO status , Patient's Chart, lab work & pertinent test results  History of Anesthesia Complications Negative for: history of anesthetic complications  Airway Mallampati: I       Dental   Pulmonary asthma , neg sleep apnea, neg COPD,           Cardiovascular hypertension, Pt. on medications (-) Past MI and (-) CHF (-) dysrhythmias + Valvular Problems/Murmurs (murmur, no tx)      Neuro/Psych neg Seizures    GI/Hepatic Neg liver ROS, neg GERD  ,  Endo/Other  neg diabetesHypothyroidism   Renal/GU negative Renal ROS     Musculoskeletal   Abdominal   Peds  Hematology   Anesthesia Other Findings   Reproductive/Obstetrics                             Anesthesia Physical Anesthesia Plan  ASA: II  Anesthesia Plan: General   Post-op Pain Management:    Induction: Intravenous  PONV Risk Score and Plan: 3 and TIVA, Propofol infusion, Midazolam and Treatment may vary due to age or medical condition  Airway Management Planned: Nasal Cannula  Additional Equipment:   Intra-op Plan:   Post-operative Plan:   Informed Consent: I have reviewed the patients History and Physical, chart, labs and discussed the procedure including the risks, benefits and alternatives for the proposed anesthesia with the patient or authorized representative who has indicated his/her understanding and acceptance.     Plan Discussed with:   Anesthesia Plan Comments:         Anesthesia Quick Evaluation

## 2017-11-27 NOTE — Op Note (Signed)
[facility] Gastroenterology Patient Name: Angela Villarreal Procedure Date: 11/27/2017 8:41 AM MRN: [id number] Account #: [id number] Date of Birth: [birth year] Admit Type: Outpatient Age: 62 Room: [facility] ENDO ROOM 4 Gender: Female Note Status: Finalized Procedure:            Colonoscopy Indications:          High risk colon cancer surveillance: Personal history                        of colonic polyps Providers:            Jonathon Bellows MD, MD Referring MD:         Kirstie Peri. Caryn Section, MD (Referring MD) Medicines:            Monitored Anesthesia Care Complications:        No immediate complications. Procedure:            Pre-Anesthesia Assessment:                       - Prior to the procedure, a History and Physical was                        performed, and patient medications, allergies and                        sensitivities were reviewed. The patient's tolerance of                        previous anesthesia was reviewed.                       - The risks and benefits of the procedure and the                        sedation options and risks were discussed with the                        patient. All questions were answered and informed                        consent was obtained.                       - ASA Grade Assessment: II - A patient with mild                        systemic disease.                       After obtaining informed consent, the colonoscope was                        passed under direct vision. Throughout the procedure,                        the patient's blood pressure, pulse, and oxygen                        saturations were monitored continuously. The  Colonoscope was introduced through the anus and                        advanced to the the cecum, identified by the                        appendiceal orifice, IC valve and transillumination.                        The colonoscopy was performed without difficulty. The                    patient tolerated the procedure well. The quality of                        the bowel preparation was good. Findings:      The perianal and digital rectal examinations were normal.      Internal hemorrhoids were found during retroflexion. The hemorrhoids       were Grade I (internal hemorrhoids that do not prolapse).      Multiple small-mouthed diverticula were found in the sigmoid colon.      A 10 mm polyp was found in the cecum. The polyp was sessile.       Preparations were made for mucosal resection. 14 mL of saline was       injected with adequate lift of the lesion from the muscularis propria.       Snare mucosal resection with suction (via the working channel) retrieval       was performed. A 12 mm area was resected. Resection and retrieval were       complete. There was no bleeding during, and at the end, of the procedure.      A 5 mm polyp was found in the sigmoid colon. The polyp was sessile. The       polyp was removed with a cold snare. Resection and retrieval were       complete.      The exam was otherwise without abnormality on direct and retroflexion       views. Impression:           - Internal hemorrhoids.                       - Diverticulosis in the sigmoid colon.                       - One 10 mm polyp in the cecum, removed with mucosal                        resection. Resected and retrieved.                       - One 5 mm polyp in the sigmoid colon, removed with a                        cold snare. Resected and retrieved.                       - The examination was otherwise normal on direct and                        retroflexion views.                       -  Mucosal resection was performed. Resection and                        retrieval were complete. Recommendation:       - Discharge patient to home (with escort).                       - Resume previous diet.                       - Continue present medications.                       -  Await pathology results.                       - Repeat colonoscopy in 3 years for surveillance. Procedure Code(s):    --- Professional ---                       [phone], 57, Colonoscopy, flexible; with endoscopic                        mucosal resection                       [phone], Colonoscopy, flexible; with removal of tumor(s),                        polyp(s), or other lesion(s) by snare technique Diagnosis Code(s):    --- Professional ---                       Z86.010, Personal history of colonic polyps                       K64.0, First degree hemorrhoids                       D12.0, Benign neoplasm of cecum                       K57.30, Diverticulosis of large intestine without                        perforation or abscess without bleeding                       D12.5, Benign neoplasm of sigmoid colon CPT copyright 2017 [employer]. All rights reserved. The codes documented in this report are preliminary and upon coder review may  be revised to meet current compliance requirements. Jonathon Bellows, MD Jonathon Bellows MD, MD 11/27/2017 9:10:21 AM This report has been signed electronically. Number of Addenda: 0 Note Initiated On: 11/27/2017 8:41 AM Scope Withdrawal Time: 0 hours 17 minutes 30 seconds  Total Procedure Duration: 0 hours 21 minutes 1 second       [facility], [place]

## 2017-11-30 ENCOUNTER — Encounter: Payer: Self-pay | Admitting: Gastroenterology

## 2017-11-30 LAB — SURGICAL PATHOLOGY

## 2017-12-01 ENCOUNTER — Encounter: Payer: Self-pay | Admitting: Family Medicine

## 2017-12-01 DIAGNOSIS — Z86010 Personal history of colonic polyps: Secondary | ICD-10-CM | POA: Insufficient documentation

## 2017-12-06 ENCOUNTER — Encounter: Payer: Self-pay | Admitting: Gastroenterology

## 2017-12-16 ENCOUNTER — Ambulatory Visit: Payer: BC Managed Care – PPO | Admitting: Family Medicine

## 2017-12-16 ENCOUNTER — Encounter: Payer: Self-pay | Admitting: Family Medicine

## 2017-12-16 VITALS — BP 143/91 | HR 85 | Temp 98.4°F | Resp 16 | Wt 218.0 lb

## 2017-12-16 DIAGNOSIS — M25562 Pain in left knee: Secondary | ICD-10-CM

## 2017-12-16 NOTE — Patient Instructions (Addendum)
Start doing straight leg raises, 10-20 reps twice a day. You can also wear an elastic knee brace as needed.  You can continue taking 1-2 Aleve and applying ice to knee twice a day for 7-10 more days  Call for orthopedic referral if not much better within 10 days.      Knee Sprain A knee sprain is a stretch or tear in a knee ligament. Knee ligaments are bands of tissue that connect bones in the knee to each other. Follow these instructions at home: If you have a splint or brace:  Wear the splint or brace as told by your doctor. Remove it only as told by your doctor.  Loosen the splint or brace if your toes tingle, get numb, or turn cold and blue.  Keep the splint or brace clean.  If the splint or brace is not waterproof: ? Do not let it get wet. ? Cover it with a watertight covering when you take a bath or a shower. If you have a cast:  Do not stick anything inside the cast to scratch your skin.  Check the skin around the cast every day. Tell your doctor about any concerns.  You may put lotion on dry skin around the edges of the cast. Do not put lotion on the skin underneath the cast.  Keep the cast clean.  If the cast is not waterproof: ? Do not let it get wet. ? Cover it with a watertight covering when you take a bath or a shower. Managing pain, stiffness, and swelling  Gently move your toes often to avoid stiffness and to lessen swelling.  Raise (elevate) the injured area above the level of your heart while you are sitting or lying down.  Take over-the-counter and prescription medicines only as told by your doctor.  If directed, put ice on the injured area. ? If you have a removable splint or brace, remove it as told by your doctor. ? Put ice in a plastic bag. ? Place a towel between your skin and the bag or between your cast and the bag. ? Leave the ice on for 20 minutes, 2-3 times a day. General instructions  Do exercises as told by your doctor.  Keep all  follow-up visits as told by your doctor. This is important. Contact a doctor if:  You have pain that gets worse.  The cast, brace, or splint does not fit right.  The cast, brace, or splint gets damaged. Get help right away if:  You cannot lean on your knee to stand or walk.  You cannot move the injured area.  You knee buckles or you have pain after you walk only a few steps.  You have very bad pain, swelling, or numbness below the cast, brace, or splint. Summary  A knee sprain is a stretch or tear in a band (ligament) that connects your knee bones to each other.  You may need to wear a splint, brace, or cast to help your knee get better.  Contact your doctor if you have very bad pain, swelling, or numbness, or if you cannot walk. This information is not intended to replace advice given to you by your health care provider. Make sure you discuss any questions you have with your health care provider. Document Released: 02/19/2009 Document Revised: 11/20/2015 Document Reviewed: 11/20/2015 Elsevier Interactive Patient Education  2017 [employer].

## 2017-12-16 NOTE — Progress Notes (Signed)
Patient: Angela Villarreal Female    DOB: 1955/03/20   62 y.o.   MRN: [id number] Visit Date: 12/16/2017  Today's Provider: Lelon Huh, MD   Chief Complaint  Patient presents with  . Knee Pain   Subjective:    Knee Pain   The incident occurred more than 1 week ago (several months). There was no injury mechanism. The pain is present in the left knee. The quality of the pain is described as aching and stabbing. The pain is at a severity of 5/10. The pain is moderate. The pain has been fluctuating since onset. Associated symptoms include an inability to bear weight, a loss of motion, muscle weakness and tingling. Pertinent negatives include no loss of sensation or numbness. She reports no foreign bodies present. The symptoms are aggravated by movement and weight bearing. She has tried ice, NSAIDs and rest for the symptoms. The treatment provided moderate relief.    Patient has had left knee pain for several months. Patient states knee pain has gradually worsened. Patient states 6 days ago her knee swelled up and was very painful. Patient states she treated swelling and pain with Aleve and ice, till she saw some improvement. Patient states she is having pain when bearing weight on knee and also having pain when twisting. Also pain is mostly in the back of her knee.  Allergies  Allergen Reactions  . Gentamicin Sulfate      Current Outpatient Medications:  .  ADVAIR DISKUS 250-50 MCG/DOSE AEPB, Inhale 1 puff into the lungs 2 (two) times daily., Disp: 180 each, Rfl: 3 .  albuterol (PROVENTIL HFA;VENTOLIN HFA) 108 (90 Base) MCG/ACT inhaler, Inhale 1-2 puffs into the lungs every 4 (four) hours as needed for wheezing or shortness of breath., Disp: 3 Inhaler, Rfl: 3 .  cetirizine (ZYRTEC) 10 MG tablet, Take by mouth., Disp: , Rfl:  .  FLUTICASONE PROPIONATE, NASAL, NA, Place into the nose., Disp: , Rfl:  .  hyoscyamine (LEVBID) 0.375 MG 12 hr tablet, Take 1 tablet (0.375 mg total) by mouth  2 (two) times daily., Disp: 60 tablet, Rfl: 3 .  montelukast (SINGULAIR) 10 MG tablet, Take 1 tablet (10 mg total) by mouth at bedtime., Disp: 90 tablet, Rfl: 1 .  potassium chloride SA (K-DUR,KLOR-CON) 20 MEQ tablet, TAKE ONE TABLET BY MOUTH TWICE DAILY, Disp: 180 tablet, Rfl: 4 .  SYNTHROID 125 MCG tablet, TAKE ONE TABLET EVERY DAY, Disp: 90 tablet, Rfl: 4 .  triamterene-hydrochlorothiazide (MAXZIDE-25) 37.5-25 MG tablet, TAKE ONE TABLET BY MOUTH EVERY DAY, Disp: 90 tablet, Rfl: 4  Review of Systems  Constitutional: Negative for appetite change, chills, fatigue and fever.  Respiratory: Negative for chest tightness and shortness of breath.   Cardiovascular: Negative for chest pain and palpitations.  Gastrointestinal: Negative for abdominal pain, nausea and vomiting.  Musculoskeletal: Positive for arthralgias.  Neurological: Positive for tingling. Negative for dizziness, weakness and numbness.    Social History   Tobacco Use  . Smoking status: Never Smoker  . Smokeless tobacco: Never Used  Substance Use Topics  . Alcohol use: Yes    Comment: once monthly   Objective:   BP (!) 156/90 (BP Location: Right Arm, Patient Position: Sitting, Cuff Size: Large)   Pulse 85   Temp 98.4 F (36.9 C) (Oral)   Resp 16   Wt 218 lb (98.9 kg)   SpO2 95%   BMI 35.19 kg/m  Vitals:   12/16/17 0855  BP: (!) 156/90  Pulse:  85  Resp: 16  Temp: 98.4 F (36.9 C)  TempSrc: Oral  SpO2: 95%  Weight: 218 lb (98.9 kg)     Physical Exam  General appearance: alert, well developed, well nourished, cooperative and in no distress Head: Normocephalic, without obvious abnormality, atraumatic Respiratory: Respirations even and unlabored, normal respiratory rate Extremities: Mildly swollen left knee. Tender along lateral and medial joint lines. Negative mcmurray's sign. Stable joint.      Assessment & Plan:     1. Acute pain of left knee Likely some underlying OA and worsening sx over the last few  weeks.. Recommend straight leg raises and wearing elastic knee wrap when ambulating. Can continue OTC ibuprofen or Aleve per label for 1-2 weeks. Call for orthopedic referral if not much better in 1-2 weeks.        Lelon Huh, MD  [facility]

## 2017-12-30 ENCOUNTER — Other Ambulatory Visit: Payer: Self-pay | Admitting: Family Medicine

## 2017-12-30 DIAGNOSIS — I10 Essential (primary) hypertension: Secondary | ICD-10-CM

## 2018-01-02 ENCOUNTER — Other Ambulatory Visit: Payer: Self-pay | Admitting: Family Medicine

## 2018-01-02 DIAGNOSIS — E876 Hypokalemia: Secondary | ICD-10-CM

## 2018-01-05 ENCOUNTER — Ambulatory Visit (INDEPENDENT_AMBULATORY_CARE_PROVIDER_SITE_OTHER): Payer: BC Managed Care – PPO | Admitting: Family Medicine

## 2018-01-05 DIAGNOSIS — Z23 Encounter for immunization: Secondary | ICD-10-CM | POA: Diagnosis not present

## 2018-01-05 NOTE — Progress Notes (Signed)
Nurse Visit. Patient here today for 2nd shingles Vaccine. First dose given 11/03/2017.

## 2018-02-20 ENCOUNTER — Other Ambulatory Visit: Payer: Self-pay | Admitting: Family Medicine

## 2018-02-20 DIAGNOSIS — E039 Hypothyroidism, unspecified: Secondary | ICD-10-CM

## 2018-02-26 ENCOUNTER — Other Ambulatory Visit: Payer: Self-pay | Admitting: Unknown Physician Specialty

## 2018-02-26 DIAGNOSIS — M25562 Pain in left knee: Secondary | ICD-10-CM

## 2018-03-02 ENCOUNTER — Encounter: Payer: Self-pay | Admitting: Family Medicine

## 2018-03-02 ENCOUNTER — Ambulatory Visit: Payer: BC Managed Care – PPO | Admitting: Family Medicine

## 2018-03-02 VITALS — BP 144/82 | HR 88 | Temp 98.4°F | Resp 16 | Wt 213.0 lb

## 2018-03-02 DIAGNOSIS — S161XXA Strain of muscle, fascia and tendon at neck level, initial encounter: Secondary | ICD-10-CM | POA: Diagnosis not present

## 2018-03-02 DIAGNOSIS — S46912A Strain of unspecified muscle, fascia and tendon at shoulder and upper arm level, left arm, initial encounter: Secondary | ICD-10-CM | POA: Diagnosis not present

## 2018-03-02 DIAGNOSIS — I10 Essential (primary) hypertension: Secondary | ICD-10-CM

## 2018-03-02 DIAGNOSIS — S39012A Strain of muscle, fascia and tendon of lower back, initial encounter: Secondary | ICD-10-CM | POA: Diagnosis not present

## 2018-03-02 DIAGNOSIS — S29012A Strain of muscle and tendon of back wall of thorax, initial encounter: Secondary | ICD-10-CM | POA: Diagnosis not present

## 2018-03-02 MED ORDER — AMLODIPINE BESYLATE 2.5 MG PO TABS: 2.5000 mg | ORAL_TABLET | Freq: Every day | ORAL | 2 refills | Status: DC
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
## 2018-03-03 ENCOUNTER — Ambulatory Visit: Payer: BC Managed Care – PPO | Admitting: Family Medicine

## 2018-03-04 ENCOUNTER — Ambulatory Visit
Admission: RE | Admit: 2018-03-04 | Discharge: 2018-03-04 | Disposition: A | Discharge status: Home / Self Care | Payer: BC Managed Care – PPO | Source: Ambulatory Visit | Attending: Unknown Physician Specialty | Admitting: Unknown Physician Specialty

## 2018-03-04 DIAGNOSIS — M25562 Pain in left knee: Secondary | ICD-10-CM

## 2018-03-15 ENCOUNTER — Ambulatory Visit
Admission: RE | Admit: 2018-03-15 | Discharge: 2018-03-15 | Disposition: A | Discharge status: Home / Self Care | Payer: No Typology Code available for payment source | Attending: Family Medicine | Admitting: Family Medicine

## 2018-03-15 ENCOUNTER — Ambulatory Visit
Admission: RE | Admit: 2018-03-15 | Discharge: 2018-03-15 | Disposition: A | Discharge status: Home / Self Care | Payer: No Typology Code available for payment source | Source: Ambulatory Visit | Attending: Family Medicine | Admitting: Family Medicine

## 2018-03-15 ENCOUNTER — Encounter: Payer: Self-pay | Admitting: Family Medicine

## 2018-03-15 ENCOUNTER — Ambulatory Visit: Payer: BC Managed Care – PPO | Admitting: Family Medicine

## 2018-03-15 VITALS — BP 143/89 | HR 86 | Temp 98.8°F | Resp 16 | Wt 211.0 lb

## 2018-03-15 DIAGNOSIS — M545 Low back pain, unspecified: Secondary | ICD-10-CM

## 2018-03-15 DIAGNOSIS — J40 Bronchitis, not specified as acute or chronic: Secondary | ICD-10-CM

## 2018-03-15 MED ORDER — AZITHROMYCIN 250 MG PO TABS: ORAL_TABLET | ORAL | 0 refills | Status: AC
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
## 2018-03-23 NOTE — Patient Instructions (Signed)
.   Please bring all of your medications to every appointment so we can make sure that our medication list is the same as yours.   

## 2018-03-29 ENCOUNTER — Ambulatory Visit: Payer: BC Managed Care – PPO | Admitting: Family Medicine

## 2018-03-29 ENCOUNTER — Encounter: Payer: Self-pay | Admitting: Family Medicine

## 2018-03-29 VITALS — BP 142/88 | HR 76 | Temp 98.5°F | Resp 16 | Ht 66.0 in | Wt 212.0 lb

## 2018-03-29 DIAGNOSIS — I10 Essential (primary) hypertension: Secondary | ICD-10-CM | POA: Diagnosis not present

## 2018-03-29 DIAGNOSIS — J40 Bronchitis, not specified as acute or chronic: Secondary | ICD-10-CM | POA: Diagnosis not present

## 2018-03-29 MED ORDER — DOXYCYCLINE HYCLATE 100 MG PO TABS: 100.0000 mg | ORAL_TABLET | Freq: Two times a day (BID) | ORAL | 0 refills | Status: AC

## 2018-03-29 MED ORDER — AMLODIPINE BESYLATE 5 MG PO TABS: 5.0000 mg | ORAL_TABLET | Freq: Every day | ORAL | 2 refills | Status: DC
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
## 2018-03-29 NOTE — Patient Instructions (Signed)
.   Please bring all of your medications to every appointment so we can make sure that our medication list is the same as yours.    You can try OTC glucosamine (such as Osteo-Biflex) to help with arthritis in large joints, and OTC Tumeric to help with arthritis in small joints.

## 2018-05-27 ENCOUNTER — Telehealth: Payer: Self-pay

## 2018-05-27 NOTE — Telephone Encounter (Signed)
Patient called office stating that she was returning your call in regards to medical records? Patient can be reached on her home number when your available. KW

## 2018-06-07 ENCOUNTER — Telehealth: Payer: Self-pay | Admitting: Family Medicine

## 2018-06-07 NOTE — Telephone Encounter (Signed)
Called saying she has been having bad muscle spasms in her back lower rt hip area  She would like to have a muscle relaxer sent to the pharmacy  Cyclobenzatrine 5mg   [facility]  CB# [phone]  Thanks Con Memos

## 2018-06-08 MED ORDER — CYCLOBENZAPRINE HCL 5 MG PO TABS: 5.0000 mg | ORAL_TABLET | Freq: Three times a day (TID) | ORAL | 1 refills | Status: DC | PRN

## 2018-06-16 ENCOUNTER — Telehealth: Payer: Self-pay

## 2018-06-16 MED ORDER — METRONIDAZOLE 0.75 % VA GEL: 1.0000 | Freq: Every day | VAGINAL | 0 refills | Status: DC

## 2018-06-16 NOTE — Telephone Encounter (Signed)
Patient calling that she thinks she may be having BV since Monday. Reports that is irritated and burning. She is having clear discharge. No fever or respiratory symptoms. Offered and office visit but patient reports taht she has asthma and does not want to come in. Told her that we are offering e-visits and if she was interested on doing an e-visit she stated yes, but still wanted to check with you if you are ok with the e-visit or does she needs to come in.

## 2018-06-16 NOTE — Addendum Note (Signed)
Addended by: Ashley Royalty E on: 06/16/2018 03:31 PM   Modules accepted: Orders

## 2018-06-16 NOTE — Telephone Encounter (Signed)
Pt advised.  Please send to [facility].   Thanks,   -Mickel Baas

## 2018-06-16 NOTE — Telephone Encounter (Signed)
Can sent in prescription for metronidazole vaginal gel QHS for 7 days which covers BV. Call if not effective.

## 2018-07-01 ENCOUNTER — Other Ambulatory Visit: Payer: Self-pay | Admitting: Family Medicine

## 2018-07-01 DIAGNOSIS — I10 Essential (primary) hypertension: Secondary | ICD-10-CM

## 2018-07-01 NOTE — Telephone Encounter (Signed)
Patient due for follow up appointment for uncontrolled HTN per last office note. Appointment scheduled for 07/02/2018 at 9:20am. I approved refill for 30 day supply since patient was out of medication.

## 2018-07-02 ENCOUNTER — Other Ambulatory Visit: Payer: Self-pay

## 2018-07-02 ENCOUNTER — Encounter: Payer: Self-pay | Admitting: Family Medicine

## 2018-07-02 ENCOUNTER — Ambulatory Visit: Payer: BC Managed Care – PPO | Admitting: Family Medicine

## 2018-07-02 VITALS — BP 140/74 | HR 87 | Temp 98.7°F | Wt 213.0 lb

## 2018-07-02 DIAGNOSIS — J301 Allergic rhinitis due to pollen: Secondary | ICD-10-CM | POA: Diagnosis not present

## 2018-07-02 DIAGNOSIS — I10 Essential (primary) hypertension: Secondary | ICD-10-CM | POA: Diagnosis not present

## 2018-07-02 DIAGNOSIS — K589 Irritable bowel syndrome without diarrhea: Secondary | ICD-10-CM

## 2018-07-02 DIAGNOSIS — F439 Reaction to severe stress, unspecified: Secondary | ICD-10-CM | POA: Diagnosis not present

## 2018-07-02 DIAGNOSIS — J45909 Unspecified asthma, uncomplicated: Secondary | ICD-10-CM | POA: Diagnosis not present

## 2018-07-02 NOTE — Patient Instructions (Signed)
. Please review the attached list of medications and notify my office if there are any errors.   . Please bring all of your medications to every appointment so we can make sure that our medication list is the same as yours.    DASH Eating Plan DASH stands for "Dietary Approaches to Stop Hypertension." The DASH eating plan is a healthy eating plan that has been shown to reduce high blood pressure (hypertension). It may also reduce your risk for type 2 diabetes, heart disease, and stroke. The DASH eating plan may also help with weight loss. What are tips for following this plan?  General guidelines  Avoid eating more than 2,300 mg (milligrams) of salt (sodium) a day. If you have hypertension, you may need to reduce your sodium intake to 1,500 mg a day.  Limit alcohol intake to no more than 1 drink a day for nonpregnant women and 2 drinks a day for men. One drink equals 12 oz of beer, 5 oz of wine, or 1 oz of hard liquor.  Work with your health care provider to maintain a healthy body weight or to lose weight. Ask what an ideal weight is for you.  Get at least 30 minutes of exercise that causes your heart to beat faster (aerobic exercise) most days of the week. Activities may include walking, swimming, or biking.  Work with your health care provider or diet and nutrition specialist (dietitian) to adjust your eating plan to your individual calorie needs. Reading food labels   Check food labels for the amount of sodium per serving. Choose foods with less than 5 percent of the Daily Value of sodium. Generally, foods with less than 300 mg of sodium per serving fit into this eating plan.  To find whole grains, look for the word "whole" as the first word in the ingredient list. Shopping  Buy products labeled as "low-sodium" or "no salt added."  Buy fresh foods. Avoid canned foods and premade or frozen meals. Cooking  Avoid adding salt when cooking. Use salt-free seasonings or herbs instead  of table salt or sea salt. Check with your health care provider or pharmacist before using salt substitutes.  Do not fry foods. Cook foods using healthy methods such as baking, boiling, grilling, and broiling instead.  Cook with heart-healthy oils, such as olive, canola, soybean, or sunflower oil. Meal planning  Eat a balanced diet that includes: ? 5 or more servings of fruits and vegetables each day. At each meal, try to fill half of your plate with fruits and vegetables. ? Up to 6-8 servings of whole grains each day. ? Less than 6 oz of lean meat, poultry, or fish each day. A 3-oz serving of meat is about the same size as a deck of cards. One egg equals 1 oz. ? 2 servings of low-fat dairy each day. ? A serving of nuts, seeds, or beans 5 times each week. ? Heart-healthy fats. Healthy fats called Omega-3 fatty acids are found in foods such as flaxseeds and coldwater fish, like sardines, salmon, and mackerel.  Limit how much you eat of the following: ? Canned or prepackaged foods. ? Food that is high in trans fat, such as fried foods. ? Food that is high in saturated fat, such as fatty meat. ? Sweets, desserts, sugary drinks, and other foods with added sugar. ? Full-fat dairy products.  Do not salt foods before eating.  Try to eat at least 2 vegetarian meals each week.  Eat more home-cooked   food and less restaurant, buffet, and fast food.  When eating at a restaurant, ask that your food be prepared with less salt or no salt, if possible. What foods are recommended? The items listed may not be a complete list. Talk with your dietitian about what dietary choices are best for you. Grains Whole-grain or whole-wheat bread. Whole-grain or whole-wheat pasta. Brown rice. Oatmeal. Quinoa. Bulgur. Whole-grain and low-sodium cereals. Pita bread. Low-fat, low-sodium crackers. Whole-wheat flour tortillas. Vegetables Fresh or frozen vegetables (raw, steamed, roasted, or grilled). Low-sodium or  reduced-sodium tomato and vegetable juice. Low-sodium or reduced-sodium tomato sauce and tomato paste. Low-sodium or reduced-sodium canned vegetables. Fruits All fresh, dried, or frozen fruit. Canned fruit in natural juice (without added sugar). Meat and other protein foods Skinless chicken or turkey. Ground chicken or turkey. Pork with fat trimmed off. Fish and seafood. Egg whites. Dried beans, peas, or lentils. Unsalted nuts, nut butters, and seeds. Unsalted canned beans. Lean cuts of beef with fat trimmed off. Low-sodium, lean deli meat. Dairy Low-fat (1%) or fat-free (skim) milk. Fat-free, low-fat, or reduced-fat cheeses. Nonfat, low-sodium ricotta or cottage cheese. Low-fat or nonfat yogurt. Low-fat, low-sodium cheese. Fats and oils Soft margarine without trans fats. Vegetable oil. Low-fat, reduced-fat, or light mayonnaise and salad dressings (reduced-sodium). Canola, safflower, olive, soybean, and sunflower oils. Avocado. Seasoning and other foods Herbs. Spices. Seasoning mixes without salt. Unsalted popcorn and pretzels. Fat-free sweets. What foods are not recommended? The items listed may not be a complete list. Talk with your dietitian about what dietary choices are best for you. Grains Baked goods made with fat, such as croissants, muffins, or some breads. Dry pasta or rice meal packs. Vegetables Creamed or fried vegetables. Vegetables in a cheese sauce. Regular canned vegetables (not low-sodium or reduced-sodium). Regular canned tomato sauce and paste (not low-sodium or reduced-sodium). Regular tomato and vegetable juice (not low-sodium or reduced-sodium). Pickles. Olives. Fruits Canned fruit in a light or heavy syrup. Fried fruit. Fruit in cream or butter sauce. Meat and other protein foods Fatty cuts of meat. Ribs. Fried meat. Bacon. Sausage. Bologna and other processed lunch meats. Salami. Fatback. Hotdogs. Bratwurst. Salted nuts and seeds. Canned beans with added salt. Canned or  smoked fish. Whole eggs or egg yolks. Chicken or turkey with skin. Dairy Whole or 2% milk, cream, and half-and-half. Whole or full-fat cream cheese. Whole-fat or sweetened yogurt. Full-fat cheese. Nondairy creamers. Whipped toppings. Processed cheese and cheese spreads. Fats and oils Butter. Stick margarine. Lard. Shortening. Ghee. Bacon fat. Tropical oils, such as coconut, palm kernel, or palm oil. Seasoning and other foods Salted popcorn and pretzels. Onion salt, garlic salt, seasoned salt, table salt, and sea salt. Worcestershire sauce. Tartar sauce. Barbecue sauce. Teriyaki sauce. Soy sauce, including reduced-sodium. Steak sauce. Canned and packaged gravies. Fish sauce. Oyster sauce. Cocktail sauce. Horseradish that you find on the shelf. Ketchup. Mustard. Meat flavorings and tenderizers. Bouillon cubes. Hot sauce and Tabasco sauce. Premade or packaged marinades. Premade or packaged taco seasonings. Relishes. Regular salad dressings. Where to find more information:  National Heart, Lung, and Blood Institute: www.nhlbi.nih.gov  American Heart Association: www.heart.org Summary  The DASH eating plan is a healthy eating plan that has been shown to reduce high blood pressure (hypertension). It may also reduce your risk for type 2 diabetes, heart disease, and stroke.  With the DASH eating plan, you should limit salt (sodium) intake to 2,300 mg a day. If you have hypertension, you may need to reduce your sodium intake to 1,500 mg   mg a day.  When on the DASH eating plan, aim to eat more fresh fruits and vegetables, whole grains, lean proteins, low-fat dairy, and heart-healthy fats.  Work with your health care provider or diet and nutrition specialist (dietitian) to adjust your eating plan to your individual calorie needs. This information is not intended to replace advice given to you by your health care provider. Make sure you discuss any questions you have with your health care provider. Document  Released: 02/20/2011 Document Revised: 02/25/2016 Document Reviewed: 02/25/2016 Elsevier Interactive Patient Education  2019 [employer].

## 2018-07-02 NOTE — Progress Notes (Signed)
Patient: Angela Villarreal Female    DOB: May 16, 1955   63 y.o.   MRN: [id number] Visit Date: 07/02/2018  Today's Provider: Lelon Huh, MD   Chief Complaint  Patient presents with  . Hypertension   Subjective:     HPI    Hypertension, follow-up:  BP Readings from Last 3 Encounters:  07/02/18 (!) 151/83  03/29/18 (!) 142/88  03/15/18 (!) 143/89    She was last seen for hypertension 3 months ago.  BP at that visit was 142/88. Management since that visit includes Increased Amlodipine to 5mg . She reports excellent compliance with treatment. She is not having side effects.  She is exercising. She is not adherent to low salt diet.   Outside blood pressures are not being checked. She is experiencing lower extremity edema.  Patient denies chest pain, fatigue, palpitations and paroxysmal nocturnal dyspnea.   Cardiovascular risk factors include advanced age (older than 68 for men, 36 for women), hypertension and obesity (BMI >= 30 kg/m2).  Use of agents associated with hypertension: none.     Weight trend: stable Wt Readings from Last 3 Encounters:  07/02/18 213 lb (96.6 kg)  03/29/18 212 lb (96.2 kg)  03/15/18 211 lb (95.7 kg)    Current diet: in general, a "healthy" diet    ------------------------------------------------------------------------ She states she has had more anxiety about the Covid-19 epidemic and IBS has been a little worse. She has not eating as healthy and is consuming more salty foods, but is exercising every day. Allergies have been bad, but doing well with Zyrtec and montelukast. No change in breathing, rarely needing albuterol, using Advair twice every day consistently.    Allergies  Allergen Reactions  . Gentamicin Sulfate      Current Outpatient Medications:  .  ADVAIR DISKUS 250-50 MCG/DOSE AEPB, Inhale 1 puff into the lungs 2 (two) times daily., Disp: 180 each, Rfl: 3 .  albuterol (PROVENTIL HFA;VENTOLIN HFA) 108 (90 Base) MCG/ACT  inhaler, Inhale 1-2 puffs into the lungs every 4 (four) hours as needed for wheezing or shortness of breath., Disp: 3 Inhaler, Rfl: 3 .  amLODipine (NORVASC) 5 MG tablet, TAKE ONE TABLET EVERY DAY, Disp: 30 tablet, Rfl: 0 .  cetirizine (ZYRTEC) 10 MG tablet, Take by mouth., Disp: , Rfl:  .  EQL MAGNESIUM CITRATE PO, Take 500 mg by mouth 2 (two) times daily., Disp: , Rfl:  .  FLUTICASONE PROPIONATE, NASAL, NA, Place into the nose., Disp: , Rfl:  .  hyoscyamine (LEVBID) 0.375 MG 12 hr tablet, Take 1 tablet (0.375 mg total) by mouth 2 (two) times daily., Disp: 60 tablet, Rfl: 3 .  montelukast (SINGULAIR) 10 MG tablet, Take 1 tablet (10 mg total) by mouth at bedtime., Disp: 90 tablet, Rfl: 1 .  naproxen sodium (ALEVE) 220 MG tablet, Take 220 mg by mouth 2 (two) times daily as needed., Disp: , Rfl:  .  potassium chloride SA (K-DUR,KLOR-CON) 20 MEQ tablet, TAKE ONE TABLET TWICE DAILY, Disp: 180 tablet, Rfl: 4 .  SYNTHROID 125 MCG tablet, TAKE ONE TABLET EVERY DAY, Disp: 90 tablet, Rfl: 2 .  triamterene-hydrochlorothiazide (MAXZIDE-25) 37.5-25 MG tablet, TAKE ONE TABLET EVERY DAY, Disp: 90 tablet, Rfl: 4 .  cyclobenzaprine (FLEXERIL) 5 MG tablet, Take 1-2 tablets (5-10 mg total) by mouth 3 (three) times daily as needed for muscle spasms. (Patient not taking: Reported on 07/02/2018), Disp: 30 tablet, Rfl: 1 .  metroNIDAZOLE (METROGEL) 0.75 % vaginal gel, Place 1 Applicatorful vaginally at  bedtime. (Patient not taking: Reported on 07/02/2018), Disp: 70 g, Rfl: 0  Review of Systems  Constitutional: Negative.   Respiratory: Negative.   Cardiovascular: Positive for leg swelling. Negative for chest pain and palpitations.  Gastrointestinal: Positive for constipation and diarrhea. Negative for abdominal distention, abdominal pain, anal bleeding, blood in stool, nausea, rectal pain and vomiting.       Pt states her IBS has flared recently.   Neurological: Negative for dizziness, light-headedness and headaches.     Social History   Tobacco Use  . Smoking status: Never Smoker  . Smokeless tobacco: Never Used  Substance Use Topics  . Alcohol use: Yes    Comment: once monthly      Objective:    Vitals:   07/02/18 0937 07/02/18 0957  BP: (!) 151/83 140/74  Pulse: 87   Temp: 98.7 F (37.1 C)   TempSrc: Oral   Weight: 213 lb (96.6 kg)      Physical Exam   General Appearance:    Alert, cooperative, no distress  Eyes:    PERRL, conjunctiva/corneas clear, EOM's intact       Lungs:     Clear to auscultation bilaterally, respirations unlabored  Heart:    Regular rate and rhythm  Neurologic:   Awake, alert, oriented x 3. No apparent focal neurological           defect.         Assessment & Plan    1. Essential hypertension Doing well current medications. Some room for improvement with diet. Continue current medications for now. Recheck at cpe in august.   2. Seasonal allergic rhinitis due to pollen Continue zyrtec and montelukast    3. Moderate asthma without complication, unspecified whether persistent Well controlled.  Continue current medications.    4. Situational stress Encouraged regular exercise and not to get to hung up on news about Covid-19, but to strictly follow social distancing guidelines.   5. IBS A little worse with anxiety about Covd-19. Continue current medications.  Consider TCA if worsens.     Lelon Huh, MD  [facility]

## 2018-07-06 ENCOUNTER — Telehealth: Payer: Self-pay | Admitting: Gastroenterology

## 2018-07-06 NOTE — Telephone Encounter (Signed)
Pt is calling to speak with office Manager regarding a [place] bill and coding issue please call pt at [phone] she states her procedure was supposed to be covered at a 100%.

## 2018-07-07 ENCOUNTER — Ambulatory Visit: Payer: BC Managed Care – PPO | Admitting: Family Medicine

## 2018-07-07 NOTE — Telephone Encounter (Signed)
Pt is calling for Angela Villarreal regarding previous message.

## 2018-07-12 NOTE — Telephone Encounter (Signed)
Pt is calling regarding prev. Messages . She states [employer] had informed her that this would be a preventative procedure I informed her that her PCP sent this referral as a Family history and she also had Polyps removed We went over the codes and the code that is being charged towards her deductible is 45390 she needs clarification on this code  Please call pt

## 2018-07-13 NOTE — Telephone Encounter (Signed)
Jody can you look into this?

## 2018-07-22 ENCOUNTER — Other Ambulatory Visit: Payer: Self-pay | Admitting: Family Medicine

## 2018-07-22 DIAGNOSIS — I10 Essential (primary) hypertension: Secondary | ICD-10-CM

## 2018-08-12 ENCOUNTER — Ambulatory Visit (INDEPENDENT_AMBULATORY_CARE_PROVIDER_SITE_OTHER): Payer: BC Managed Care – PPO | Admitting: Family Medicine

## 2018-08-12 VITALS — Temp 98.6°F

## 2018-08-12 DIAGNOSIS — J039 Acute tonsillitis, unspecified: Secondary | ICD-10-CM | POA: Diagnosis not present

## 2018-08-12 MED ORDER — CEFDINIR 300 MG PO CAPS: 300.0000 mg | ORAL_CAPSULE | Freq: Two times a day (BID) | ORAL | 0 refills | Status: AC

## 2018-08-12 NOTE — Progress Notes (Signed)
Patient: Angela Villarreal Female    DOB: September 02, 1955   63 y.o.   MRN: [id number] Visit Date: 08/12/2018  Today's Provider: [name], MD   Chief Complaint  Patient presents with  . URI   Subjective:    Virtual Visit via Video Note  I connected with FAREEHA EVON on 08/12/18 at  2:20 PM EDT by a video enabled telemedicine application and verified that I am speaking with the correct person using two identifiers.   Patient location: home Provider location: [place] involved in the visit: patient, provider   I discussed the limitations of evaluation and management by telemedicine and the availability of in person appointments. The patient expressed understanding and agreed to proceed.  URI   This is a new problem. Associated symptoms include coughing, ear pain (fullness) and a sore throat.   3-4 day duration Initially attributed it to allergic rhinitis - taking several medications for this Yesterday seemed to worsen significantly Looked into OP and could see white bumps Still has tonsils Has had strep throat before and this does not feel as bad Has been watchign her grandson who is 34 months old  Allergies  Allergen Reactions  . Gentamicin Sulfate      Current Outpatient Medications:  .  ADVAIR DISKUS 250-50 MCG/DOSE AEPB, Inhale 1 puff into the lungs 2 (two) times daily., Disp: 180 each, Rfl: 3 .  albuterol (PROVENTIL HFA;VENTOLIN HFA) 108 (90 Base) MCG/ACT inhaler, Inhale 1-2 puffs into the lungs every 4 (four) hours as needed for wheezing or shortness of breath., Disp: 3 Inhaler, Rfl: 3 .  amLODipine (NORVASC) 5 MG tablet, TAKE 1 TABLET BY MOUTH DAILY, Disp: 30 tablet, Rfl: 5 .  cetirizine (ZYRTEC) 10 MG tablet, Take by mouth., Disp: , Rfl:  .  EQL MAGNESIUM CITRATE PO, Take 500 mg by mouth 2 (two) times daily., Disp: , Rfl:  .  FLUTICASONE PROPIONATE, NASAL, NA, Place into the nose., Disp: , Rfl:  .  hyoscyamine (LEVBID) 0.375  MG 12 hr tablet, Take 1 tablet (0.375 mg total) by mouth 2 (two) times daily., Disp: 60 tablet, Rfl: 3 .  montelukast (SINGULAIR) 10 MG tablet, Take 1 tablet (10 mg total) by mouth at bedtime., Disp: 90 tablet, Rfl: 1 .  naproxen sodium (ALEVE) 220 MG tablet, Take 220 mg by mouth 2 (two) times daily as needed., Disp: , Rfl:  .  potassium chloride SA (K-DUR,KLOR-CON) 20 MEQ tablet, TAKE ONE TABLET TWICE DAILY, Disp: 180 tablet, Rfl: 4 .  SYNTHROID 125 MCG tablet, TAKE ONE TABLET EVERY DAY, Disp: 90 tablet, Rfl: 2 .  triamterene-hydrochlorothiazide (MAXZIDE-25) 37.5-25 MG tablet, TAKE ONE TABLET EVERY DAY, Disp: 90 tablet, Rfl: 4 .  cyclobenzaprine (FLEXERIL) 5 MG tablet, Take 1-2 tablets (5-10 mg total) by mouth 3 (three) times daily as needed for muscle spasms. (Patient not taking: Reported on 07/02/2018), Disp: 30 tablet, Rfl: 1  Review of Systems  Constitutional: Negative.  Negative for fever.  HENT: Positive for ear pain (fullness) and sore throat.   Respiratory: Positive for cough.   Cardiovascular: Negative.     Social History   Tobacco Use  . Smoking status: Never Smoker  . Smokeless tobacco: Never Used  Substance Use Topics  . Alcohol use: Yes    Comment: once monthly      Objective:   There were no vitals taken for this visit. There were no vitals filed for this visit.   Physical Exam  Constitutional:      Appearance: Normal appearance.  HENT:     Head: Normocephalic and atraumatic.     Nose: Nose normal.     Mouth/Throat:     Mouth: Mucous membranes are moist.     Pharynx: Oropharyngeal exudate (white exudative plaques on bilateral tonsils) present.  Eyes:     Conjunctiva/sclera: Conjunctivae normal.  Pulmonary:     Effort: Pulmonary effort is normal. No respiratory distress.  Neurological:     Mental Status: She is alert and oriented to person, place, and time.  Psychiatric:        Mood and Affect: Mood normal.        Behavior: Behavior normal.         Assessment & Plan    I discussed the assessment and treatment plan with the patient. The patient was provided an opportunity to ask questions and all were answered. The patient agreed with the plan and demonstrated an understanding of the instructions.   The patient was advised to call back or seek an in-person evaluation if the symptoms worsen or if the condition fails to improve as anticipated.  1. Tonsillitis -Patient with tonsillitis on exam -She also has some ear pain, but unable to evaluate for AOM on video visit - Discussed that she could have strep pharyngitis, but unable to test at this time -We will treat her empirically with a course of cefdinir -Patient states that she has had a lot of GI distress on Augmentin in the past, so we will not use that -Discussed with patient that even if she did have AOM, this course of cefdinir would treat that as well - Advised on strict return precautions and symptomatic management - Also discussed with patient that suspicion for COVID-19 infection is low    Meds ordered this encounter  Medications  . cefdinir (OMNICEF) 300 MG capsule    Sig: Take 1 capsule (300 mg total) by mouth 2 (two) times daily for 7 days.    Dispense:  14 capsule    Refill:  0     Return if symptoms worsen or fail to improve.   The entirety of the information documented in the History of Present Illness, Review of Systems and Physical Exam were personally obtained by me. Portions of this information were initially documented by Tiburcio Pea, CMA and reviewed by me for thoroughness and accuracy.    , Dionne Bucy, MD MPH [facility]

## 2018-08-20 ENCOUNTER — Telehealth: Payer: Self-pay

## 2018-08-20 NOTE — Telephone Encounter (Signed)
Patient states that she had an e-visit last Thursday and she has completed the antibiotics, but she is still having ear pain, soreness and redness on the left side of her throat. The white bumps are gone, but she is not feeling any better. Please advise.

## 2018-08-20 NOTE — Telephone Encounter (Signed)
PCP changed and CPE scheduled.

## 2018-08-20 NOTE — Telephone Encounter (Signed)
This sounds like post-nasal drip and possible eustachian tube dysfunction.  Is she taking flonase and antihistamine regularly?  If she is worried about it, ok to make in person visit for someone to look at her ears and throat.  Would need to be brought to room and not waiting in waiting room

## 2018-08-20 NOTE — Telephone Encounter (Signed)
I think we do allow that for previous [facility] patients. Could move her next CPE or f/u visit to be with me if she likes.

## 2018-08-20 NOTE — Telephone Encounter (Signed)
Patient advised. She states she will call back to Monday to schedule a OV if no better.  Patient also wants to know if she can switch care to you. She was previously Dr. Sharyon Medicus patient, then started seeing Dr.Fisher. Please advise.

## 2018-09-01 ENCOUNTER — Telehealth: Payer: Self-pay | Admitting: Family Medicine

## 2018-09-01 DIAGNOSIS — J039 Acute tonsillitis, unspecified: Secondary | ICD-10-CM

## 2018-09-01 MED ORDER — CEFDINIR 300 MG PO CAPS: 300.0000 mg | ORAL_CAPSULE | Freq: Two times a day (BID) | ORAL | 0 refills | Status: AC

## 2018-09-01 NOTE — Telephone Encounter (Signed)
Patient advised and agrees with treatment plan. Prescription sent into pharmacy.  ?

## 2018-09-01 NOTE — Telephone Encounter (Signed)
Pt called saying she has been having sore throat and ear pain for about a month now. She did a virtual visit with her and prescribed and antibiotic.  She was feeling better but it has now came back.  With white bumps on her throat and ear pain.  She is a patient of [facility] Dr. Tami Ribas  Should she check with him...  Please advise [phone]  Thanks Con Memos

## 2018-09-01 NOTE — Telephone Encounter (Signed)
Can send refill for cefdinir 300mg  twice a day for 7 days. Call ENT if not much better over the weekend.

## 2018-09-07 ENCOUNTER — Encounter: Payer: Self-pay | Admitting: Family Medicine

## 2018-11-10 ENCOUNTER — Encounter: Payer: BC Managed Care – PPO | Admitting: Family Medicine

## 2018-11-11 ENCOUNTER — Other Ambulatory Visit: Payer: Self-pay | Admitting: Family Medicine

## 2018-11-11 DIAGNOSIS — E039 Hypothyroidism, unspecified: Secondary | ICD-10-CM

## 2018-12-21 ENCOUNTER — Telehealth: Payer: Self-pay

## 2018-12-21 NOTE — Telephone Encounter (Signed)
Pt has a physical scheduled with you tomorrow (12/22/2018).  She is complaining of some sinus pressure/allergy flare.  She denies fevers, (Temp 97.6 right now) cough, shortness of breath, loss of taste or smell.  She is being followed by Dr. Tami Ribas for her allergies and he has made a few changes.  Increased her Flonase, Advair, and changed her to [facility] from [place].    Is it okay for her to come in tomorrow or does she need to reschedule?  Thanks,   -Mickel Baas

## 2018-12-22 ENCOUNTER — Encounter: Payer: BC Managed Care – PPO | Admitting: Family Medicine

## 2018-12-22 NOTE — Telephone Encounter (Signed)
I believe Evlyn Clines spoke to her this morning about this.

## 2018-12-22 NOTE — Telephone Encounter (Signed)
Yes and patient physical was reschedule until a later date.

## 2018-12-27 ENCOUNTER — Other Ambulatory Visit: Payer: Self-pay | Admitting: Family Medicine

## 2018-12-27 DIAGNOSIS — I10 Essential (primary) hypertension: Secondary | ICD-10-CM

## 2019-01-05 ENCOUNTER — Ambulatory Visit (INDEPENDENT_AMBULATORY_CARE_PROVIDER_SITE_OTHER): Payer: BC Managed Care – PPO | Admitting: Family Medicine

## 2019-01-05 ENCOUNTER — Encounter: Payer: Self-pay | Admitting: Family Medicine

## 2019-01-05 ENCOUNTER — Other Ambulatory Visit: Payer: Self-pay

## 2019-01-05 VITALS — BP 127/77 | HR 90 | Temp 97.1°F | Ht 66.0 in | Wt 210.0 lb

## 2019-01-05 DIAGNOSIS — Z0000 Encounter for general adult medical examination without abnormal findings: Secondary | ICD-10-CM | POA: Diagnosis not present

## 2019-01-05 DIAGNOSIS — Z1231 Encounter for screening mammogram for malignant neoplasm of breast: Secondary | ICD-10-CM

## 2019-01-05 DIAGNOSIS — E782 Mixed hyperlipidemia: Secondary | ICD-10-CM

## 2019-01-05 DIAGNOSIS — Z23 Encounter for immunization: Secondary | ICD-10-CM | POA: Diagnosis not present

## 2019-01-05 DIAGNOSIS — R739 Hyperglycemia, unspecified: Secondary | ICD-10-CM

## 2019-01-05 DIAGNOSIS — I10 Essential (primary) hypertension: Secondary | ICD-10-CM | POA: Diagnosis not present

## 2019-01-05 DIAGNOSIS — E039 Hypothyroidism, unspecified: Secondary | ICD-10-CM | POA: Diagnosis not present

## 2019-01-05 DIAGNOSIS — Z6833 Body mass index (BMI) 33.0-33.9, adult: Secondary | ICD-10-CM

## 2019-01-05 DIAGNOSIS — Z8249 Family history of ischemic heart disease and other diseases of the circulatory system: Secondary | ICD-10-CM

## 2019-01-05 DIAGNOSIS — E669 Obesity, unspecified: Secondary | ICD-10-CM

## 2019-01-05 DIAGNOSIS — I872 Venous insufficiency (chronic) (peripheral): Secondary | ICD-10-CM

## 2019-01-05 MED ORDER — METOPROLOL SUCCINATE ER 25 MG PO TB24: 25.0000 mg | ORAL_TABLET | Freq: Every day | ORAL | 3 refills | Status: DC

## 2019-01-05 NOTE — Patient Instructions (Signed)
Preventive Care 63-64 Years Old, Female Preventive care refers to visits with your health care provider and lifestyle choices that can promote health and wellness. This includes:  A yearly physical exam. This may also be called an annual well check.  Regular dental visits and eye exams.  Immunizations.  Screening for certain conditions.  Healthy lifestyle choices, such as eating a healthy diet, getting regular exercise, not using drugs or products that contain nicotine and tobacco, and limiting alcohol use. What can I expect for my preventive care visit? Physical exam Your health care provider will check your:  Height and weight. This may be used to calculate body mass index (BMI), which tells if you are at a healthy weight.  Heart rate and blood pressure.  Skin for abnormal spots. Counseling Your health care provider may ask you questions about your:  Alcohol, tobacco, and drug use.  Emotional well-being.  Home and relationship well-being.  Sexual activity.  Eating habits.  Work and work environment.  Method of birth control.  Menstrual cycle.  Pregnancy history. What immunizations do I need?  Influenza (flu) vaccine  This is recommended every year. Tetanus, diphtheria, and pertussis (Tdap) vaccine  You may need a Td booster every 10 years. Varicella (chickenpox) vaccine  You may need this if you have not been vaccinated. Zoster (shingles) vaccine  You may need this after age 63. Measles, mumps, and rubella (MMR) vaccine  You may need at least one dose of MMR if you were born in 1957 or later. You may also need a second dose. Pneumococcal conjugate (PCV13) vaccine  You may need this if you have certain conditions and were not previously vaccinated. Pneumococcal polysaccharide (PPSV23) vaccine  You may need one or two doses if you smoke cigarettes or if you have certain conditions. Meningococcal conjugate (MenACWY) vaccine  You may need this if you  have certain conditions. Hepatitis A vaccine  You may need this if you have certain conditions or if you travel or work in places where you may be exposed to hepatitis A. Hepatitis B vaccine  You may need this if you have certain conditions or if you travel or work in places where you may be exposed to hepatitis B. Haemophilus influenzae type b (Hib) vaccine  You may need this if you have certain conditions. Human papillomavirus (HPV) vaccine  If recommended by your health care provider, you may need three doses over 6 months. You may receive vaccines as individual doses or as more than one vaccine together in one shot (combination vaccines). Talk with your health care provider about the risks and benefits of combination vaccines. What tests do I need? Blood tests  Lipid and cholesterol levels. These may be checked every 5 years, or more frequently if you are over 63 years old.  Hepatitis C test.  Hepatitis B test. Screening  Lung cancer screening. You may have this screening every year starting at age 63 if you have a 30-pack-year history of smoking and currently smoke or have quit within the past 15 years.  Colorectal cancer screening. All adults should have this screening starting at age 63 and continuing until age 75. Your health care provider may recommend screening at age 45 if you are at increased risk. You will have tests every 1-10 years, depending on your results and the type of screening test.  Diabetes screening. This is done by checking your blood sugar (glucose) after you have not eaten for a while (fasting). You may have this   done every 1-3 years.  Mammogram. This may be done every 1-2 years. Talk with your health care provider about when you should start having regular mammograms. This may depend on whether you have a family history of breast cancer.  BRCA-related cancer screening. This may be done if you have a family history of breast, ovarian, tubal, or peritoneal  cancers.  Pelvic exam and Pap test. This may be done every 3 years starting at age 63. Starting at age 63, this may be done every 5 years if you have a Pap test in combination with an HPV test. Other tests  Sexually transmitted disease (STD) testing.  Bone density scan. This is done to screen for osteoporosis. You may have this scan if you are at high risk for osteoporosis. Follow these instructions at home: Eating and drinking  Eat a diet that includes fresh fruits and vegetables, whole grains, lean protein, and low-fat dairy.  Take vitamin and mineral supplements as recommended by your health care provider.  Do not drink alcohol if: ? Your health care provider tells you not to drink. ? You are pregnant, may be pregnant, or are planning to become pregnant.  If you drink alcohol: ? Limit how much you have to 0-1 drink a day. ? Be aware of how much alcohol is in your drink. In the [place] one drink equals one 12 oz bottle of beer (355 mL), one 5 oz glass of wine (148 mL), or one 1 oz glass of hard liquor (44 mL). Lifestyle  Take daily care of your teeth and gums.  Stay active. Exercise for at least 30 minutes on 5 or more days each week.  Do not use any products that contain nicotine or tobacco, such as cigarettes, e-cigarettes, and chewing tobacco. If you need help quitting, ask your health care provider.  If you are sexually active, practice safe sex. Use a condom or other form of birth control (contraception) in order to prevent pregnancy and STIs (sexually transmitted infections).  If told by your health care provider, take low-dose aspirin daily starting at age 63. What's next?  Visit your health care provider once a year for a well check visit.  Ask your health care provider how often you should have your eyes and teeth checked.  Stay up to date on all vaccines. This information is not intended to replace advice given to you by your health care provider. Make sure you  discuss any questions you have with your health care provider. Document Released: 03/30/2015 Document Revised: 11/12/2017 Document Reviewed: 11/12/2017 Elsevier Patient Education  2020 [employer]  

## 2019-01-05 NOTE — Assessment & Plan Note (Signed)
Not currently on a statin Recheck lipid panel and CMP We will calculate ASCVD risk and determine need for statin, especially with the family history of cardiomyopathy 

## 2019-01-05 NOTE — Progress Notes (Signed)
Patient: Angela Villarreal, Female    DOB: 07/22/1955, 63 y.o.   MRN: [id number] Visit Date: 01/05/2019  Today's Provider: [name], MD   Chief Complaint  Patient presents with  . Annual Exam   Subjective:     Annual physical exam Angela Villarreal is a 63 y.o. female who presents today for health maintenance and complete physical. She feels fairly well. Pt states her blood pressure has been running high at home.  She tried to buy a good BP cuff and would be disappointed if it wasn't accurate. Pt is also having trouble with anxiety.  She reports exercising some.  She plans to get a fit bit for her birthday to increase step count. She reports she is sleeping well.  Reports swelling of bilateral legs.  Tried compression hose, but worsened during the summer and she had stopped wearing compression stockings. Had eval by Dr Lucky Cowboy previously that was benign.  She thinks this may have worsened since starting amlodipine.  -----------------------------------------------------------------   Review of Systems  Constitutional: Negative.   HENT: Negative.   Eyes: Negative.   Respiratory: Negative.   Cardiovascular: Positive for leg swelling. Negative for chest pain and palpitations.  Gastrointestinal: Negative.   Endocrine: Negative.   Genitourinary: Negative.   Musculoskeletal: Negative.   Skin: Negative.   Allergic/Immunologic: Positive for environmental allergies. Negative for food allergies and immunocompromised state.  Neurological: Negative.   Hematological: Negative.   Psychiatric/Behavioral: Negative for agitation, behavioral problems, confusion, decreased concentration, dysphoric mood, hallucinations, self-injury, sleep disturbance and suicidal ideas. The patient is nervous/anxious. The patient is not hyperactive.     Social History      She  reports that she has never smoked. She has never used smokeless tobacco. She reports current alcohol use. She reports that she  does not use drugs.       Social History   Socioeconomic History  . Marital status: Divorced    Spouse name: Not on file  . Number of children: Not on file  . Years of education: Not on file  . Highest education level: Not on file  Occupational History  . Not on file  Social Needs  . Financial resource strain: Not on file  . Food insecurity    Worry: Not on file    Inability: Not on file  . Transportation needs    Medical: Not on file    Non-medical: Not on file  Tobacco Use  . Smoking status: Never Smoker  . Smokeless tobacco: Never Used  Substance and Sexual Activity  . Alcohol use: Yes    Comment: once monthly  . Drug use: No  . Sexual activity: Not on file  Lifestyle  . Physical activity    Days per week: Not on file    Minutes per session: Not on file  . Stress: Not on file  Relationships  . Social Herbalist on phone: Not on file    Gets together: Not on file    Attends religious service: Not on file    Active member of club or organization: Not on file    Attends meetings of clubs or organizations: Not on file    Relationship status: Not on file  Other Topics Concern  . Not on file  Social History Narrative  . Not on file    Past Medical History:  Diagnosis Date  . Asthma   . 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 Hypertension   .  Hypothyroidism      Patient Active Problem List   Diagnosis Date Noted  . History of adenomatous polyp of colon 12/01/2017  . Swelling of limb 10/07/2016  . Varicose veins of bilateral lower extremities with pain 09/04/2016  . History of malignant melanoma 09/04/2016  . IBS (irritable bowel syndrome) 09/04/2015  . Hypokalemia 03/05/2015  . Post-menopausal 11/22/2014  . Allergic rhinitis 08/17/2014  . Airway hyperreactivity 08/17/2014  . Family history of cardiomyopathy 08/17/2014  . Essential hypertension 08/17/2014  . Adult hypothyroidism 08/17/2014  . Obesity 08/17/2014  . Arthritis,  degenerative 08/17/2014  . Cardiac murmur 07/18/2014  . Hyperlipemia, mixed 07/17/2014    Past Surgical History:  Procedure Laterality Date  . ABDOMINAL HYSTERECTOMY  2007   due to fibroids. patient reports she also had ovaries removed.   Marland Kitchen BACK SURGERY  1989, 2000  . BLADDER SURGERY  2001   Tacked  . COLONOSCOPY WITH PROPOFOL N/A 11/27/2017   Procedure: COLONOSCOPY WITH PROPOFOL;  Surgeon: Jonathon Bellows, MD;  Location: [facility]COPY;  Service: Gastroenterology;  Laterality: N/A;  . MELANOMA EXCISION  02/2004   removed from face    Family History        Family Status  Relation Name Status  . Mother  Deceased  . Father  Deceased  . Sister  Alive  . Neg Hx  (Not Specified)        Her family history includes Alzheimer's disease in her mother; Cancer in her mother; Colon cancer in her mother; Non-Hodgkin's lymphoma in her father; Squamous cell carcinoma in her mother; Thyroid disease in her sister. There is no history of Breast cancer.      Allergies  Allergen Reactions  . Gentamicin Sulfate      Current Outpatient Medications:  .  albuterol (PROVENTIL HFA;VENTOLIN HFA) 108 (90 Base) MCG/ACT inhaler, Inhale 1-2 puffs into the lungs every 4 (four) hours as needed for wheezing or shortness of breath., Disp: 3 Inhaler, Rfl: 3 .  amLODipine (NORVASC) 5 MG tablet, TAKE ONE TABLET BY MOUTH EVERY DAY, Disp: 90 tablet, Rfl: 2 .  cetirizine (ZYRTEC) 10 MG tablet, Take by mouth., Disp: , Rfl:  .  fexofenadine (ALLEGRA) 180 MG tablet, Take 180 mg by mouth daily., Disp: , Rfl:  .  FLUTICASONE PROPIONATE, NASAL, NA, Place into the nose., Disp: , Rfl:  .  fluticasone-salmeterol (ADVAIR HFA) 115-21 MCG/ACT inhaler, Inhale 2 puffs into the lungs 2 (two) times daily., Disp: , Rfl:  .  hyoscyamine (LEVBID) 0.375 MG 12 hr tablet, Take 1 tablet (0.375 mg total) by mouth 2 (two) times daily., Disp: 60 tablet, Rfl: 3 .  montelukast (SINGULAIR) 10 MG tablet, Take 1 tablet (10 mg total) by mouth at  bedtime., Disp: 90 tablet, Rfl: 1 .  naproxen sodium (ALEVE) 220 MG tablet, Take 220 mg by mouth 2 (two) times daily as needed., Disp: , Rfl:  .  potassium chloride SA (K-DUR,KLOR-CON) 20 MEQ tablet, TAKE ONE TABLET TWICE DAILY, Disp: 180 tablet, Rfl: 4 .  SYNTHROID 125 MCG tablet, TAKE ONE TABLET EVERY DAY, Disp: 90 tablet, Rfl: 2 .  triamterene-hydrochlorothiazide (MAXZIDE-25) 37.5-25 MG tablet, TAKE 1 TABLET BY MOUTH DAILY, Disp: 90 tablet, Rfl: 2 .  ADVAIR DISKUS 250-50 MCG/DOSE AEPB, Inhale 1 puff into the lungs 2 (two) times daily., Disp: 180 each, Rfl: 3 .  cyclobenzaprine (FLEXERIL) 5 MG tablet, Take 1-2 tablets (5-10 mg total) by mouth 3 (three) times daily as needed for muscle spasms. (Patient not taking: Reported on  07/02/2018), Disp: 30 tablet, Rfl: 1 .  EQL MAGNESIUM CITRATE PO, Take 500 mg by mouth 2 (two) times daily., Disp: , Rfl:  .  omeprazole (PRILOSEC) 40 MG capsule, Take 40 mg by mouth daily., Disp: , Rfl:    Patient Care Team: Virginia Crews, MD as PCP - General (Family Medicine) Ralene Bathe, MD (Dermatology) Beverly Gust, MD (Otolaryngology) Jonathon Bellows, MD as Consulting Physician (Gastroenterology)    Objective:    Vitals: BP 127/77 (BP Location: Right Arm, Patient Position: Sitting, Cuff Size: Large)   Pulse 90   Temp (!) 97.1 F (36.2 C) (Temporal)   Ht 5\' 6"  (1.676 m)   Wt 210 lb (95.3 kg)   BMI 33.89 kg/m    Vitals:   01/05/19 1326  BP: 127/77  Pulse: 90  Temp: (!) 97.1 F (36.2 C)  TempSrc: Temporal  Weight: 210 lb (95.3 kg)  Height: 5\' 6"  (1.676 m)     Physical Exam Vitals signs reviewed.  Constitutional:      General: She is not in acute distress.    Appearance: Normal appearance. She is well-developed. She is not diaphoretic.  HENT:     Head: Normocephalic and atraumatic.     Right Ear: Tympanic membrane, ear canal and external ear normal.     Left Ear: Tympanic membrane, ear canal and external ear normal.  Eyes:      General: No scleral icterus.    Conjunctiva/sclera: Conjunctivae normal.     Pupils: Pupils are equal, round, and reactive to light.  Neck:     Musculoskeletal: Neck supple.     Thyroid: No thyromegaly.  Cardiovascular:     Rate and Rhythm: Normal rate and regular rhythm.     Pulses: Normal pulses.     Heart sounds: Normal heart sounds. No murmur.  Pulmonary:     Effort: Pulmonary effort is normal. No respiratory distress.     Breath sounds: Normal breath sounds. No wheezing or rales.  Abdominal:     General: There is no distension.     Palpations: Abdomen is soft.     Tenderness: There is no abdominal tenderness.  Genitourinary:    Comments: Breasts: breasts appear normal, no suspicious masses, no skin or nipple changes or axillary nodes.  Musculoskeletal:        General: No deformity.     Right lower leg: Edema present.     Left lower leg: Edema present.     Comments: Varicose veins diffusely over bilateral lower extremities  Lymphadenopathy:     Cervical: No cervical adenopathy.  Skin:    General: Skin is warm and dry.     Capillary Refill: Capillary refill takes less than 2 seconds.     Findings: No rash.  Neurological:     Mental Status: She is alert and oriented to person, place, and time. Mental status is at baseline.  Psychiatric:        Mood and Affect: Mood normal.        Behavior: Behavior normal.        Thought Content: Thought content normal.      Depression Screen PHQ 2/9 Scores 11/03/2017 09/04/2016 09/04/2015  PHQ - 2 Score 0 0 0  PHQ- 9 Score 1 3 -       Assessment & Plan:     Routine Health Maintenance and Physical Exam  Exercise Activities and Dietary recommendations Goals   None     Immunization History  Administered Date(s) Administered  .  Influenza,inj,Quad PF,6+ Mos 01/05/2018  . Influenza-Unspecified 01/02/2017  . Tdap 04/15/2010  . Zoster Recombinat (Shingrix) 11/03/2017, 01/05/2018    Health Maintenance  Topic Date Due  .  INFLUENZA VACCINE  10/16/2018  . MAMMOGRAM  10/28/2019  . TETANUS/TDAP  04/15/2020  . COLONOSCOPY  11/27/2020  . Hepatitis C Screening  Completed  . HIV Screening  Completed     Discussed health benefits of physical activity, and encouraged her to engage in regular exercise appropriate for her age and condition.    --------------------------------------------------------------------  Problem List Items Addressed This Visit      Cardiovascular and Mediastinum   Essential hypertension    Well controlled, but having increase in lower extremity swelling Continue triamterene HCTZ at current dose Stop amlodipine Start metoprolol 25 mg daily Could consider changing triamterene to an ACE inhibitor or ARB in the future Recheck metabolic panel Advised to bring her home blood pressure cuff to her next visit so we can compare it to ours F/u in 1 months       Relevant Medications   metoprolol succinate (TOPROL-XL) 25 MG 24 hr tablet   Other Relevant Orders   Comprehensive metabolic panel   Venous insufficiency of both lower extremities    Chronic Possibly worsening since starting amlodipine We will stop this as above Discussed that this will not resolve her swelling likely as she does still have venous insufficiency She may also have some component of lymphedema after an injury to the left lower extremity in the past Advised compression stockings daily, elevation when at home and able to rest      Relevant Medications   metoprolol succinate (TOPROL-XL) 25 MG 24 hr tablet     Endocrine   Adult hypothyroidism    Previously well controlled Continue Synthroid at current dose  Recheck TSH and adjust Synthroid as indicated       Relevant Medications   metoprolol succinate (TOPROL-XL) 25 MG 24 hr tablet   Other Relevant Orders   TSH     Other   Family history of cardiomyopathy    Discussed importance of good risk factor management      Relevant Orders   Lipid panel    Comprehensive metabolic panel   Hyperlipemia, mixed    Not currently on a statin Recheck lipid panel and CMP We will calculate ASCVD risk and determine need for statin, especially with the family history of cardiomyopathy      Relevant Medications   metoprolol succinate (TOPROL-XL) 25 MG 24 hr tablet   Other Relevant Orders   Lipid panel   Comprehensive metabolic panel   Obesity    Discussed importance of healthy weight management Discussed diet and exercise        Other Visit Diagnoses    Encounter for annual physical exam    -  Primary   Relevant Orders   Lipid panel   Comprehensive metabolic panel   TSH   MM 3D SCREEN BREAST BILATERAL   Hemoglobin A1c   Encounter for screening mammogram for malignant neoplasm of breast       Relevant Orders   MM 3D SCREEN BREAST BILATERAL   Hyperglycemia       Relevant Orders   Hemoglobin A1c   Flu vaccine need       Relevant Orders   Flu Vaccine QUAD 6+ mos PF IM (Fluarix Quad PF) (Completed)       Return in about 4 weeks (around 02/02/2019) for BP f/u.   The entirety  of the information documented in the History of Present Illness, Review of Systems and Physical Exam were personally obtained by me. Portions of this information were initially documented by Ashley Royalty, CMA and reviewed by me for thoroughness and accuracy.    Laysha Childers, Dionne Bucy, MD MPH [facility]

## 2019-01-05 NOTE — Assessment & Plan Note (Signed)
Chronic Possibly worsening since starting amlodipine We will stop this as above Discussed that this will not resolve her swelling likely as she does still have venous insufficiency She may also have some component of lymphedema after an injury to the left lower extremity in the past Advised compression stockings daily, elevation when at home and able to rest

## 2019-01-05 NOTE — Assessment & Plan Note (Signed)
Discussed importance of good risk factor management

## 2019-01-05 NOTE — Assessment & Plan Note (Signed)
Well controlled, but having increase in lower extremity swelling Continue triamterene HCTZ at current dose Stop amlodipine Start metoprolol 25 mg daily Could consider changing triamterene to an ACE inhibitor or ARB in the future Recheck metabolic panel Advised to bring her home blood pressure cuff to her next visit so we can compare it to ours F/u in 1 months

## 2019-01-05 NOTE — Assessment & Plan Note (Signed)
Previously well controlled Continue Synthroid at current dose  Recheck TSH and adjust Synthroid as indicated   

## 2019-01-05 NOTE — Assessment & Plan Note (Signed)
Discussed importance of healthy weight management Discussed diet and exercise  

## 2019-01-08 LAB — LIPID PANEL
Chol/HDL Ratio: 4.6 ratio — ABNORMAL HIGH (ref 0.0–4.4)
Cholesterol, Total: 193 mg/dL (ref 100–199)
HDL: 42 mg/dL (ref >39)
LDL Chol Calc (NIH): 129 mg/dL — ABNORMAL HIGH (ref 0–99)
Triglycerides: 120 mg/dL (ref 0–149)
VLDL Cholesterol Cal: 22 mg/dL (ref 5–40)

## 2019-01-08 LAB — COMPREHENSIVE METABOLIC PANEL
ALT: 21 IU/L (ref 0–32)
AST: 18 IU/L (ref 0–40)
Albumin/Globulin Ratio: 1.6 (ref 1.2–2.2)
Albumin: 4.1 g/dL (ref 3.8–4.8)
Alkaline Phosphatase: 90 IU/L (ref 39–117)
BUN/Creatinine Ratio: 22 (ref 12–28)
BUN: 17 mg/dL (ref 8–27)
Bilirubin Total: 0.3 mg/dL (ref 0.0–1.2)
CO2: 24 mmol/L (ref 20–29)
Calcium: 9.4 mg/dL (ref 8.7–10.3)
Chloride: 102 mmol/L (ref 96–106)
Creatinine, Ser: 0.78 mg/dL (ref 0.57–1.00)
GFR calc Af Amer: 94 mL/min/{1.73_m2} (ref >59)
GFR calc non Af Amer: 82 mL/min/{1.73_m2} (ref >59)
Globulin, Total: 2.6 g/dL (ref 1.5–4.5)
Glucose: 115 mg/dL — ABNORMAL HIGH (ref 65–99)
Potassium: 3.9 mmol/L (ref 3.5–5.2)
Sodium: 140 mmol/L (ref 134–144)
Total Protein: 6.7 g/dL (ref 6.0–8.5)

## 2019-01-08 LAB — HEMOGLOBIN A1C
Est. average glucose Bld gHb Est-mCnc: 126 mg/dL
Hgb A1c MFr Bld: 6 % — ABNORMAL HIGH (ref 4.8–5.6)

## 2019-01-08 LAB — TSH: TSH: 1.45 u[IU]/mL (ref 0.450–4.500)

## 2019-01-12 ENCOUNTER — Telehealth: Payer: Self-pay

## 2019-01-12 ENCOUNTER — Other Ambulatory Visit: Payer: Self-pay | Admitting: Family Medicine

## 2019-01-12 DIAGNOSIS — E876 Hypokalemia: Secondary | ICD-10-CM

## 2019-01-12 NOTE — Telephone Encounter (Signed)
LMTCB 01/12/2019  Thanks,   -Mickel Baas

## 2019-01-12 NOTE — Telephone Encounter (Signed)
-----   Message from Virginia Crews, MD sent at 01/12/2019  3:48 PM EDT ----- Cholesterol is slightly elevated, but 10 year risk of heart disease/stroke is low at 4.2%.  No need for medications to lower your cholesterol at this time.  I do recommend diet low in saturated fat and regular exercise - 30 min at least 5 times per week.  Normal kidney function, liver function, electrolytes, thyroid function.  Hemoglobin A1c, a 55-month average of blood sugars, is in the prediabetic range.  In order to prevent progression to diabetes, recommend low-carb diet and regular exercise.

## 2019-01-13 NOTE — Telephone Encounter (Signed)
Patient advised.

## 2019-01-13 NOTE — Telephone Encounter (Signed)
LMTCB or to view message through my chart.

## 2019-02-08 ENCOUNTER — Other Ambulatory Visit: Payer: Self-pay

## 2019-02-08 ENCOUNTER — Ambulatory Visit (INDEPENDENT_AMBULATORY_CARE_PROVIDER_SITE_OTHER): Payer: BC Managed Care – PPO | Admitting: Family Medicine

## 2019-02-08 ENCOUNTER — Encounter: Payer: Self-pay | Admitting: Family Medicine

## 2019-02-08 VITALS — BP 143/88 | HR 79 | Temp 96.9°F | Resp 18 | Ht 66.0 in | Wt 214.0 lb

## 2019-02-08 DIAGNOSIS — S93492A Sprain of other ligament of left ankle, initial encounter: Secondary | ICD-10-CM

## 2019-02-08 DIAGNOSIS — I10 Essential (primary) hypertension: Secondary | ICD-10-CM

## 2019-02-08 MED ORDER — METOPROLOL SUCCINATE ER 50 MG PO TB24: 50.0000 mg | ORAL_TABLET | Freq: Every day | ORAL | 3 refills | Status: DC

## 2019-02-08 NOTE — Progress Notes (Signed)
Patient: Angela Villarreal Female    DOB: 07-30-1955   63 y.o.   MRN: [id number] Visit Date: 02/08/2019  Today's Provider: [name], MD   Chief Complaint  Patient presents with   Follow-up   Hypertension   Subjective:    I, Bretlyn Ward ,CMA am acting as a scribe for Dr. [name] MD.   HPI   Hypertension, follow-up:  BP Readings from Last 3 Encounters:  02/08/19 (!) 143/88  01/05/19 127/77  07/02/18 140/74    She was last seen for hypertension 1 months ago.  BP at that visit was 127/77.  Management since that visit includes Continue triamterene HCTZ at current dose  Stop amlodipine (lower extremity swelling) Start metoprolol 25 mg daily Could consider changing triamterene to an ACE inhibitor or ARB in the future She reports excellent compliance with treatment. She is not having side effects. Very little lower leg edema. She is exercising walking.  She is not adherent to low salt diet.  But is moderating her salt.  Outside blood pressures are 150/90's- 170-100's She is experiencing none.  Patient denies chest pain, chest pressure/discomfort, claudication, dyspnea, exertional chest pressure/discomfort, fatigue, irregular heart beat, lower extremity edema, near-syncope, orthopnea, palpitations, paroxysmal nocturnal dyspnea, syncope and tachypnea.   Cardiovascular risk factors include hypertension.    Weight trend: stable Wt Readings from Last 3 Encounters:  02/08/19 214 lb (97.1 kg)  01/05/19 210 lb (95.3 kg)  07/02/18 213 lb (96.6 kg)    Patient is also concerned about left ankle pain and swelling.  She states that she jumped off of a trailer about 3 weeks ago and twisted her left ankle medially.  She has no previous injury or surgery to this ankle.  Since that time it has been intermittently painful and swelling.  It was worse after walking on the beach.  It is slightly swollen today.  There is been no bruising or erythema.  She is able to  walk and bear weight.  She has not tried any medications or treatment for this.  She has not been evaluated for this previously.   Allergies  Allergen Reactions   Gentamicin Sulfate      Current Outpatient Medications:    albuterol (PROVENTIL HFA;VENTOLIN HFA) 108 (90 Base) MCG/ACT inhaler, Inhale 1-2 puffs into the lungs every 4 (four) hours as needed for wheezing or shortness of breath., Disp: 3 Inhaler, Rfl: 3   fexofenadine (ALLEGRA) 180 MG tablet, Take 180 mg by mouth daily., Disp: , Rfl:    FLUTICASONE PROPIONATE, NASAL, NA, Place into the nose., Disp: , Rfl:    fluticasone-salmeterol (ADVAIR HFA) 115-21 MCG/ACT inhaler, Inhale 2 puffs into the lungs 2 (two) times daily., Disp: , Rfl:    metoprolol succinate (TOPROL-XL) 25 MG 24 hr tablet, Take 1 tablet (25 mg total) by mouth daily., Disp: 90 tablet, Rfl: 3   montelukast (SINGULAIR) 10 MG tablet, Take 1 tablet (10 mg total) by mouth at bedtime., Disp: 90 tablet, Rfl: 1   omeprazole (PRILOSEC) 40 MG capsule, Take 40 mg by mouth daily., Disp: , Rfl:    potassium chloride SA (KLOR-CON) 20 MEQ tablet, TAKE ONE TABLET TWICE DAILY, Disp: 180 tablet, Rfl: 4   SYNTHROID 125 MCG tablet, TAKE ONE TABLET EVERY DAY, Disp: 90 tablet, Rfl: 2   triamterene-hydrochlorothiazide (MAXZIDE-25) 37.5-25 MG tablet, TAKE 1 TABLET BY MOUTH DAILY, Disp: 90 tablet, Rfl: 2   hyoscyamine (LEVBID) 0.375 MG 12 hr tablet, Take 1 tablet (0.375  mg total) by mouth 2 (two) times daily., Disp: 60 tablet, Rfl: 3   naproxen sodium (ALEVE) 220 MG tablet, Take 220 mg by mouth 2 (two) times daily as needed., Disp: , Rfl:   Review of Systems  Constitutional: Negative.   HENT: Negative.   Respiratory: Negative.   Cardiovascular: Negative.   Gastrointestinal: Negative.   Genitourinary: Negative.   Musculoskeletal: Positive for arthralgias and joint swelling.  Skin: Negative.   Neurological: Negative.   Psychiatric/Behavioral: Negative.     Social History     Tobacco Use   Smoking status: Never Smoker   Smokeless tobacco: Never Used  Substance Use Topics   Alcohol use: Yes    Comment: once monthly      Objective:   BP (!) 143/88    Pulse 79    Temp (!) 96.9 F (36.1 C) (Temporal)    Resp 18    Ht 5\' 6"  (1.676 m)    Wt 214 lb (97.1 kg)    BMI 34.54 kg/m  Vitals:   02/08/19 1103  BP: (!) 143/88  Pulse: 79  Resp: 18  Temp: (!) 96.9 F (36.1 C)  TempSrc: Temporal  Weight: 214 lb (97.1 kg)  Height: 5\' 6"  (1.676 m)  Body mass index is 34.54 kg/m.   Physical Exam Vitals signs reviewed.  Constitutional:      General: She is not in acute distress.    Appearance: Normal appearance. She is well-developed. She is not diaphoretic.  HENT:     Head: Normocephalic and atraumatic.  Eyes:     General: No scleral icterus.    Conjunctiva/sclera: Conjunctivae normal.  Neck:     Musculoskeletal: Neck supple.     Thyroid: No thyromegaly.  Cardiovascular:     Rate and Rhythm: Normal rate and regular rhythm.     Pulses: Normal pulses.     Heart sounds: Normal heart sounds. No murmur.  Pulmonary:     Effort: Pulmonary effort is normal. No respiratory distress.     Breath sounds: Normal breath sounds. No wheezing, rhonchi or rales.  Musculoskeletal:     Right lower leg: No edema.     Left lower leg: No edema.     Comments: L ankle: Range of motion is intact.  Some soft tissue edema noted on left lateral ankle around the lateral malleolus.  Strength and sensation are intact.  There is some ligamental laxity on inversion.  No tenderness palpation over bony landmarks.  Able to bear weight.  Able to walk 4 steps independently  Lymphadenopathy:     Cervical: No cervical adenopathy.  Skin:    General: Skin is warm and dry.     Capillary Refill: Capillary refill takes less than 2 seconds.     Findings: No rash.  Neurological:     Mental Status: She is alert and oriented to person, place, and time. Mental status is at baseline.   Psychiatric:        Mood and Affect: Mood normal.        Behavior: Behavior normal.      No results found for any visits on 02/08/19.     Assessment & Plan   Problem List Items Addressed This Visit      Cardiovascular and Mediastinum   Essential hypertension - Primary    Previously well controlled, but amlodipine was discontinued due to lower extremity edema Uncontrolled today, but asymptomatic Seems to be uncontrolled on home readings as well We discussed home monitoring of blood  pressure, but it seems to be causing her more anxiety which is in turn raising her blood pressure, so she will discontinue this at this time We will continue triamterene HCTZ at current dose and increase metoprolol to 50 mg daily as her pulse can tolerate this Reviewed recent metabolic panel Follow-up in 2 months      Relevant Medications   metoprolol succinate (TOPROL-XL) 50 MG 24 hr tablet    Other Visit Diagnoses    Sprain of anterior talofibular ligament of left ankle, initial encounter        -New problem -Seems to have had a medial inversion sprain injury 2 to 3 weeks ago that continues to be bothersome when she overexerts herself -No signs of any bony injury and no need for imaging at this time -Advised on rest, ice, elevation -May use NSAIDs as needed -Discussed ankle strengthening exercises to prevent future injury -Return precautions discussed   Return in about 2 months (around 04/10/2019) for chronic disease f/u.   The entirety of the information documented in the History of Present Illness, Review of Systems and Physical Exam were personally obtained by me. Portions of this information were initially documented by [facility], CMA and reviewed by me for thoroughness and accuracy.    Nonna Renninger, Dionne Bucy, MD MPH [facility]

## 2019-02-08 NOTE — Assessment & Plan Note (Signed)
Previously well controlled, but amlodipine was discontinued due to lower extremity edema Uncontrolled today, but asymptomatic Seems to be uncontrolled on home readings as well We discussed home monitoring of blood pressure, but it seems to be causing her more anxiety which is in turn raising her blood pressure, so she will discontinue this at this time We will continue triamterene HCTZ at current dose and increase metoprolol to 50 mg daily as her pulse can tolerate this Reviewed recent metabolic panel Follow-up in 2 months

## 2019-03-23 ENCOUNTER — Ambulatory Visit
Admission: RE | Admit: 2019-03-23 | Discharge: 2019-03-23 | Disposition: A | Discharge status: Home / Self Care | Payer: BC Managed Care – PPO | Source: Ambulatory Visit | Attending: Family Medicine | Admitting: Family Medicine

## 2019-03-23 DIAGNOSIS — Z0000 Encounter for general adult medical examination without abnormal findings: Secondary | ICD-10-CM

## 2019-03-23 DIAGNOSIS — Z1231 Encounter for screening mammogram for malignant neoplasm of breast: Secondary | ICD-10-CM | POA: Diagnosis present

## 2019-04-14 ENCOUNTER — Other Ambulatory Visit: Payer: Self-pay

## 2019-04-14 ENCOUNTER — Ambulatory Visit: Payer: BC Managed Care – PPO | Admitting: Family Medicine

## 2019-04-14 ENCOUNTER — Encounter: Payer: Self-pay | Admitting: Family Medicine

## 2019-04-14 VITALS — BP 138/80 | HR 80 | Temp 96.3°F | Wt 208.0 lb

## 2019-04-14 DIAGNOSIS — Z6833 Body mass index (BMI) 33.0-33.9, adult: Secondary | ICD-10-CM | POA: Diagnosis not present

## 2019-04-14 DIAGNOSIS — I10 Essential (primary) hypertension: Secondary | ICD-10-CM | POA: Diagnosis not present

## 2019-04-14 DIAGNOSIS — E669 Obesity, unspecified: Secondary | ICD-10-CM

## 2019-04-14 DIAGNOSIS — K580 Irritable bowel syndrome with diarrhea: Secondary | ICD-10-CM

## 2019-04-14 MED ORDER — METOPROLOL SUCCINATE ER 100 MG PO TB24: 100.0000 mg | ORAL_TABLET | Freq: Every day | ORAL | 3 refills | Status: DC

## 2019-04-14 NOTE — Assessment & Plan Note (Signed)
Encouraged her to continue with her healthier diet She has lost about 6 pounds in the last 2 months Discussed importance of healthy weight management Discussed diet and exercise

## 2019-04-14 NOTE — Progress Notes (Signed)
Patient: Angela Villarreal Female    DOB: 01-23-1956   64 y.o.   MRN: [id number] Visit Date: 04/14/2019  Today's Provider: [name], MD   Chief Complaint  Patient presents with  . Hypertension   Subjective:     HPI    Hypertension, follow-up:  BP Readings from Last 3 Encounters:  04/14/19 138/80  02/08/19 (!) 143/88  01/05/19 127/77    She was last seen for hypertension 2 months ago.  BP at that visit was 143/88. Management since that visit includes Increased Metoprolol to 50mg  a day. She reports excellent compliance with treatment. She is not having side effects.  She is exercising. She is adherent to low salt diet.   Outside blood pressures are not being checked. She is experiencing none.  Patient denies chest pain, fatigue, lower extremity edema, palpitations and syncope.   Cardiovascular risk factors include advanced age (older than 2 for men, 24 for women) and hypertension.  Use of agents associated with hypertension: none.   ------------------------------------------------------------------------   Has had more bloating and gas since increasign MEtoprolol.  She also states that she has changed her diet and is eating more fiber, fresh fruits and veggies.  She has known IBS with intermittent diarrhea.   Allergies  Allergen Reactions  . Gentamicin Sulfate      Current Outpatient Medications:  .  albuterol (PROVENTIL HFA;VENTOLIN HFA) 108 (90 Base) MCG/ACT inhaler, Inhale 1-2 puffs into the lungs every 4 (four) hours as needed for wheezing or shortness of breath., Disp: 3 Inhaler, Rfl: 3 .  fexofenadine (ALLEGRA) 180 MG tablet, Take 180 mg by mouth daily., Disp: , Rfl:  .  FLUTICASONE PROPIONATE, NASAL, NA, Place into the nose., Disp: , Rfl:  .  fluticasone-salmeterol (ADVAIR HFA) 115-21 MCG/ACT inhaler, Inhale 2 puffs into the lungs 2 (two) times daily., Disp: , Rfl:  .  hyoscyamine (LEVBID) 0.375 MG 12 hr tablet, Take 1 tablet (0.375 mg total) by  mouth 2 (two) times daily., Disp: 60 tablet, Rfl: 3 .  metoprolol succinate (TOPROL-XL) 100 MG 24 hr tablet, Take 1 tablet (100 mg total) by mouth daily., Disp: 30 tablet, Rfl: 3 .  montelukast (SINGULAIR) 10 MG tablet, Take 1 tablet (10 mg total) by mouth at bedtime., Disp: 90 tablet, Rfl: 1 .  naproxen sodium (ALEVE) 220 MG tablet, Take 220 mg by mouth 2 (two) times daily as needed., Disp: , Rfl:  .  omeprazole (PRILOSEC) 40 MG capsule, Take 40 mg by mouth daily., Disp: , Rfl:  .  potassium chloride SA (KLOR-CON) 20 MEQ tablet, TAKE ONE TABLET TWICE DAILY, Disp: 180 tablet, Rfl: 4 .  SYNTHROID 125 MCG tablet, TAKE ONE TABLET EVERY DAY, Disp: 90 tablet, Rfl: 2 .  triamterene-hydrochlorothiazide (MAXZIDE-25) 37.5-25 MG tablet, TAKE 1 TABLET BY MOUTH DAILY, Disp: 90 tablet, Rfl: 2  Review of Systems  Constitutional: Negative.   Respiratory: Negative.   Cardiovascular: Negative.   Gastrointestinal: Negative.   Neurological: Negative for dizziness, light-headedness and headaches.    Social History   Tobacco Use  . Smoking status: Never Smoker  . Smokeless tobacco: Never Used  Substance Use Topics  . Alcohol use: Yes    Comment: once monthly      Objective:   BP 138/80   Pulse 80   Temp (!) 96.3 F (35.7 C) (Temporal)   Wt 208 lb (94.3 kg)   SpO2 97%   BMI 33.57 kg/m  Vitals:   04/14/19 [id number] 04/14/19 0857  BP: (!) 142/80 138/80  Pulse: 80   Temp: (!) 96.3 F (35.7 C)   TempSrc: Temporal   SpO2: 97%   Weight: 208 lb (94.3 kg)   Body mass index is 33.57 kg/m.   Physical Exam Vitals reviewed.  Constitutional:      General: She is not in acute distress.    Appearance: Normal appearance. She is well-developed. She is not diaphoretic.  HENT:     Head: Normocephalic and atraumatic.  Eyes:     General: No scleral icterus.    Conjunctiva/sclera: Conjunctivae normal.  Neck:     Thyroid: No thyromegaly.  Cardiovascular:     Rate and Rhythm: Normal rate and regular  rhythm.     Pulses: Normal pulses.     Heart sounds: Normal heart sounds. No murmur.  Pulmonary:     Effort: Pulmonary effort is normal. No respiratory distress.     Breath sounds: Normal breath sounds. No wheezing, rhonchi or rales.  Abdominal:     General: There is no distension.     Palpations: Abdomen is soft.     Tenderness: There is no abdominal tenderness.  Musculoskeletal:     Cervical back: Neck supple.     Right lower leg: No edema.     Left lower leg: No edema.  Lymphadenopathy:     Cervical: No cervical adenopathy.  Skin:    General: Skin is warm and dry.     Capillary Refill: Capillary refill takes less than 2 seconds.     Findings: No rash.  Neurological:     Mental Status: She is alert and oriented to person, place, and time. Mental status is at baseline.  Psychiatric:        Mood and Affect: Mood normal.        Behavior: Behavior normal.      No results found for any visits on 04/14/19.     Assessment & Plan    Problem List Items Addressed This Visit      Cardiovascular and Mediastinum   Essential hypertension - Primary    Initially elevated, but slightly improved on manual recheck Discussed lower goal of around 120/80 if possible As checking her home blood pressures seem to cause her more anxiety, she will continue to hold on home blood pressure monitoring She is currently asymptomatic Previously did not tolerate amlodipine due to lower extremity edema Continue Maxide at current dose Increase metoprolol to 100 mg daily as her pulse can tolerate this Reviewed recent metabolic panel Follow-up in 3 months or sooner as needed      Relevant Medications   metoprolol succinate (TOPROL-XL) 100 MG 24 hr tablet     Digestive   IBS (irritable bowel syndrome)    Discussed low FODMAP diet Doubt metoprolol is contributing to this        Other   Obesity    Encouraged her to continue with her healthier diet She has lost about 6 pounds in the last 2  months Discussed importance of healthy weight management Discussed diet and exercise          Return in about 3 months (around 07/13/2019) for chronic disease f/u.   The entirety of the information documented in the History of Present Illness, Review of Systems and Physical Exam were personally obtained by me. Portions of this information were initially documented by Ashley Royalty, CMA and reviewed by me for thoroughness and accuracy.    Svetlana Bagby, Dionne Bucy, MD MPH [facility]

## 2019-04-14 NOTE — Assessment & Plan Note (Signed)
Discussed low FODMAP diet Doubt metoprolol is contributing to this

## 2019-04-14 NOTE — Assessment & Plan Note (Signed)
Initially elevated, but slightly improved on manual recheck Discussed lower goal of around 120/80 if possible As checking her home blood pressures seem to cause her more anxiety, she will continue to hold on home blood pressure monitoring She is currently asymptomatic Previously did not tolerate amlodipine due to lower extremity edema Continue Maxide at current dose Increase metoprolol to 100 mg daily as her pulse can tolerate this Reviewed recent metabolic panel Follow-up in 3 months or sooner as needed

## 2019-04-14 NOTE — Patient Instructions (Signed)
Low-FODMAP Eating Plan  FODMAPs (fermentable oligosaccharides, disaccharides, monosaccharides, and polyols) are sugars that are hard for some people to digest. A low-FODMAP eating plan may help some people who have bowel (intestinal) diseases to manage their symptoms. This meal plan can be complicated to follow. Work with a diet and nutrition specialist (dietitian) to make a low-FODMAP eating plan that is right for you. A dietitian can make sure that you get enough nutrition from this diet. What are tips for following this plan? Reading food labels  Check labels for hidden FODMAPs such as: ? High-fructose syrup. ? Honey. ? Agave. ? Natural fruit flavors. ? Onion or garlic powder.  Choose low-FODMAP foods that contain 3-4 grams of fiber per serving.  Check food labels for serving sizes. Eat only one serving at a time to make sure FODMAP levels stay low. Meal planning  Follow a low-FODMAP eating plan for up to 6 weeks, or as told by your health care provider or dietitian.  To follow the eating plan: 1. Eliminate high-FODMAP foods from your diet completely. 2. Gradually reintroduce high-FODMAP foods into your diet one at a time. Most people should wait a few days after introducing one high-FODMAP food before they introduce the next high-FODMAP food. Your dietitian can recommend how quickly you may reintroduce foods. 3. Keep a daily record of what you eat and drink, and make note of any symptoms that you have after eating. 4. Review your daily record with a dietitian regularly. Your dietitian can help you identify which foods you can eat and which foods you should avoid. General tips  Drink enough fluid each day to keep your urine pale yellow.  Avoid processed foods. These often have added sugar and may be high in FODMAPs.  Avoid most dairy products, whole grains, and sweeteners.  Work with a dietitian to make sure you get enough fiber in your diet. Recommended  foods Grains  Gluten-free grains, such as rice, oats, buckwheat, quinoa, corn, polenta, and millet. Gluten-free pasta, bread, or cereal. Rice noodles. Corn tortillas. Vegetables  Eggplant, zucchini, cucumber, peppers, green beans, Brussels sprouts, bean sprouts, lettuce, arugula, kale, Swiss chard, spinach, collard greens, bok choy, summer squash, potato, and tomato. Limited amounts of corn, carrot, and sweet potato. Green parts of scallions. Fruits  Bananas, oranges, lemons, limes, blueberries, raspberries, strawberries, grapes, cantaloupe, honeydew melon, kiwi, papaya, passion fruit, and pineapple. Limited amounts of dried cranberries, banana chips, and shredded coconut. Dairy  Lactose-free milk, yogurt, and kefir. Lactose-free cottage cheese and ice cream. Non-dairy milks, such as almond, coconut, hemp, and rice milk. Yogurts made of non-dairy milks. Limited amounts of goat cheese, brie, mozzarella, parmesan, swiss, and other hard cheeses. Meats and other protein foods  Unseasoned beef, pork, poultry, or fish. Eggs. Bacon. Tofu (firm) and tempeh. Limited amounts of nuts and seeds, such as almonds, walnuts, brazil nuts, pecans, peanuts, pumpkin seeds, chia seeds, and sunflower seeds. Fats and oils  Butter-free spreads. Vegetable oils, such as olive, canola, and sunflower oil. Seasoning and other foods  Artificial sweeteners with names that do not end in "ol" such as aspartame, saccharine, and stevia. Maple syrup, white table sugar, raw sugar, brown sugar, and molasses. Fresh basil, coriander, parsley, rosemary, and thyme. Beverages  Water and mineral water. Sugar-sweetened soft drinks. Small amounts of orange juice or cranberry juice. Black and green tea. Most dry wines. Coffee. This may not be a complete list of low-FODMAP foods. Talk with your dietitian for more information. Foods to avoid Grains  Wheat,   including kamut, durum, and semolina. Barley and bulgur. Couscous. Wheat-based  cereals. Wheat noodles, bread, crackers, and pastries. Vegetables  Chicory root, artichoke, asparagus, cabbage, snow peas, sugar snap peas, mushrooms, and cauliflower. Onions, garlic, leeks, and the white part of scallions. Fruits  Fresh, dried, and juiced forms of apple, pear, watermelon, peach, plum, cherries, apricots, blackberries, boysenberries, figs, nectarines, and mango. Avocado. Dairy  Milk, yogurt, ice cream, and soft cheese. Cream and sour cream. Milk-based sauces. Custard. Meats and other protein foods  Fried or fatty meat. Sausage. Cashews and pistachios. Soybeans, baked beans, black beans, chickpeas, kidney beans, fava beans, navy beans, lentils, and split peas. Seasoning and other foods  Any sugar-free gum or candy. Foods that contain artificial sweeteners such as sorbitol, mannitol, isomalt, or xylitol. Foods that contain honey, high-fructose corn syrup, or agave. Bouillon, vegetable stock, beef stock, and chicken stock. Garlic and onion powder. Condiments made with onion, such as hummus, chutney, pickles, relish, salad dressing, and salsa. Tomato paste. Beverages  Chicory-based drinks. Coffee substitutes. Chamomile tea. Fennel tea. Sweet or fortified wines such as port or sherry. Diet soft drinks made with isomalt, mannitol, maltitol, sorbitol, or xylitol. Apple, pear, and mango juice. Juices with high-fructose corn syrup. This may not be a complete list of high-FODMAP foods. Talk with your dietitian to discuss what dietary choices are best for you.  Summary  A low-FODMAP eating plan is a short-term diet that eliminates FODMAPs from your diet to help ease symptoms of certain bowel diseases.  The eating plan usually lasts up to 6 weeks. After that, high-FODMAP foods are restarted gradually, one at a time, so you can find out which may be causing symptoms.  A low-FODMAP eating plan can be complicated. It is best to work with a dietitian who has experience with this type of  plan. This information is not intended to replace advice given to you by your health care provider. Make sure you discuss any questions you have with your health care provider. Document Revised: 02/13/2017 Document Reviewed: 10/28/2016 Elsevier Patient Education  2020 Elsevier Inc.  

## 2019-05-22 ENCOUNTER — Ambulatory Visit: Payer: BC Managed Care – PPO | Attending: Internal Medicine

## 2019-05-22 DIAGNOSIS — Z23 Encounter for immunization: Secondary | ICD-10-CM | POA: Insufficient documentation

## 2019-05-22 NOTE — Progress Notes (Signed)
   Covid-19 Vaccination Clinic  Name:  Angela Villarreal    MRN: [id number] DOB: [birth year]  05/22/2019  [name] was observed post Covid-19 immunization for 15 minutes without incident. She was provided with Vaccine Information Sheet and instruction to access the V-Safe system.   [name] was instructed to call 911 with any severe reactions post vaccine: Marland Kitchen Difficulty breathing  . Swelling of face and throat  . A fast heartbeat  . A bad rash all over body  . Dizziness and weakness   Immunizations Administered    Name Date Dose VIS Date Route   Pfizer COVID-19 Vaccine 05/22/2019 12:41 PM 0.3 mL 02/25/2019 Intramuscular   Manufacturer: [place]   Lot: [id number]   [place]: [id number]

## 2019-05-29 ENCOUNTER — Other Ambulatory Visit: Payer: Self-pay | Admitting: Family Medicine

## 2019-05-29 DIAGNOSIS — E039 Hypothyroidism, unspecified: Secondary | ICD-10-CM

## 2019-05-29 NOTE — Telephone Encounter (Signed)
Requested Prescriptions  Pending Prescriptions Disp Refills  . SYNTHROID 125 MCG tablet [Pharmacy Med Name: SYNTHROID 125 MCG TAB] 90 tablet 2    Sig: TAKE 1 TABLET BY MOUTH DAILY     Endocrinology:  Hypothyroid Agents Failed - 05/29/2019  2:39 PM      Failed - TSH needs to be rechecked within 3 months after an abnormal result. Refill until TSH is due.      Passed - TSH in normal range and within 360 days    TSH  Date Value Ref Range Status  01/07/2019 1.450 0.450 - 4.500 uIU/mL Final         Passed - Valid encounter within last 12 months    Recent Outpatient Visits          1 month ago Essential hypertension   [place], Dionne Bucy, MD   3 months ago Essential hypertension   [place], Dionne Bucy, MD   4 months ago Encounter for annual physical exam   [facility], Dionne Bucy, MD   9 months ago [place] Bacigalupo, Dionne Bucy, MD   11 months ago Essential hypertension   [place], Kirstie Peri, MD      Future Appointments            In 1 month Bacigalupo, Dionne Bucy, MD [facility] [place]

## 2019-06-12 ENCOUNTER — Ambulatory Visit: Payer: BC Managed Care – PPO | Attending: Internal Medicine

## 2019-06-12 DIAGNOSIS — Z23 Encounter for immunization: Secondary | ICD-10-CM

## 2019-06-12 NOTE — Progress Notes (Signed)
   Covid-19 Vaccination Clinic  Name:  Angela Villarreal    MRN: [id number] DOB: 01-14-1956  06/12/2019  [name] was observed post Covid-19 immunization for 15 minutes without incident. She was provided with Vaccine Information Sheet and instruction to access the V-Safe system.   [name] was instructed to call 911 with any severe reactions post vaccine: Marland Kitchen Difficulty breathing  . Swelling of face and throat  . A fast heartbeat  . A bad rash all over body  . Dizziness and weakness   Immunizations Administered    Name Date Dose VIS Date Route   Pfizer COVID-19 Vaccine 06/12/2019 11:29 AM 0.3 mL 02/25/2019 Intramuscular   Manufacturer: [place]   Lot: [id number]   [place]: [id number]

## 2019-06-13 ENCOUNTER — Other Ambulatory Visit: Payer: Self-pay | Admitting: Family Medicine

## 2019-06-13 NOTE — Telephone Encounter (Signed)
Requested Prescriptions  Pending Prescriptions Disp Refills  . metoprolol succinate (TOPROL-XL) 100 MG 24 hr tablet [Pharmacy Med Name: METOPROLOL SUCCINATE ER 100 MG TAB] 30 tablet 3    Sig: TAKE 1 TABLET BY MOUTH DAILY     Cardiovascular:  Beta Blockers Passed - 06/13/2019  7:39 AM      Passed - Last BP in normal range    BP Readings from Last 1 Encounters:  04/14/19 138/80         Passed - Last Heart Rate in normal range    Pulse Readings from Last 1 Encounters:  04/14/19 80         Passed - Valid encounter within last 6 months    Recent Outpatient Visits          2 months ago Essential hypertension   [facility] [place], Dionne Bucy, MD   4 months ago Essential hypertension   [place], Dionne Bucy, MD   5 months ago Encounter for annual physical exam   [facility], Dionne Bucy, MD   10 months ago [place], Dionne Bucy, MD   11 months ago Essential hypertension   [place], Kirstie Peri, MD      Future Appointments            In 1 month [place], Dionne Bucy, MD [facility], [place]

## 2019-07-14 ENCOUNTER — Ambulatory Visit: Payer: BC Managed Care – PPO | Admitting: Family Medicine

## 2019-07-14 ENCOUNTER — Other Ambulatory Visit: Payer: Self-pay

## 2019-07-14 ENCOUNTER — Encounter: Payer: Self-pay | Admitting: Family Medicine

## 2019-07-14 VITALS — BP 138/75 | HR 69 | Temp 96.7°F | Wt 214.0 lb

## 2019-07-14 DIAGNOSIS — K580 Irritable bowel syndrome with diarrhea: Secondary | ICD-10-CM

## 2019-07-14 DIAGNOSIS — E039 Hypothyroidism, unspecified: Secondary | ICD-10-CM

## 2019-07-14 DIAGNOSIS — Z6834 Body mass index (BMI) 34.0-34.9, adult: Secondary | ICD-10-CM

## 2019-07-14 DIAGNOSIS — E669 Obesity, unspecified: Secondary | ICD-10-CM

## 2019-07-14 DIAGNOSIS — I10 Essential (primary) hypertension: Secondary | ICD-10-CM

## 2019-07-14 DIAGNOSIS — S93492A Sprain of other ligament of left ankle, initial encounter: Secondary | ICD-10-CM | POA: Insufficient documentation

## 2019-07-14 DIAGNOSIS — R7303 Prediabetes: Secondary | ICD-10-CM | POA: Insufficient documentation

## 2019-07-14 NOTE — Assessment & Plan Note (Addendum)
Recent flare after walking on an uneven surface.  Discuss using an ankle brace when walking an uneven surface.  Use Naproxen as needed.  Pt will also work on exercises provided today.  Also discussed ice, rest, elevation offered formal PT, but patient declined

## 2019-07-14 NOTE — Assessment & Plan Note (Addendum)
Could be stress related.  Pt is going to be retiring soon, and has a lot of stuff going on right will.  Discussed low FODMAP diet.  Will call if not improved or worsening will consider medications at that time.

## 2019-07-14 NOTE — Assessment & Plan Note (Signed)
Encourage low-carb diet recheck A1c

## 2019-07-14 NOTE — Patient Instructions (Signed)

## 2019-07-14 NOTE — Progress Notes (Signed)
I,Laura E Walsh,acting as a scribe for [name], MD.,have documented all relevant documentation on the behalf of [name], MD,as directed by  [name], MD while in the presence of [name], MD.   Established patient visit   Patient: Angela Villarreal   DOB: [birth year]   64 y.o. Female  MRN: [id number] Visit Date: 07/14/2019  Today's healthcare provider: [name], MD   Chief Complaint  Patient presents with  . Hypertension    3 month follow up  . Ankle Pain    left ankle    Subjective    Ankle Pain  Injury mechanism: [place] uneven [place] the beach. The pain is present in the left ankle and left knee. Associated symptoms include muscle weakness. Pertinent negatives include no inability to bear weight, loss of motion, loss of sensation, numbness or tingling. The symptoms are aggravated by palpation, weight bearing and movement.    Hypertension, follow-up  BP Readings from Last 3 Encounters:  07/14/19 138/75  04/14/19 138/80  02/08/19 (!) 143/88   Wt Readings from Last 3 Encounters:  07/14/19 214 lb (97.1 kg)  04/14/19 208 lb (94.3 kg)  02/08/19 214 lb (97.1 kg)     She was last seen for hypertension 3 months ago.  BP at that visit was 142/80. Management since that visit includes increased metoprolol to 100mg  a day.   She reports excellent compliance with treatment. She is not having side effects. She is following a Low Sodium diet. She is exercising. She does not smoke.  Use of agents associated with hypertension: none.   Outside blood pressures are not being checked at home. Symptoms: No chest pain No chest pressure No palpitations No dyspnea No orthopnea No paroxysmal nocturnal dyspnea No lower extremity edema No syncope   Pertinent labs: Lab Results  Component Value Date   CHOL 193 01/07/2019   HDL 42 01/07/2019   LDLCALC 129 (H) 01/07/2019   TRIG 120 01/07/2019   CHOLHDL 4.6 (H) 01/07/2019   Lab  Results  Component Value Date   NA 140 01/07/2019   K 3.9 01/07/2019   CO2 24 01/07/2019   GLUCOSE 115 (H) 01/07/2019   BUN 17 01/07/2019   CREATININE 0.78 01/07/2019   CALCIUM 9.4 01/07/2019   GFRNONAA 82 01/07/2019   GFRAA 94 01/07/2019     The 10-year ASCVD risk score Mikey Bussing DC Jr., et al., 2013) is: 7.9%   ---------------------------------------------------------------------------------------------------   Patient Active Problem List   Diagnosis Date Noted  . Sprain of anterior talofibular ligament of left ankle 07/14/2019  . Prediabetes 07/14/2019  . Venous insufficiency of both lower extremities 01/05/2019  . History of adenomatous polyp of colon 12/01/2017  . Varicose veins of bilateral lower extremities with pain 09/04/2016  . History of malignant melanoma 09/04/2016  . IBS (irritable bowel syndrome) 09/04/2015  . Post-menopausal 11/22/2014  . Allergic rhinitis 08/17/2014  . Airway hyperreactivity 08/17/2014  . Family history of cardiomyopathy 08/17/2014  . Essential hypertension 08/17/2014  . Adult hypothyroidism 08/17/2014  . Obesity 08/17/2014  . Arthritis, degenerative 08/17/2014  . Cardiac murmur 07/18/2014  . Hyperlipemia, mixed 07/17/2014   Social History   Tobacco Use  . Smoking status: Never Smoker  . Smokeless tobacco: Never Used  Substance Use Topics  . Alcohol use: Yes    Comment: once monthly  . Drug use: No   Allergies  Allergen Reactions  . Gentamicin Sulfate        Medications: Outpatient Medications  Prior to Visit  Medication Sig  . albuterol (PROVENTIL HFA;VENTOLIN HFA) 108 (90 Base) MCG/ACT inhaler Inhale 1-2 puffs into the lungs every 4 (four) hours as needed for wheezing or shortness of breath.  . fexofenadine (ALLEGRA) 180 MG tablet Take 180 mg by mouth daily.  Marland Kitchen FLUTICASONE PROPIONATE, NASAL, NA Place into the nose.  . fluticasone-salmeterol (ADVAIR HFA) 115-21 MCG/ACT inhaler Inhale 2 puffs into the lungs 2 (two) times daily.   . hyoscyamine (LEVBID) 0.375 MG 12 hr tablet Take 1 tablet (0.375 mg total) by mouth 2 (two) times daily.  . metoprolol succinate (TOPROL-XL) 100 MG 24 hr tablet TAKE 1 TABLET BY MOUTH DAILY  . montelukast (SINGULAIR) 10 MG tablet Take 1 tablet (10 mg total) by mouth at bedtime.  . naproxen sodium (ALEVE) 220 MG tablet Take 220 mg by mouth 2 (two) times daily as needed.  Marland Kitchen omeprazole (PRILOSEC) 40 MG capsule Take 40 mg by mouth daily.  . potassium chloride SA (KLOR-CON) 20 MEQ tablet TAKE ONE TABLET TWICE DAILY  . SYNTHROID 125 MCG tablet TAKE 1 TABLET BY MOUTH DAILY  . triamterene-hydrochlorothiazide (MAXZIDE-25) 37.5-25 MG tablet TAKE 1 TABLET BY MOUTH DAILY   No facility-administered medications prior to visit.    Review of Systems  Constitutional: Negative.   Respiratory: Negative.   Cardiovascular: Negative.   Gastrointestinal: Positive for diarrhea. Negative for abdominal distention, abdominal pain, anal bleeding, blood in stool, constipation, nausea, rectal pain and vomiting.       History of IBS  Musculoskeletal: Positive for arthralgias and joint swelling. Negative for back pain, gait problem, myalgias, neck pain and neck stiffness.  Neurological: Positive for light-headedness (Slightly when going from bending to standing. ). Negative for dizziness, tingling, numbness and headaches.    Last CBC Lab Results  Component Value Date   WBC 7.1 09/05/2015   HGB 12.5 09/05/2015   HCT 36.4 09/05/2015   MCV 85 09/05/2015   MCH 29.2 09/05/2015   RDW 13.6 09/05/2015   PLT 241 [id number]   Last metabolic panel Lab Results  Component Value Date   GLUCOSE 115 (H) 01/07/2019   NA 140 01/07/2019   K 3.9 01/07/2019   CL 102 01/07/2019   CO2 24 01/07/2019   BUN 17 01/07/2019   CREATININE 0.78 01/07/2019   GFRNONAA 82 01/07/2019   GFRAA 94 01/07/2019   CALCIUM 9.4 01/07/2019   PROT 6.7 01/07/2019   ALBUMIN 4.1 01/07/2019   LABGLOB 2.6 01/07/2019   AGRATIO 1.6 01/07/2019    BILITOT 0.3 01/07/2019   ALKPHOS 90 01/07/2019   AST 18 01/07/2019   ALT 21 01/07/2019   Last lipids Lab Results  Component Value Date   CHOL 193 01/07/2019   HDL 42 01/07/2019   LDLCALC 129 (H) 01/07/2019   TRIG 120 01/07/2019   CHOLHDL 4.6 (H) 01/07/2019   Last hemoglobin A1c Lab Results  Component Value Date   HGBA1C 6.0 (H) 01/07/2019   Last thyroid functions Lab Results  Component Value Date   TSH 1.450 01/07/2019   T4TOTAL 8.4 09/04/2016   Last vitamin D No results found for: 25OHVITD2, 25OHVITD3, VD25OH Last vitamin B12 and Folate No results found for: VITAMINB12, FOLATE    Objective    BP 138/75 (BP Location: Right Arm, Patient Position: Sitting, Cuff Size: Large)   Pulse 69   Temp (!) 96.7 F (35.9 C) (Temporal)   Wt 214 lb (97.1 kg)   SpO2 97%   BMI 34.54 kg/m  BP Readings from Last 3  Encounters:  07/14/19 138/75  04/14/19 138/80  02/08/19 (!) 143/88      Physical Exam Vitals and nursing note reviewed.  Constitutional:      General: She is not in acute distress.    Appearance: Normal appearance. She is not diaphoretic.  HENT:     Head: Normocephalic and atraumatic.  Eyes:     Conjunctiva/sclera: Conjunctivae normal.  Cardiovascular:     Rate and Rhythm: Normal rate and regular rhythm.     Pulses: Normal pulses.     Heart sounds: Normal heart sounds. No murmur.  Pulmonary:     Effort: Pulmonary effort is normal. No respiratory distress.     Breath sounds: Normal breath sounds. No wheezing.  Musculoskeletal:     Cervical back: Neck supple.     Left knee: No swelling, deformity, effusion or erythema. No tenderness. No LCL laxity, MCL laxity, ACL laxity or PCL laxity.    Right lower leg: No edema.     Left lower leg: No edema.     Left ankle: Swelling present. No deformity or ecchymosis. No tenderness. Normal range of motion.     Comments: Laxity of lateral ligaments of L ankle  Lymphadenopathy:     Cervical: No cervical adenopathy.   Skin:    General: Skin is warm and dry.     Findings: No rash.  Neurological:     Mental Status: She is alert and oriented to person, place, and time. Mental status is at baseline.  Psychiatric:        Mood and Affect: Mood normal.        Behavior: Behavior normal.        Thought Content: Thought content normal.        Judgment: Judgment normal.      No results found for any visits on 07/14/19.  Assessment & Plan     Problem List Items Addressed This Visit      Cardiovascular and Mediastinum   Essential hypertension - Primary    Pt tolerating the increase of Metoprolol well.  Slightly elevated at the beginning of the office visit.  Improved to 138/75 towards the end of visit.  No changes in medications right now.  Recheck metabolic panel follow-up in 6 months      Relevant Orders   Basic Metabolic Panel (BMET)     Digestive   IBS (irritable bowel syndrome)    Could be stress related.  Pt is going to be retiring soon, and has a lot of stuff going on right will.  Discussed low FODMAP diet.  Will call if not improved or worsening will consider medications at that time.         Endocrine   Adult hypothyroidism    Well controlled Recheck TSH today.   Will adjust Synthroid as needed.       Relevant Orders   TSH     Musculoskeletal and Integument   Sprain of anterior talofibular ligament of left ankle    Recent flare after walking on an uneven surface.  Discuss using an ankle brace when walking an uneven surface.  Use Naproxen as needed.  Pt will also work on exercises provided today.  Also discussed ice, rest, elevation offered formal PT, but patient declined        Other   Obesity    Discussed importance of healthy weight management Discussed diet and exercise       Prediabetes    Encourage low-carb diet recheck A1c  Relevant Orders   Hemoglobin [id number]   Basic Metabolic Panel (BMET)       Return in about 6 months (around 01/13/2020).       I, [name], MD, have reviewed all documentation for this visit. The documentation on 07/14/19 for the exam, diagnosis, procedures, and orders are all accurate and complete.   See [place]har[place], Dionne Bucy, MD, MPH [facility]

## 2019-07-14 NOTE — Assessment & Plan Note (Addendum)
Pt tolerating the increase of Metoprolol well.  Slightly elevated at the beginning of the office visit.  Improved to 138/75 towards the end of visit.  No changes in medications right now.  Recheck metabolic panel follow-up in 6 months

## 2019-07-14 NOTE — Assessment & Plan Note (Signed)
Well controlled Recheck TSH today.   Will adjust Synthroid as needed.

## 2019-07-14 NOTE — Assessment & Plan Note (Signed)
Discussed importance of healthy weight management Discussed diet and exercise  

## 2019-07-15 ENCOUNTER — Telehealth: Payer: Self-pay

## 2019-07-15 LAB — TSH: TSH: 0.578 u[IU]/mL (ref 0.450–4.500)

## 2019-07-15 LAB — BASIC METABOLIC PANEL
BUN/Creatinine Ratio: 16 (ref 12–28)
BUN: 15 mg/dL (ref 8–27)
CO2: 23 mmol/L (ref 20–29)
Calcium: 9.4 mg/dL (ref 8.7–10.3)
Chloride: 100 mmol/L (ref 96–106)
Creatinine, Ser: 0.95 mg/dL (ref 0.57–1.00)
GFR calc Af Amer: 74 mL/min/{1.73_m2} (ref >59)
GFR calc non Af Amer: 64 mL/min/{1.73_m2} (ref >59)
Glucose: 105 mg/dL — ABNORMAL HIGH (ref 65–99)
Potassium: 3.8 mmol/L (ref 3.5–5.2)
Sodium: 139 mmol/L (ref 134–144)

## 2019-07-15 LAB — HEMOGLOBIN A1C
Est. average glucose Bld gHb Est-mCnc: 134 mg/dL
Hgb A1c MFr Bld: 6.3 % — ABNORMAL HIGH (ref 4.8–5.6)

## 2019-07-15 NOTE — Telephone Encounter (Signed)
-----   Message from Virginia Crews, MD sent at 07/15/2019  9:27 AM EDT ----- Normal labs, except A1c has worsened.  Still in prediabetic range, but recommend diet and exercise to slow progression

## 2019-07-15 NOTE — Telephone Encounter (Signed)
Patient notified of lab results and PCP recommendations °

## 2019-07-15 NOTE — Telephone Encounter (Signed)
LMTCB 07/15/2019.  PEC Please advise pt of lab results below.   Thanks,   -Mickel Baas

## 2019-07-27 ENCOUNTER — Other Ambulatory Visit: Payer: Self-pay | Admitting: Family Medicine

## 2019-07-27 DIAGNOSIS — K589 Irritable bowel syndrome without diarrhea: Secondary | ICD-10-CM

## 2019-09-07 ENCOUNTER — Other Ambulatory Visit: Payer: Self-pay

## 2019-09-07 ENCOUNTER — Ambulatory Visit: Payer: BC Managed Care – PPO | Admitting: Dermatology

## 2019-09-07 ENCOUNTER — Encounter: Payer: Self-pay | Admitting: Dermatology

## 2019-09-07 DIAGNOSIS — L814 Other melanin hyperpigmentation: Secondary | ICD-10-CM | POA: Diagnosis not present

## 2019-09-07 DIAGNOSIS — L821 Other seborrheic keratosis: Secondary | ICD-10-CM | POA: Diagnosis not present

## 2019-09-07 DIAGNOSIS — Z1283 Encounter for screening for malignant neoplasm of skin: Secondary | ICD-10-CM

## 2019-09-07 DIAGNOSIS — D229 Melanocytic nevi, unspecified: Secondary | ICD-10-CM

## 2019-09-07 DIAGNOSIS — L570 Actinic keratosis: Secondary | ICD-10-CM

## 2019-09-07 DIAGNOSIS — L820 Inflamed seborrheic keratosis: Secondary | ICD-10-CM | POA: Diagnosis not present

## 2019-09-07 DIAGNOSIS — D692 Other nonthrombocytopenic purpura: Secondary | ICD-10-CM

## 2019-09-07 DIAGNOSIS — Z85820 Personal history of malignant melanoma of skin: Secondary | ICD-10-CM | POA: Diagnosis not present

## 2019-09-07 DIAGNOSIS — D1800 Hemangioma unspecified site: Secondary | ICD-10-CM

## 2019-09-07 DIAGNOSIS — I8310 Varicose veins of unspecified lower extremity with inflammation: Secondary | ICD-10-CM

## 2019-09-07 DIAGNOSIS — L578 Other skin changes due to chronic exposure to nonionizing radiation: Secondary | ICD-10-CM

## 2019-09-07 NOTE — Progress Notes (Addendum)
Follow-Up Visit   Subjective  Angela Villarreal is a 64 y.o. female who presents for the following: Annual Exam (1 year f/u [facility] Hx of Melanoma L cheek 2005 ). The patient presents for Total-Body Skin Exam ([facility]) for skin cancer screening and mole check.  The following portions of the chart were reviewed this encounter and updated as appropriate:  Tobacco  Allergies  Meds  Problems  Med Hx  Surg Hx  Fam Hx      Review of Systems:  No other skin or systemic complaints except as noted in HPI or Assessment and Plan.  Objective  Well appearing patient in no apparent distress; mood and affect are within normal limits.  A full examination was performed including scalp, head, eyes, ears, nose, lips, neck, chest, axillae, abdomen, back, buttocks, bilateral upper extremities, bilateral lower extremities, hands, feet, fingers, toes, fingernails, and toenails. All findings within normal limits unless otherwise noted below.  Objective  L infraorbitial: Erythematous keratotic or waxy stuck-on papule or plaque.   Objective  L cheek: Well healed scar with no evidence of recurrence, no lymphadenopathy.    Assessment & Plan    Lentigines - Scattered tan macules - Discussed due to sun exposure - Benign, observe - Call for any changes  Seborrheic Keratoses - Stuck-on, waxy, tan-brown papules and plaques  - Discussed benign etiology and prognosis. - Observe - Call for any changes  Melanocytic Nevi - Tan-brown and/or pink-flesh-colored symmetric macules and papules - Benign appearing on exam today - Observation - Call clinic for new or changing moles - Recommend daily use of broad spectrum spf 30+ sunscreen to sun-exposed areas.   Hemangiomas - Red papules - Discussed benign nature - Observe - Call for any changes  Actinic Damage - diffuse scaly erythematous macules with underlying dyspigmentation - Recommend daily broad spectrum sunscreen SPF 30+ to sun-exposed areas,  reapply every 2 hours as needed.  - Call for new or changing lesions.  Varicose Veins - Dilated blue, purple or red veins at the lower extremities - Reassured - These can be treated by sclerotherapy (a procedure to inject a medicine into the veins to make them disappear) if desired, but the treatment is not covered by insurance  Purpura - Violaceous macules and patches - Benign - Related to age, sun damage and/or use of blood thinners - Observe - Can use OTC arnica containing moisturizer such as Dermend Bruise Formula if desired - Call for worsening or other concerns  Skin cancer screening performed today.  AK (actinic keratosis) R nose supratip  Destruction of lesion - R nose supratip Complexity: simple   Destruction method: cryotherapy   Informed consent: discussed and consent obtained   Timeout:  patient name, date of birth, surgical site, and procedure verified Lesion destroyed using liquid nitrogen: Yes   Region frozen until ice ball extended beyond lesion: Yes   Outcome: patient tolerated procedure well with no complications   Post-procedure details: wound care instructions given    Inflamed seborrheic keratosis L infraorbitial  Destruction of lesion - L infraorbitial Complexity: simple   Destruction method: cryotherapy   Informed consent: discussed and consent obtained   Timeout:  patient name, date of birth, surgical site, and procedure verified Lesion destroyed using liquid nitrogen: Yes   Region frozen until ice ball extended beyond lesion: Yes   Outcome: patient tolerated procedure well with no complications   Post-procedure details: wound care instructions given    History of melanoma L cheek  Observe  Recommend daily broad spectrum sunscreen SPF 30+ to sun-exposed areas, reapply every 2 hours as needed. Call for new or changing lesions.   Skin cancer screening Return in about 1 year (around 09/06/2020).  IMarye Round, CMA, am acting as scribe  for Sarina Ser, MD .  Documentation: I have reviewed the above documentation for accuracy and completeness, and I agree with the above.  Sarina Ser, MD

## 2019-09-07 NOTE — Patient Instructions (Signed)
Cryotherapy Aftercare  . Wash gently with soap and water everyday.   . Apply Vaseline and Band-Aid daily until healed.  

## 2019-09-13 ENCOUNTER — Encounter: Payer: Self-pay | Admitting: Dermatology

## 2019-09-14 ENCOUNTER — Other Ambulatory Visit: Payer: Self-pay | Admitting: Family Medicine

## 2019-09-14 DIAGNOSIS — E039 Hypothyroidism, unspecified: Secondary | ICD-10-CM

## 2019-09-15 ENCOUNTER — Other Ambulatory Visit: Payer: Self-pay | Admitting: Family Medicine

## 2019-09-15 DIAGNOSIS — E876 Hypokalemia: Secondary | ICD-10-CM

## 2019-09-15 DIAGNOSIS — I10 Essential (primary) hypertension: Secondary | ICD-10-CM

## 2019-10-07 ENCOUNTER — Other Ambulatory Visit: Payer: Self-pay | Admitting: Family Medicine

## 2019-11-13 ENCOUNTER — Other Ambulatory Visit: Payer: Self-pay | Admitting: Family Medicine

## 2019-12-19 ENCOUNTER — Other Ambulatory Visit: Payer: Self-pay | Admitting: Family Medicine

## 2020-01-14 ENCOUNTER — Other Ambulatory Visit: Payer: Self-pay | Admitting: Family Medicine

## 2020-01-14 DIAGNOSIS — I10 Essential (primary) hypertension: Secondary | ICD-10-CM

## 2020-01-19 ENCOUNTER — Other Ambulatory Visit: Payer: Self-pay

## 2020-01-19 ENCOUNTER — Ambulatory Visit (INDEPENDENT_AMBULATORY_CARE_PROVIDER_SITE_OTHER): Payer: BC Managed Care – PPO | Admitting: Family Medicine

## 2020-01-19 ENCOUNTER — Encounter: Payer: Self-pay | Admitting: Family Medicine

## 2020-01-19 VITALS — BP 168/87 | HR 68 | Temp 98.6°F | Resp 16 | Ht 65.0 in | Wt 213.1 lb

## 2020-01-19 DIAGNOSIS — Z0000 Encounter for general adult medical examination without abnormal findings: Secondary | ICD-10-CM | POA: Diagnosis not present

## 2020-01-19 DIAGNOSIS — R7303 Prediabetes: Secondary | ICD-10-CM | POA: Diagnosis not present

## 2020-01-19 DIAGNOSIS — K589 Irritable bowel syndrome without diarrhea: Secondary | ICD-10-CM

## 2020-01-19 DIAGNOSIS — Z1231 Encounter for screening mammogram for malignant neoplasm of breast: Secondary | ICD-10-CM

## 2020-01-19 DIAGNOSIS — Z23 Encounter for immunization: Secondary | ICD-10-CM | POA: Diagnosis not present

## 2020-01-19 DIAGNOSIS — E039 Hypothyroidism, unspecified: Secondary | ICD-10-CM | POA: Diagnosis not present

## 2020-01-19 DIAGNOSIS — I10 Essential (primary) hypertension: Secondary | ICD-10-CM

## 2020-01-19 DIAGNOSIS — E782 Mixed hyperlipidemia: Secondary | ICD-10-CM

## 2020-01-19 MED ORDER — LISINOPRIL 20 MG PO TABS: 20.0000 mg | ORAL_TABLET | Freq: Every day | ORAL | 3 refills | Status: DC

## 2020-01-19 MED ORDER — HYOSCYAMINE SULFATE ER 0.375 MG PO TB12: ORAL_TABLET | ORAL | 3 refills | Status: DC

## 2020-01-19 NOTE — Patient Instructions (Signed)
Influenza (Flu) Vaccine (Inactivated or Recombinant): What You Need to Know 1. Why get vaccinated? Influenza vaccine can prevent influenza (flu). Flu is a contagious disease that spreads around the [place] every year, usually between October and May. Anyone can get the flu, but it is more dangerous for some people. Infants and young children, people 65 years of age and older, pregnant women, and people with certain health conditions or a weakened immune system are at greatest risk of flu complications. Pneumonia, bronchitis, sinus infections and ear infections are examples of flu-related complications. If you have a medical condition, such as heart disease, cancer or diabetes, flu can make it worse. Flu can cause fever and chills, sore throat, muscle aches, fatigue, cough, headache, and runny or stuffy nose. Some people may have vomiting and diarrhea, though this is more common in children than adults. Each year thousands of people in the [place] die from flu, and many more are hospitalized. Flu vaccine prevents millions of illnesses and flu-related visits to the doctor each year. 2. Influenza vaccine CDC recommends everyone 6 months of age and older get vaccinated every flu season. Children 6 months through 8 years of age may need 2 doses during a single flu season. Everyone else needs only 1 dose each flu season. It takes about 2 weeks for protection to develop after vaccination. There are many flu viruses, and they are always changing. Each year a new flu vaccine is made to protect against three or four viruses that are likely to cause disease in the upcoming flu season. Even when the vaccine doesn't exactly match these viruses, it may still provide some protection. Influenza vaccine does not cause flu. Influenza vaccine may be given at the same time as other vaccines. 3. Talk with your health care provider Tell your vaccine provider if the person getting the vaccine:  Has had an  allergic reaction after a previous dose of influenza vaccine, or has any severe, life-threatening allergies.  Has ever had Guillain-Barr Syndrome (also called GBS). In some cases, your health care provider may decide to postpone influenza vaccination to a future visit. People with minor illnesses, such as a cold, may be vaccinated. People who are moderately or severely ill should usually wait until they recover before getting influenza vaccine. Your health care provider can give you more information. 4. Risks of a vaccine reaction  Soreness, redness, and swelling where shot is given, fever, muscle aches, and headache can happen after influenza vaccine.  There may be a very small increased risk of Guillain-Barr Syndrome (GBS) after inactivated influenza vaccine (the flu shot). Young children who get the flu shot along with pneumococcal vaccine (PCV13), and/or DTaP vaccine at the same time might be slightly more likely to have a seizure caused by fever. Tell your health care provider if a child who is getting flu vaccine has ever had a seizure. People sometimes faint after medical procedures, including vaccination. Tell your provider if you feel dizzy or have vision changes or ringing in the ears. As with any medicine, there is a very remote chance of a vaccine causing a severe allergic reaction, other serious injury, or death. 5. What if there is a serious problem? An allergic reaction could occur after the vaccinated person leaves the clinic. If you see signs of a severe allergic reaction (hives, swelling of the face and throat, difficulty breathing, a fast heartbeat, dizziness, or weakness), call 9-1-1 and get the person to the nearest hospital. For other signs that   concern you, call your health care provider. Adverse reactions should be reported to the Vaccine Adverse Event Reporting System (VAERS). Your health care provider will usually file this report, or you can do it yourself. Visit the  VAERS website at www.vaers.SamedayNews.es or call (307)741-6164.VAERS is only for reporting reactions, and VAERS staff do not give medical advice. 6. The National Vaccine Injury Compensation Program The [employer] Vaccine Injury Compensation Program (VICP) is a federal program that was created to compensate people who may have been injured by certain vaccines. Visit the VICP website at GoldCloset.com.ee or call 513-469-2000 to learn about the program and about filing a claim. There is a time limit to file a claim for compensation. 7. How can I learn more?  Ask your healthcare provider.  Call your local or state health department.  Contact the Centers for Disease Control and Prevention (CDC): ? Call 469 005 6023 (1-800-CDC-INFO) or ? Visit CDC's https://gibson.com/ Vaccine Information Statement (Interim) Inactivated Influenza Vaccine (10/29/2017) This information is not intended to replace advice given to you by your health care provider. Make sure you discuss any questions you have with your health care provider. Document Revised: 06/22/2018 Document Reviewed: 11/02/2017 Elsevier Patient Education  2020 Elsevier Inc.    Preventive Care 32-41 Years Old, Female Preventive care refers to visits with your health care provider and lifestyle choices that can promote health and wellness. This includes:  A yearly physical exam. This may also be called an annual well check.  Regular dental visits and eye exams.  Immunizations.  Screening for certain conditions.  Healthy lifestyle choices, such as eating a healthy diet, getting regular exercise, not using drugs or products that contain nicotine and tobacco, and limiting alcohol use. What can I expect for my preventive care visit? Physical exam Your health care provider will check your:  Height and weight. This may be used to calculate body mass index (BMI), which tells if you are at a healthy weight.  Heart rate and blood  pressure.  Skin for abnormal spots. Counseling Your health care provider may ask you questions about your:  Alcohol, tobacco, and drug use.  Emotional well-being.  Home and relationship well-being.  Sexual activity.  Eating habits.  Work and work Statistician.  Method of birth control.  Menstrual cycle.  Pregnancy history. What immunizations do I need?  Influenza (flu) vaccine  This is recommended every year. Tetanus, diphtheria, and pertussis (Tdap) vaccine  You may need a Td booster every 10 years. Varicella (chickenpox) vaccine  You may need this if you have not been vaccinated. Zoster (shingles) vaccine  You may need this after age 24. Measles, mumps, and rubella (MMR) vaccine  You may need at least one dose of MMR if you were born in 1957 or later. You may also need a second dose. Pneumococcal conjugate (PCV13) vaccine  You may need this if you have certain conditions and were not previously vaccinated. Pneumococcal polysaccharide (PPSV23) vaccine  You may need one or two doses if you smoke cigarettes or if you have certain conditions. Meningococcal conjugate (MenACWY) vaccine  You may need this if you have certain conditions. Hepatitis A vaccine  You may need this if you have certain conditions or if you travel or work in places where you may be exposed to hepatitis A. Hepatitis B vaccine  You may need this if you have certain conditions or if you travel or work in places where you may be exposed to hepatitis B. Haemophilus influenzae type b (Hib) vaccine  You may need this if you have certain conditions. Human papillomavirus (HPV) vaccine  If recommended by your health care provider, you may need three doses over 6 months. You may receive vaccines as individual doses or as more than one vaccine together in one shot (combination vaccines). Talk with your health care provider about the risks and benefits of combination vaccines. What tests do I  need? Blood tests  Lipid and cholesterol levels. These may be checked every 5 years, or more frequently if you are over 64 years old.  Hepatitis C test.  Hepatitis B test. Screening  Lung cancer screening. You may have this screening every year starting at age 31 if you have a 30-pack-year history of smoking and currently smoke or have quit within the past 15 years.  Colorectal cancer screening. All adults should have this screening starting at age 4 and continuing until age 70. Your health care provider may recommend screening at age 52 if you are at increased risk. You will have tests every 1-10 years, depending on your results and the type of screening test.  Diabetes screening. This is done by checking your blood sugar (glucose) after you have not eaten for a while (fasting). You may have this done every 1-3 years.  Mammogram. This may be done every 1-2 years. Talk with your health care provider about when you should start having regular mammograms. This may depend on whether you have a family history of breast cancer.  BRCA-related cancer screening. This may be done if you have a family history of breast, ovarian, tubal, or peritoneal cancers.  Pelvic exam and Pap test. This may be done every 3 years starting at age 98. Starting at age 44, this may be done every 5 years if you have a Pap test in combination with an HPV test. Other tests  Sexually transmitted disease (STD) testing.  Bone density scan. This is done to screen for osteoporosis. You may have this scan if you are at high risk for osteoporosis. Follow these instructions at home: Eating and drinking  Eat a diet that includes fresh fruits and vegetables, whole grains, lean protein, and low-fat dairy.  Take vitamin and mineral supplements as recommended by your health care provider.  Do not drink alcohol if: ? Your health care provider tells you not to drink. ? You are pregnant, may be pregnant, or are planning to  become pregnant.  If you drink alcohol: ? Limit how much you have to 0-1 drink a day. ? Be aware of how much alcohol is in your drink. In the [place] one drink equals one 12 oz bottle of beer (355 mL), one 5 oz glass of wine (148 mL), or one 1 oz glass of hard liquor (44 mL). Lifestyle  Take daily care of your teeth and gums.  Stay active. Exercise for at least 30 minutes on 5 or more days each week.  Do not use any products that contain nicotine or tobacco, such as cigarettes, e-cigarettes, and chewing tobacco. If you need help quitting, ask your health care provider.  If you are sexually active, practice safe sex. Use a condom or other form of birth control (contraception) in order to prevent pregnancy and STIs (sexually transmitted infections).  If told by your health care provider, take low-dose aspirin daily starting at age 61. What's next?  Visit your health care provider once a year for a well check visit.  Ask your health care provider how often you should have your eyes  and teeth checked.  Stay up to date on all vaccines. This information is not intended to replace advice given to you by your health care provider. Make sure you discuss any questions you have with your health care provider. Document Revised: 11/12/2017 Document Reviewed: 11/12/2017 Elsevier Patient Education  2020 [employer].

## 2020-01-19 NOTE — Assessment & Plan Note (Signed)
Encourage low carb diet Recheck A1c 

## 2020-01-19 NOTE — Assessment & Plan Note (Signed)
Not currently on a statin Recheck lipid panel and CMP We will calculate ASCVD risk and determine need for statin, especially with the family history of cardiomyopathy

## 2020-01-19 NOTE — Assessment & Plan Note (Signed)
Discussed importance of healthy weight management Discussed diet and exercise  

## 2020-01-19 NOTE — Assessment & Plan Note (Signed)
Previously well controlled Continue Synthroid at current dose  Recheck TSH and adjust Synthroid as indicated   

## 2020-01-19 NOTE — Progress Notes (Signed)
Complete physical exam   Patient: Angela Villarreal   DOB: [birth year]   64 y.o. Female  MRN: [id number] Visit Date: 01/19/2020  Today's healthcare provider: [name], MD   Chief Complaint  Patient presents with  . Annual Exam   Subjective    Angela Villarreal is a 64 y.o. female who presents today for a complete physical exam.  She reports consuming a general diet. The patient does not participate in regular exercise at present. She generally feels well. She reports sleeping well. She does not have additional problems to discuss today.  HPI 03/23/2019 Mammogram-BI-RADS 1 11/27/2017 Colonoscopy-diverticulosis, polyps, Tubular adenoma    Past Medical History:  Diagnosis Date  . Asthma   . 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 Hypertension   . Hypothyroidism    Past Surgical History:  Procedure Laterality Date  . ABDOMINAL HYSTERECTOMY  2007   due to fibroids. patient reports she also had ovaries removed.   Marland Kitchen BACK SURGERY  1989, 2000  . BLADDER SURGERY  2001   Tacked  . COLONOSCOPY WITH PROPOFOL N/A 11/27/2017   Procedure: COLONOSCOPY WITH PROPOFOL;  Surgeon: Jonathon Bellows, MD;  Location: [facility] ENDOSCOPY;  Service: Gastroenterology;  Laterality: N/A;  . MELANOMA EXCISION  02/2004   removed from face  . OOPHORECTOMY     Social History   Socioeconomic History  . Marital status: Divorced    Spouse name: Not on file  . Number of children: Not on file  . Years of education: Not on file  . Highest education level: Not on file  Occupational History  . Not on file  Tobacco Use  . Smoking status: Never Smoker  . Smokeless tobacco: Never Used  Vaping Use  . Vaping Use: Never used  Substance and Sexual Activity  . Alcohol use: Yes    Comment: once monthly  . Drug use: No  . Sexual activity: Not on file  Other Topics Concern  . Not on file  Social History Narrative  . Not on file   Social Determinants of Health   Financial Resource  Strain:   . Difficulty of Paying Living Expenses: Not on file  Food Insecurity:   . Worried About Charity fundraiser in the Last Year: Not on file  . Ran Out of Food in the Last Year: Not on file  Transportation Needs:   . Lack of Transportation (Medical): Not on file  . Lack of Transportation (Non-Medical): Not on file  Physical Activity:   . Days of Exercise per Week: Not on file  . Minutes of Exercise per Session: Not on file  Stress:   . Feeling of Stress : Not on file  Social Connections:   . Frequency of Communication with Friends and Family: Not on file  . Frequency of Social Gatherings with Friends and Family: Not on file  . Attends Religious Services: Not on file  . Active Member of Clubs or Organizations: Not on file  . Attends Archivist Meetings: Not on file  . Marital Status: Not on file  Intimate Partner Violence:   . Fear of Current or Ex-Partner: Not on file  . Emotionally Abused: Not on file  . Physically Abused: Not on file  . Sexually Abused: Not on file   Family Status  Relation Name Status  . Mother  Deceased  . Father  Deceased  . Sister  Alive  . Neg Hx  (Not Specified)  Family History  Problem Relation Age of Onset  . Colon cancer Mother   . Squamous cell carcinoma Mother   . Alzheimer's disease Mother   . Cancer Mother        colon  . Non-Hodgkin's lymphoma Father   . Thyroid disease Sister   . Breast cancer Neg Hx    Allergies  Allergen Reactions  . Gentamicin Sulfate     Patient Care Team: Virginia Crews, MD as PCP - General (Family Medicine) Ralene Bathe, MD (Dermatology) Beverly Gust, MD (Otolaryngology) Jonathon Bellows, MD as Consulting Physician (Gastroenterology)   Medications: Outpatient Medications Prior to Visit  Medication Sig  . albuterol (PROVENTIL HFA;VENTOLIN HFA) 108 (90 Base) MCG/ACT inhaler Inhale 1-2 puffs into the lungs every 4 (four) hours as needed for wheezing or shortness of breath.  .  fexofenadine (ALLEGRA) 180 MG tablet Take 180 mg by mouth daily.  Marland Kitchen FLUTICASONE PROPIONATE, NASAL, NA Place into the nose.  . fluticasone-salmeterol (ADVAIR HFA) 115-21 MCG/ACT inhaler Inhale 2 puffs into the lungs 2 (two) times daily.  . metoprolol succinate (TOPROL-XL) 100 MG 24 hr tablet TAKE 1 TABLET BY MOUTH DAILY  . montelukast (SINGULAIR) 10 MG tablet Take 1 tablet (10 mg total) by mouth at bedtime.  . naproxen sodium (ALEVE) 220 MG tablet Take 220 mg by mouth 2 (two) times daily as needed.  Marland Kitchen omeprazole (PRILOSEC) 40 MG capsule Take 40 mg by mouth daily.  . potassium chloride SA (KLOR-CON) 20 MEQ tablet TAKE ONE TABLET TWICE DAILY  . SYNTHROID 125 MCG tablet TAKE 1 TABLET BY MOUTH DAILY  . triamterene-hydrochlorothiazide (MAXZIDE-25) 37.5-25 MG tablet TAKE 1 TABLET BY MOUTH DAILY  . [DISCONTINUED] hyoscyamine (LEVBID) 0.375 MG 12 hr tablet TAKE ONE TABLET (0.375 MG) BY MOUTH TWICE DAILY   No facility-administered medications prior to visit.    Review of Systems  Constitutional: Negative.   HENT: Negative.   Eyes: Negative.   Respiratory: Negative.   Cardiovascular: Negative.   Gastrointestinal: Positive for constipation and diarrhea.  Endocrine: Negative.   Genitourinary: Negative.   Musculoskeletal: Positive for neck pain.  Skin: Negative.   Allergic/Immunologic: Positive for environmental allergies.  Neurological: Negative.   Hematological: Negative.   Psychiatric/Behavioral: Negative.     Last CBC Lab Results  Component Value Date   WBC 7.1 09/05/2015   HGB 12.5 09/05/2015   HCT 36.4 09/05/2015   MCV 85 09/05/2015   MCH 29.2 09/05/2015   RDW 13.6 09/05/2015   PLT 241 78/46/9629   Last metabolic panel Lab Results  Component Value Date   GLUCOSE 105 (H) 07/14/2019   NA 139 07/14/2019   K 3.8 07/14/2019   CL 100 07/14/2019   CO2 23 07/14/2019   BUN 15 07/14/2019   CREATININE 0.95 07/14/2019   GFRNONAA 64 07/14/2019   GFRAA 74 07/14/2019   CALCIUM 9.4  07/14/2019   PROT 6.7 01/07/2019   ALBUMIN 4.1 01/07/2019   LABGLOB 2.6 01/07/2019   AGRATIO 1.6 01/07/2019   BILITOT 0.3 01/07/2019   ALKPHOS 90 01/07/2019   AST 18 01/07/2019   ALT 21 01/07/2019   Last lipids Lab Results  Component Value Date   CHOL 193 01/07/2019   HDL 42 01/07/2019   LDLCALC 129 (H) 01/07/2019   TRIG 120 01/07/2019   CHOLHDL 4.6 (H) 01/07/2019   Last hemoglobin A1c Lab Results  Component Value Date   HGBA1C 6.3 (H) 07/14/2019   Last thyroid functions Lab Results  Component Value Date   TSH  0.578 07/14/2019   T4TOTAL 8.4 09/04/2016     Objective    BP (!) 168/87 (BP Location: Left Arm, Patient Position: Sitting, Cuff Size: Normal)   Pulse 68   Temp 98.6 F (37 C) (Oral)   Resp 16   Ht 5\' 5"  (1.651 m)   Wt 213 lb 1.6 oz (96.7 kg)   SpO2 98%   BMI 35.46 kg/m  BP Readings from Last 3 Encounters:  01/19/20 (!) 168/87  07/14/19 138/75  04/14/19 138/80   Wt Readings from Last 3 Encounters:  01/19/20 213 lb 1.6 oz (96.7 kg)  07/14/19 214 lb (97.1 kg)  04/14/19 208 lb (94.3 kg)      Physical Exam Vitals reviewed.  Constitutional:      General: She is not in acute distress.    Appearance: Normal appearance. She is well-developed. She is not diaphoretic.  HENT:     Head: Normocephalic and atraumatic.     Right Ear: Tympanic membrane, ear canal and external ear normal.     Left Ear: Tympanic membrane, ear canal and external ear normal.  Eyes:     General: No scleral icterus.    Conjunctiva/sclera: Conjunctivae normal.     Pupils: Pupils are equal, round, and reactive to light.  Neck:     Thyroid: No thyromegaly.  Cardiovascular:     Rate and Rhythm: Normal rate and regular rhythm.     Pulses: Normal pulses.     Heart sounds: Normal heart sounds. No murmur heard.   Pulmonary:     Effort: Pulmonary effort is normal. No respiratory distress.     Breath sounds: Normal breath sounds. No wheezing or rales.  Chest:     Comments:  Breasts: breasts appear normal, no suspicious masses, no skin or nipple changes or axillary nodes.  Abdominal:     General: There is no distension.     Palpations: Abdomen is soft.     Tenderness: There is no abdominal tenderness.  Musculoskeletal:        General: No deformity.     Cervical back: Neck supple.     Right lower leg: No edema.     Left lower leg: No edema.  Lymphadenopathy:     Cervical: No cervical adenopathy.  Skin:    General: Skin is warm and dry.     Findings: No rash.  Neurological:     Mental Status: She is alert and oriented to person, place, and time. Mental status is at baseline.     Gait: Gait normal.  Psychiatric:        Mood and Affect: Mood normal.        Behavior: Behavior normal.        Thought Content: Thought content normal.      Last depression screening scores PHQ 2/9 Scores 01/19/2020 01/05/2019 11/03/2017  PHQ - 2 Score 0 0 0  PHQ- 9 Score 0 1 1   Last fall risk screening Fall Risk  01/19/2020  Falls in the past year? 0  Number falls in past yr: 0  Injury with Fall? 0  Risk for fall due to : No Fall Risks  Follow up Falls evaluation completed   Last Audit-C alcohol use screening Alcohol Use Disorder Test (AUDIT) 01/19/2020  1. How often do you have a drink containing alcohol? 1  2. How many drinks containing alcohol do you have on a typical day when you are drinking? 0  3. How often do you have six or more  drinks on one occasion? 0  AUDIT-C Score 1  Alcohol Brief Interventions/Follow-up AUDIT Score <7 follow-up not indicated   A score of 3 or more in women, and 4 or more in men indicates increased risk for alcohol abuse, EXCEPT if all of the points are from question 1   No results found for any visits on 01/19/20.  Assessment & Plan    Routine Health Maintenance and Physical Exam  Exercise Activities and Dietary recommendations Goals   None     Immunization History  Administered Date(s) Administered  . Influenza,inj,Quad  PF,6+ Mos 01/05/2018, 01/05/2019, 01/19/2020  . Influenza-Unspecified 01/02/2017  . PFIZER SARS-COV-2 Vaccination 05/22/2019, 06/12/2019  . Tdap 04/15/2010, 01/19/2020  . Zoster Recombinat (Shingrix) 11/03/2017, 01/05/2018    Health Maintenance  Topic Date Due  . COLONOSCOPY  11/27/2020  . MAMMOGRAM  03/22/2021  . TETANUS/TDAP  01/18/2030  . INFLUENZA VACCINE  Completed  . COVID-19 Vaccine  Completed  . Hepatitis C Screening  Completed  . HIV Screening  Completed    Discussed health benefits of physical activity, and encouraged her to engage in regular exercise appropriate for her age and condition.  Problem List Items Addressed This Visit      Cardiovascular and Mediastinum   Essential hypertension    Uncontrolled Continue metoprolol and maxzide Add lisinopril 20mg  daily May need to stop K supplement with addition of lisinopril Check metabolic panel and recheck in 1-2 weeks F/u in 1 month      Relevant Medications   lisinopril (ZESTRIL) 20 MG tablet   Other Relevant Orders   Comprehensive metabolic panel   Renal Function Panel     Digestive   IBS (irritable bowel syndrome)   Relevant Medications   hyoscyamine (LEVBID) 0.375 MG 12 hr tablet     Endocrine   Adult hypothyroidism    Previously well controlled Continue Synthroid at current dose  Recheck TSH and adjust Synthroid as indicated        Relevant Orders   TSH     Other   Hyperlipemia, mixed    Not currently on a statin Recheck lipid panel and CMP We will calculate ASCVD risk and determine need for statin, especially with the family history of cardiomyopathy      Relevant Medications   lisinopril (ZESTRIL) 20 MG tablet   Other Relevant Orders   Lipid Panel With LDL/HDL Ratio   Obesity    Discussed importance of healthy weight management Discussed diet and exercise       Prediabetes    Encourage low carb diet Recheck A1c      Relevant Orders   Hemoglobin A1c    Other Visit Diagnoses     Encounter for annual physical exam    -  Primary   Relevant Orders   Comprehensive metabolic panel   Hemoglobin A1c   Lipid Panel With LDL/HDL Ratio   TSH   Flu vaccine need       Relevant Orders   Flu Vaccine QUAD 36+ mos IM (Completed)   Encounter for screening mammogram for malignant neoplasm of breast       Relevant Orders   MM 3D SCREEN BREAST BILATERAL   Need for Tdap vaccination       Relevant Orders   Tdap vaccine greater than or equal to 7yo IM (Completed)       Return in about 6 weeks (around 03/01/2020) for BP f/u.     I, [name], MD, have reviewed all documentation for this  visit. The documentation on 01/19/20 for the exam, diagnosis, procedures, and orders are all accurate and complete.   Demichael Traum, Dionne Bucy, MD, MPH [facility]

## 2020-01-19 NOTE — Assessment & Plan Note (Signed)
Uncontrolled Continue metoprolol and maxzide Add lisinopril 20mg  daily May need to stop K supplement with addition of lisinopril Check metabolic panel and recheck in 1-2 weeks F/u in 1 month

## 2020-01-20 LAB — COMPREHENSIVE METABOLIC PANEL
ALT: 11 IU/L (ref 0–32)
AST: 12 IU/L (ref 0–40)
Albumin/Globulin Ratio: 1.8 (ref 1.2–2.2)
Albumin: 4.4 g/dL (ref 3.8–4.8)
Alkaline Phosphatase: 83 IU/L (ref 44–121)
BUN/Creatinine Ratio: 18 (ref 12–28)
BUN: 17 mg/dL (ref 8–27)
Bilirubin Total: 0.3 mg/dL (ref 0.0–1.2)
CO2: 23 mmol/L (ref 20–29)
Calcium: 9.4 mg/dL (ref 8.7–10.3)
Chloride: 100 mmol/L (ref 96–106)
Creatinine, Ser: 0.93 mg/dL (ref 0.57–1.00)
GFR calc Af Amer: 76 mL/min/{1.73_m2} (ref >59)
GFR calc non Af Amer: 66 mL/min/{1.73_m2} (ref >59)
Globulin, Total: 2.4 g/dL (ref 1.5–4.5)
Glucose: 115 mg/dL — ABNORMAL HIGH (ref 65–99)
Potassium: 4 mmol/L (ref 3.5–5.2)
Sodium: 141 mmol/L (ref 134–144)
Total Protein: 6.8 g/dL (ref 6.0–8.5)

## 2020-01-20 LAB — LIPID PANEL WITH LDL/HDL RATIO
Cholesterol, Total: 211 mg/dL — ABNORMAL HIGH (ref 100–199)
HDL: 43 mg/dL (ref >39)
LDL Chol Calc (NIH): 146 mg/dL — ABNORMAL HIGH (ref 0–99)
LDL/HDL Ratio: 3.4 ratio — ABNORMAL HIGH (ref 0.0–3.2)
Triglycerides: 121 mg/dL (ref 0–149)
VLDL Cholesterol Cal: 22 mg/dL (ref 5–40)

## 2020-01-20 LAB — HEMOGLOBIN A1C
Est. average glucose Bld gHb Est-mCnc: 131 mg/dL
Hgb A1c MFr Bld: 6.2 % — ABNORMAL HIGH (ref 4.8–5.6)

## 2020-01-20 LAB — TSH: TSH: 1.08 u[IU]/mL (ref 0.450–4.500)

## 2020-01-24 ENCOUNTER — Telehealth: Payer: Self-pay

## 2020-01-24 MED ORDER — ROSUVASTATIN CALCIUM 5 MG PO TABS: 5.0000 mg | ORAL_TABLET | Freq: Every day | ORAL | 3 refills | Status: DC

## 2020-01-24 NOTE — Telephone Encounter (Signed)
Advised patient of results. Patient is agreeable to start Crestor 5mg . Will send into the pharmacy.

## 2020-01-24 NOTE — Telephone Encounter (Signed)
-----   Message from Virginia Crews, MD sent at 01/23/2020 11:03 AM EST ----- Normal kidney function, liver function, electrolytes.  Stable prediabetes.  Recommend low carb diet.  Cholesterol is high and the 10-year ASCVD risk score Angela Villarreal DC Jr., et al., 2013) is: 12%, which is high.  Recommend statin to lower this risk.  If patient is agreeable, could start crestor 5mg  daily.

## 2020-01-28 LAB — RENAL FUNCTION PANEL
Albumin: 4.4 g/dL (ref 3.8–4.8)
BUN/Creatinine Ratio: 22 (ref 12–28)
BUN: 20 mg/dL (ref 8–27)
CO2: 24 mmol/L (ref 20–29)
Calcium: 9.5 mg/dL (ref 8.7–10.3)
Chloride: 100 mmol/L (ref 96–106)
Creatinine, Ser: 0.93 mg/dL (ref 0.57–1.00)
GFR calc Af Amer: 76 mL/min/{1.73_m2} (ref >59)
GFR calc non Af Amer: 66 mL/min/{1.73_m2} (ref >59)
Glucose: 115 mg/dL — ABNORMAL HIGH (ref 65–99)
Phosphorus: 3.7 mg/dL (ref 3.0–4.3)
Potassium: 4.1 mmol/L (ref 3.5–5.2)
Sodium: 139 mmol/L (ref 134–144)

## 2020-01-30 NOTE — Progress Notes (Signed)
Yes.  Continue her current potassium dose of 1 tab daily.

## 2020-03-01 ENCOUNTER — Encounter: Payer: Self-pay | Admitting: Family Medicine

## 2020-03-01 ENCOUNTER — Other Ambulatory Visit: Payer: Self-pay

## 2020-03-01 ENCOUNTER — Ambulatory Visit: Payer: BC Managed Care – PPO | Admitting: Family Medicine

## 2020-03-01 VITALS — BP 132/76 | HR 76 | Temp 98.6°F | Resp 16 | Ht 65.0 in | Wt 212.0 lb

## 2020-03-01 DIAGNOSIS — R7303 Prediabetes: Secondary | ICD-10-CM | POA: Diagnosis not present

## 2020-03-01 DIAGNOSIS — E669 Obesity, unspecified: Secondary | ICD-10-CM | POA: Diagnosis not present

## 2020-03-01 DIAGNOSIS — K589 Irritable bowel syndrome without diarrhea: Secondary | ICD-10-CM

## 2020-03-01 DIAGNOSIS — K580 Irritable bowel syndrome with diarrhea: Secondary | ICD-10-CM

## 2020-03-01 DIAGNOSIS — Z6835 Body mass index (BMI) 35.0-35.9, adult: Secondary | ICD-10-CM

## 2020-03-01 DIAGNOSIS — E876 Hypokalemia: Secondary | ICD-10-CM

## 2020-03-01 DIAGNOSIS — I10 Essential (primary) hypertension: Secondary | ICD-10-CM | POA: Diagnosis not present

## 2020-03-01 MED ORDER — HYOSCYAMINE SULFATE ER 0.375 MG PO TB12: 0.3750 mg | ORAL_TABLET | Freq: Two times a day (BID) | ORAL | 3 refills | Status: DC | PRN

## 2020-03-01 NOTE — Progress Notes (Signed)
Established patient visit   Patient: Angela Villarreal   DOB: 20-Dec-1955   64 y.o. Female  MRN: [id number] Visit Date: 03/01/2020  Today's healthcare provider: [name], MD   Chief Complaint  Patient presents with  . Follow-up   Subjective    HPI    Ms. Seres has been doing well. She reports no side effects with her medications. She has one episode of dizziness with standing that resolved spontaneously. She reports a low sodium diet. She has been trying to reduce cards with replacements such as chic peas. She has been replacing her sugar with stevia and trying to eat more fish. She reports no exercise currently, but plans to walk 30-60 mins a day.        Medications: Outpatient Medications Prior to Visit  Medication Sig  . albuterol (PROVENTIL HFA;VENTOLIN HFA) 108 (90 Base) MCG/ACT inhaler Inhale 1-2 puffs into the lungs every 4 (four) hours as needed for wheezing or shortness of breath.  . fexofenadine (ALLEGRA) 180 MG tablet Take 180 mg by mouth daily.  Marland Kitchen FLUTICASONE PROPIONATE, NASAL, NA Place into the nose.  . fluticasone-salmeterol (ADVAIR HFA) 115-21 MCG/ACT inhaler Inhale 2 puffs into the lungs 2 (two) times daily.  Marland Kitchen lisinopril (ZESTRIL) 20 MG tablet Take 1 tablet (20 mg total) by mouth daily.  . metoprolol succinate (TOPROL-XL) 100 MG 24 hr tablet TAKE 1 TABLET BY MOUTH DAILY  . montelukast (SINGULAIR) 10 MG tablet Take 1 tablet (10 mg total) by mouth at bedtime.  . naproxen sodium (ALEVE) 220 MG tablet Take 220 mg by mouth 2 (two) times daily as needed.  Marland Kitchen omeprazole (PRILOSEC) 40 MG capsule Take 40 mg by mouth daily.  . potassium chloride SA (KLOR-CON) 20 MEQ tablet TAKE ONE TABLET TWICE DAILY (Patient taking differently: Take 20 mEq by mouth once.)  . rosuvastatin (CRESTOR) 5 MG tablet Take 1 tablet (5 mg total) by mouth daily.  Marland Kitchen SYNTHROID 125 MCG tablet TAKE 1 TABLET BY MOUTH DAILY  . triamterene-hydrochlorothiazide (MAXZIDE-25) 37.5-25 MG tablet  TAKE 1 TABLET BY MOUTH DAILY  . [DISCONTINUED] hyoscyamine (LEVBID) 0.375 MG 12 hr tablet TAKE ONE TABLET (0.375 MG) BY MOUTH TWICE DAILY   No facility-administered medications prior to visit.    Review of Systems  Constitutional: Negative.   HENT: Negative.   Eyes: Negative.   Respiratory: Negative.   Cardiovascular: Negative.   Gastrointestinal: Negative.   Endocrine: Negative.   Genitourinary: Negative.   Musculoskeletal: Negative.   Skin: Negative.   Allergic/Immunologic: Negative.   Neurological: Negative.   Hematological: Negative.   Psychiatric/Behavioral: Negative.       Objective    BP 132/76 (BP Location: Left Arm, Patient Position: Sitting, Cuff Size: Normal)   Pulse 76   Temp 98.6 F (37 C) (Oral)   Resp 16   Ht 5\' 5"  (1.651 m)   Wt 212 lb (96.2 kg)   SpO2 97%   BMI 35.28 kg/m    Physical Exam   General: Well appearing in NAD Lungs: CTA bilaterally Cards: RRR no murmurs rubs or gallops GI: Soft non-tender   No results found for any visits on 03/01/20.  Assessment & Plan     Problem List Items Addressed This Visit      Cardiovascular and Mediastinum   Essential hypertension - Primary    132/76 today, down from 836 systolic 1 month ago Continue current medications Lisinopril, Metoprolol, Triamterene BMP today Consider discontinuing K+ supplement if BMP shows a normal -  high K F/u 3 months      Relevant Orders   Basic Metabolic Panel (BMET)     Digestive   IBS (irritable bowel syndrome)    Likely stress related, improving with Hyoscyamine Continue current medications.       Relevant Medications   hyoscyamine (LEVBID) 0.375 MG 12 hr tablet     Other   Obesity    Discussed importance of weight management Discussed weight loss practices with diet and exercise.       Prediabetes    Discussed importance of healthy diet Discussed low carb options Recheck A1c in 3 months       Other Visit Diagnoses    Hypokalemia       Relevant  Orders   Basic Metabolic Panel (BMET)       Return in about 3 months (around 05/30/2020) for chronic disease f/u.      Patient seen along with MS3 student [facility]. I personally evaluated this patient along with the student, and verified all aspects of the history, physical exam, and medical decision making as documented by the student. I agree with the student's documentation and have made all necessary edits.  Jacobo Moncrief, Dionne Bucy, MD, MPH [facility]

## 2020-03-01 NOTE — Assessment & Plan Note (Signed)
Discussed importance of weight management Discussed weight loss practices with diet and exercise.

## 2020-03-01 NOTE — Assessment & Plan Note (Signed)
Discussed importance of healthy diet Discussed low carb options Recheck A1c in 3 months

## 2020-03-01 NOTE — Assessment & Plan Note (Signed)
Likely stress related, improving with Hyoscyamine Continue current medications.

## 2020-03-01 NOTE — Assessment & Plan Note (Addendum)
132/76 today, down from 536 systolic 1 month ago Continue current medications Lisinopril, Metoprolol, Triamterene BMP today Consider discontinuing K+ supplement if BMP shows a normal -high K F/u 3 months

## 2020-03-02 ENCOUNTER — Telehealth: Payer: Self-pay

## 2020-03-02 DIAGNOSIS — E876 Hypokalemia: Secondary | ICD-10-CM

## 2020-03-02 LAB — BASIC METABOLIC PANEL
BUN/Creatinine Ratio: 27 (ref 12–28)
BUN: 22 mg/dL (ref 8–27)
CO2: 24 mmol/L (ref 20–29)
Calcium: 9.2 mg/dL (ref 8.7–10.3)
Chloride: 100 mmol/L (ref 96–106)
Creatinine, Ser: 0.83 mg/dL (ref 0.57–1.00)
GFR calc Af Amer: 86 mL/min/{1.73_m2} (ref >59)
GFR calc non Af Amer: 75 mL/min/{1.73_m2} (ref >59)
Glucose: 114 mg/dL — ABNORMAL HIGH (ref 65–99)
Potassium: 3.9 mmol/L (ref 3.5–5.2)
Sodium: 140 mmol/L (ref 134–144)

## 2020-03-02 NOTE — Telephone Encounter (Signed)
Patient advised as below. Patient verbalizes understanding and is in agreement with treatment plan.  

## 2020-03-02 NOTE — Telephone Encounter (Signed)
-----   Message from Virginia Crews, MD sent at 03/02/2020  8:12 AM EST ----- Normal labs. Potassium is mid-range. You could hold the potassium supplement and we could recheck 2 weeks after that if you want to see if you can get off of it.

## 2020-03-15 ENCOUNTER — Other Ambulatory Visit: Payer: Self-pay | Admitting: Family Medicine

## 2020-03-15 DIAGNOSIS — I10 Essential (primary) hypertension: Secondary | ICD-10-CM

## 2020-03-15 NOTE — Telephone Encounter (Signed)
Requested Prescriptions  Pending Prescriptions Disp Refills   metoprolol succinate (TOPROL-XL) 100 MG 24 hr tablet [Pharmacy Med Name: METOPROLOL SUCCINATE ER 100 MG TAB] 90 tablet 0    Sig: TAKE 1 TABLET BY MOUTH DAILY     Cardiovascular:  Beta Blockers Passed - 03/15/2020 12:11 PM      Passed - Last BP in normal range    BP Readings from Last 1 Encounters:  03/01/20 132/76         Passed - Last Heart Rate in normal range    Pulse Readings from Last 1 Encounters:  03/01/20 76         Passed - Valid encounter within last 6 months    Recent Outpatient Visits          2 weeks ago Essential hypertension   [facility] [place], Dionne Bucy, MD   1 month ago Encounter for annual physical exam   [facility] [place], Dionne Bucy, MD   8 months ago Essential hypertension   [facility] [place], Dionne Bucy, MD   11 months ago Essential hypertension   [facility] [place], Dionne Bucy, MD   1 year ago Essential hypertension   [place], Dionne Bucy, MD      Future Appointments            In 2 months Bacigalupo, Dionne Bucy, MD [facility]            triamterene-hydrochlorothiazide (MAXZIDE-25) 37.5-25 MG tablet [Pharmacy Med Name: TRIAMTERENE-HCTZ 37.5-25 MG TAB] 90 tablet 0    Sig: TAKE 1 TABLET BY MOUTH DAILY     Cardiovascular: Diuretic Combos Passed - 03/15/2020 12:11 PM      Passed - K in normal range and within 360 days    Potassium  Date Value Ref Range Status  03/01/2020 3.9 3.5 - 5.2 mmol/L Final         Passed - Na in normal range and within 360 days    Sodium  Date Value Ref Range Status  03/01/2020 140 134 - 144 mmol/L Final         Passed - Cr in normal range and within 360 days    Creatinine, Ser  Date Value Ref Range Status  03/01/2020 0.83 0.57 - 1.00 mg/dL Final         Passed - Ca in normal range and within 360 days    Calcium  Date Value Ref  Range Status  03/01/2020 9.2 8.7 - 10.3 mg/dL Final         Passed - Last BP in normal range    BP Readings from Last 1 Encounters:  03/01/20 132/76         Passed - Valid encounter within last 6 months    Recent Outpatient Visits          2 weeks ago Essential hypertension   [place], Dionne Bucy, MD   1 month ago Encounter for annual physical exam   [facility] [place], Dionne Bucy, MD   8 months ago Essential hypertension   [place], Dionne Bucy, MD   11 months ago Essential hypertension   [place], Dionne Bucy, MD   1 year ago Essential hypertension   [facility] Bacigalupo, Dionne Bucy, MD      Future Appointments            In 2 months Bacigalupo, Dionne Bucy, MD  [facility]            lisinopril (ZESTRIL) 20 MG tablet [Pharmacy Med Name: LISINOPRIL 20 MG TAB] 90 tablet 0    Sig: TAKE ONE TABLET BY MOUTH EVERY DAY     Cardiovascular:  ACE Inhibitors Passed - 03/15/2020 12:11 PM      Passed - Cr in normal range and within 180 days    Creatinine, Ser  Date Value Ref Range Status  03/01/2020 0.83 0.57 - 1.00 mg/dL Final         Passed - K in normal range and within 180 days    Potassium  Date Value Ref Range Status  03/01/2020 3.9 3.5 - 5.2 mmol/L Final         Passed - Patient is not pregnant      Passed - Last BP in normal range    BP Readings from Last 1 Encounters:  03/01/20 132/76         Passed - Valid encounter within last 6 months    Recent Outpatient Visits          2 weeks ago Essential hypertension   [facility] [place], Marzella Schlein, MD   1 month ago Encounter for annual physical exam   [facility] [place], Marzella Schlein, MD   8 months ago Essential hypertension   [facility] [place], Marzella Schlein, MD   11 months ago Essential hypertension   [facility]  Bacigalupo, Marzella Schlein, MD   1 year ago Essential hypertension   [facility] Bacigalupo, Marzella Schlein, MD      Future Appointments            In 2 months Bacigalupo, Marzella Schlein, MD [facility]

## 2020-03-24 LAB — BASIC METABOLIC PANEL
BUN/Creatinine Ratio: 17 (ref 12–28)
BUN: 17 mg/dL (ref 8–27)
CO2: 25 mmol/L (ref 20–29)
Calcium: 9.4 mg/dL (ref 8.7–10.3)
Chloride: 99 mmol/L (ref 96–106)
Creatinine, Ser: 0.98 mg/dL (ref 0.57–1.00)
GFR calc Af Amer: 71 mL/min/{1.73_m2} (ref >59)
GFR calc non Af Amer: 61 mL/min/{1.73_m2} (ref >59)
Glucose: 119 mg/dL — ABNORMAL HIGH (ref 65–99)
Potassium: 3.5 mmol/L (ref 3.5–5.2)
Sodium: 139 mmol/L (ref 134–144)

## 2020-04-16 ENCOUNTER — Other Ambulatory Visit: Payer: Self-pay | Admitting: Family Medicine

## 2020-04-16 DIAGNOSIS — I10 Essential (primary) hypertension: Secondary | ICD-10-CM

## 2020-06-07 ENCOUNTER — Telehealth (INDEPENDENT_AMBULATORY_CARE_PROVIDER_SITE_OTHER): Payer: BC Managed Care – PPO | Admitting: Family Medicine

## 2020-06-07 ENCOUNTER — Encounter: Payer: Self-pay | Admitting: Family Medicine

## 2020-06-07 VITALS — Temp 98.7°F | Ht 66.0 in | Wt 204.0 lb

## 2020-06-07 DIAGNOSIS — J301 Allergic rhinitis due to pollen: Secondary | ICD-10-CM

## 2020-06-07 DIAGNOSIS — J45909 Unspecified asthma, uncomplicated: Secondary | ICD-10-CM | POA: Diagnosis not present

## 2020-06-07 MED ORDER — PREDNISONE 10 MG PO TABS: ORAL_TABLET | ORAL | 0 refills | Status: DC

## 2020-06-07 NOTE — Progress Notes (Signed)
MyChart Video Visit    Virtual Visit via Video Note   This visit type was conducted due to national recommendations for restrictions regarding the COVID-19 Pandemic (e.g. social distancing) in an effort to limit this patient's exposure and mitigate transmission in our community. This patient is at least at moderate risk for complications without adequate follow up. This format is felt to be most appropriate for this patient at this time. Physical exam was limited by quality of the video and audio technology used for the visit.    Patient location: home Provider location: [place] involved in the visit: patient, provider  I discussed the limitations of evaluation and management by telemedicine and the availability of in person appointments. The patient expressed understanding and agreed to proceed.  Patient: Angela Villarreal   DOB: 1955/03/20   65 y.o. Female  MRN: [id number] Visit Date: 06/07/2020  Today's healthcare provider: [name], MD   Chief Complaint  Patient presents with  . Allergic Rhinitis    Subjective    Allergic Reaction This is a new problem. The current episode started 5 to 7 days ago. The problem occurs constantly. The problem has been gradually worsening since onset. The problem is moderate. Associated with: detergent. The exposure occurred at home. Associated symptoms include coughing, eye itching, eye redness, eye watering and itching. Pertinent negatives include no chest pain, difficulty breathing, hyperventilation, rash, trouble swallowing or wheezing. There is no swelling present. Past treatments include one or more OTC medications and diphenhydramine. The treatment provided mild relief. Her past medical history is significant for seasonal allergies.     Has trouble with allergies every year Got triggered after exposure to highly scented laundry detergent and perfume Taking flonase and antihsitamine  Now hoarse and sinus  congestion and cough Tried delsym, benadryl, robitussin  ---------------------------------------------------------------------------------------------------  Patient Active Problem List   Diagnosis Date Noted  . Sprain of anterior talofibular ligament of left ankle 07/14/2019  . Prediabetes 07/14/2019  . Venous insufficiency of both lower extremities 01/05/2019  . History of adenomatous polyp of colon 12/01/2017  . Varicose veins of bilateral lower extremities with pain 09/04/2016  . History of malignant melanoma 09/04/2016  . IBS (irritable bowel syndrome) 09/04/2015  . Post-menopausal 11/22/2014  . Allergic rhinitis 08/17/2014  . Airway hyperreactivity 08/17/2014  . Family history of cardiomyopathy 08/17/2014  . Essential hypertension 08/17/2014  . Adult hypothyroidism 08/17/2014  . Obesity 08/17/2014  . Arthritis, degenerative 08/17/2014  . Cardiac murmur 07/18/2014  . Hyperlipemia, mixed 07/17/2014   Social History   Tobacco Use  . Smoking status: Never Smoker  . Smokeless tobacco: Never Used  Vaping Use  . Vaping Use: Never used  Substance Use Topics  . Alcohol use: Yes    Comment: once monthly  . Drug use: No   Allergies  Allergen Reactions  . Gentamicin Sulfate       Medications: Outpatient Medications Prior to Visit  Medication Sig  . albuterol (PROVENTIL HFA;VENTOLIN HFA) 108 (90 Base) MCG/ACT inhaler Inhale 1-2 puffs into the lungs every 4 (four) hours as needed for wheezing or shortness of breath.  . fexofenadine (ALLEGRA) 180 MG tablet Take 180 mg by mouth daily.  Marland Kitchen FLUTICASONE PROPIONATE, NASAL, NA Place into the nose.  . fluticasone-salmeterol (ADVAIR HFA) 115-21 MCG/ACT inhaler Inhale 2 puffs into the lungs 2 (two) times daily.  . hyoscyamine (LEVBID) 0.375 MG 12 hr tablet Take 1 tablet (0.375 mg total) by mouth every 12 (twelve) hours as  needed. TAKE ONE TABLET (0.375 MG) BY MOUTH TWICE DAILY  . lisinopril (ZESTRIL) 20 MG tablet TAKE ONE TABLET BY  MOUTH EVERY DAY  . metoprolol succinate (TOPROL-XL) 100 MG 24 hr tablet TAKE 1 TABLET BY MOUTH DAILY  . montelukast (SINGULAIR) 10 MG tablet Take 1 tablet (10 mg total) by mouth at bedtime.  . naproxen sodium (ALEVE) 220 MG tablet Take 220 mg by mouth 2 (two) times daily as needed.  Marland Kitchen omeprazole (PRILOSEC) 40 MG capsule Take 40 mg by mouth daily.  . potassium chloride SA (KLOR-CON) 20 MEQ tablet TAKE ONE TABLET TWICE DAILY (Patient taking differently: Take 20 mEq by mouth once.)  . rosuvastatin (CRESTOR) 5 MG tablet Take 1 tablet (5 mg total) by mouth daily.  Marland Kitchen SYNTHROID 125 MCG tablet TAKE 1 TABLET BY MOUTH DAILY  . triamterene-hydrochlorothiazide (MAXZIDE-25) 37.5-25 MG tablet TAKE 1 TABLET BY MOUTH DAILY   No facility-administered medications prior to visit.    Review of Systems  Constitutional: Negative for activity change, appetite change, chills, fatigue and fever.  HENT: Positive for congestion, ear pain, postnasal drip, rhinorrhea, sneezing, sore throat and voice change. Negative for sinus pressure, sinus pain and trouble swallowing.   Eyes: Positive for redness and itching. Negative for visual disturbance.  Respiratory: Positive for cough, chest tightness and shortness of breath. Negative for wheezing.   Cardiovascular: Negative for chest pain and palpitations.  Skin: Positive for itching. Negative for rash.    Last CBC Lab Results  Component Value Date   WBC 7.1 09/05/2015   HGB 12.5 09/05/2015   HCT 36.4 09/05/2015   MCV 85 09/05/2015   MCH 29.2 09/05/2015   RDW 13.6 09/05/2015   PLT 241 13/24/4010   Last metabolic panel Lab Results  Component Value Date   GLUCOSE 119 (H) 03/23/2020   NA 139 03/23/2020   K 3.5 03/23/2020   CL 99 03/23/2020   CO2 25 03/23/2020   BUN 17 03/23/2020   CREATININE 0.98 03/23/2020   GFRNONAA 61 03/23/2020   GFRAA 71 03/23/2020   CALCIUM 9.4 03/23/2020   PHOS 3.7 01/27/2020   PROT 6.8 01/19/2020   ALBUMIN 4.4 01/27/2020   LABGLOB  2.4 01/19/2020   AGRATIO 1.8 01/19/2020   BILITOT 0.3 01/19/2020   ALKPHOS 83 01/19/2020   AST 12 01/19/2020   ALT 11 01/19/2020   Last lipids Lab Results  Component Value Date   CHOL 211 (H) 01/19/2020   HDL 43 01/19/2020   LDLCALC 146 (H) 01/19/2020   TRIG 121 01/19/2020   CHOLHDL 4.6 (H) 01/07/2019   Last hemoglobin A1c Lab Results  Component Value Date   HGBA1C 6.2 (H) 01/19/2020      Objective    Temp 98.7 F (37.1 C) (Oral)   Ht 5\' 6"  (1.676 m)   Wt 204 lb (92.5 kg)   BMI 32.93 kg/m  BP Readings from Last 3 Encounters:  03/01/20 132/76  01/19/20 (!) 168/87  07/14/19 138/75   Wt Readings from Last 3 Encounters:  06/07/20 204 lb (92.5 kg)  03/01/20 212 lb (96.2 kg)  01/19/20 213 lb 1.6 oz (96.7 kg)      Physical Exam Constitutional:      General: She is not in acute distress.    Appearance: Normal appearance.  HENT:     Head: Normocephalic.  Pulmonary:     Effort: Pulmonary effort is normal. No respiratory distress.  Neurological:     Mental Status: She is alert. Mental status is at baseline.  Assessment & Plan     1. Seasonal allergic rhinitis due to pollen 2. Moderate asthma without complication, unspecified whether persistent - has tried home remedies - now with exacerbation of airway hypersensitivity and allergies - will try prednisone burst and taper - discussed return precautions   Return if symptoms worsen or fail to improve.     I discussed the assessment and treatment plan with the patient. The patient was provided an opportunity to ask questions and all were answered. The patient agreed with the plan and demonstrated an understanding of the instructions.   The patient was advised to call back or seek an in-person evaluation if the symptoms worsen or if the condition fails to improve as anticipated.  I, [name], MD, have reviewed all documentation for this visit. The documentation on 06/07/20 for the exam,  diagnosis, procedures, and orders are all accurate and complete.   Bacigalupo, Dionne Bucy, MD, MPH [facility]

## 2020-06-08 ENCOUNTER — Ambulatory Visit: Payer: BC Managed Care – PPO | Admitting: Family Medicine

## 2020-06-08 ENCOUNTER — Telehealth: Payer: BC Managed Care – PPO | Admitting: Family Medicine

## 2020-06-12 ENCOUNTER — Ambulatory Visit
Admission: RE | Admit: 2020-06-12 | Discharge: 2020-06-12 | Disposition: A | Discharge status: Home / Self Care | Payer: BC Managed Care – PPO | Source: Ambulatory Visit | Attending: Family Medicine | Admitting: Family Medicine

## 2020-06-12 ENCOUNTER — Other Ambulatory Visit: Payer: Self-pay

## 2020-06-12 DIAGNOSIS — Z1231 Encounter for screening mammogram for malignant neoplasm of breast: Secondary | ICD-10-CM | POA: Diagnosis present

## 2020-06-17 ENCOUNTER — Other Ambulatory Visit: Payer: Self-pay | Admitting: Family Medicine

## 2020-06-25 ENCOUNTER — Ambulatory Visit: Payer: Self-pay | Admitting: *Deleted

## 2020-06-25 NOTE — Telephone Encounter (Signed)
Patient is calling to report she has been sick for several weeks: Patient reports she did virtual visit with PCP due to allergy symptoms and was treated with Prednisone which helped. Patient states she got stomach virus after that- but has improved- she was still fighting allergy symptoms and dehydration- but seemed to be improving. Patient states she had an episode of palpitations/fast heart rate with weakness Friday pm into Saturday am- she finally felt better- did not seek medical attention- thought it was a panic attack due to all the stress she is under recently.Patient reports her pulse has returned to her normal rate and she is not having symptoms anymore. Patient is now at the beach and plans to return Wednesday. Patient is requesting appointment on Thursday when she returns. Advised patient she should go to UC to be checked regarding her heart rate changes- she states she will go if she has another episode. Advised patient due to her allergy symptoms- I am unable to schedule her in the office- which is what she is requesting. Advised her I will send note to PCP for review of possible office visit- advised patient she needs to screen for COVID to clear that as reason for her symptoms- even with her allergy background. Reason for Disposition . Age > 60 years (Exception: brief heartbeat symptoms that went away and now feels well) . [1] Taking antihistamines > 2 days AND [2] nasal allergy symptoms interfere with sleep, school, or work  Answer Assessment - Initial Assessment Questions 1. DESCRIPTION: "Please describe your heart rate or heartbeat that you are having" (e.g., fast/slow, regular/irregular, skipped or extra beats, "palpitations")     Fast rate- felt heart was racing- palpitations lasted Fr/Sat- patient is thinking it anxiety- heart rate has returned to her normal- average 74-88 2. ONSET: "When did it start?" (Minutes, hours or days)      Fri/Sat 3. DURATION: "How long does it last" (e.g.,  seconds, minutes, hours)     All night into the morning 4. PATTERN "Does it come and go, or has it been constant since it started?"  "Does it get worse with exertion?"   "Are you feeling it now?"     Comes and goes- not feeling it now 5. TAP: "Using your hand, can you tap out what you are feeling on a chair or table in front of you, so that I can hear?" (Note: not all patients can do this)       n/a 6. HEART RATE: "Can you tell me your heart rate?" "How many beats in 15 seconds?"  (Note: not all patients can do this)      90 7. RECURRENT SYMPTOM: "Have you ever had this before?" If Yes, ask: "When was the last time?" and "What happened that time?"      No 8. CAUSE: "What do you think is causing the palpitations?"     Unsure- panic attack 9. CARDIAC HISTORY: "Do you have any history of heart disease?" (e.g., heart attack, angina, bypass surgery, angioplasty, arrhythmia)      High blood pressure 10. OTHER SYMPTOMS: "Do you have any other symptoms?" (e.g., dizziness, chest pain, sweating, difficulty breathing)       During episode- weakness, shoulder pain, arms weak 11. PREGNANCY: "Is there any chance you are pregnant?" "When was your last menstrual period?"       n/a  Answer Assessment - Initial Assessment Questions 1. SYMPTOM: "What's the main symptom you're concerned about?" (e.g., runny nose, stuffiness, sneezing, itching)  Runny nose, cough 2. SEVERITY: "How bad is it?" "What does it keep you from doing?" (e.g., sleeping, working)      Constant nasal drainage- clear 3. EYES: "Are the eyes also red, watery, and itchy?"      n/a 4. TRIGGER: "What pollen or other allergic substance do you think is causing the symptoms?"     Pollen  5. TREATMENT: "What medicine are you using?" "What medicine worked best in the past?"     Patient has strong history of allergy- presently using Benadryl/Delsym 6. OTHER SYMPTOMS: "Do you have any other symptoms?" (e.g., coughing, difficulty breathing,  wheezing)     cough 7. PREGNANCY: "Is there any chance you are pregnant?" "When was your last menstrual period?"     n/a  Protocols used: HEART RATE AND HEARTBEAT QUESTIONS-A-AH, NASAL ALLERGIES (HAY FEVER)-A-AH

## 2020-06-25 NOTE — Telephone Encounter (Signed)
Apt 06/28/2020 at 8am  Thanks,   -Mickel Baas

## 2020-06-28 ENCOUNTER — Encounter: Payer: Self-pay | Admitting: Family Medicine

## 2020-06-28 ENCOUNTER — Other Ambulatory Visit: Payer: Self-pay

## 2020-06-28 ENCOUNTER — Ambulatory Visit: Payer: BC Managed Care – PPO | Admitting: Family Medicine

## 2020-06-28 VITALS — BP 121/81 | HR 82 | Temp 98.9°F | Resp 16 | Wt 213.0 lb

## 2020-06-28 DIAGNOSIS — J301 Allergic rhinitis due to pollen: Secondary | ICD-10-CM | POA: Diagnosis not present

## 2020-06-28 DIAGNOSIS — R000 Tachycardia, unspecified: Secondary | ICD-10-CM

## 2020-06-28 DIAGNOSIS — J45909 Unspecified asthma, uncomplicated: Secondary | ICD-10-CM

## 2020-06-28 DIAGNOSIS — F411 Generalized anxiety disorder: Secondary | ICD-10-CM

## 2020-06-28 DIAGNOSIS — I471 Supraventricular tachycardia: Secondary | ICD-10-CM

## 2020-06-28 MED ORDER — LORATADINE 10 MG PO TABS: 10.0000 mg | ORAL_TABLET | Freq: Every day | ORAL | 11 refills | Status: DC

## 2020-06-28 MED ORDER — ALBUTEROL SULFATE HFA 108 (90 BASE) MCG/ACT IN AERS: 1.0000 | INHALATION_SPRAY | RESPIRATORY_TRACT | 3 refills | Status: DC | PRN

## 2020-06-28 MED ORDER — SERTRALINE HCL 50 MG PO TABS: 50.0000 mg | ORAL_TABLET | Freq: Every day | ORAL | 5 refills | Status: DC

## 2020-06-28 MED ORDER — PREDNISONE 20 MG PO TABS: 20.0000 mg | ORAL_TABLET | Freq: Every day | ORAL | 1 refills | Status: DC

## 2020-06-28 NOTE — Patient Instructions (Signed)
Use sinus rinses for Allergies. Stop Allegra. Restart Potassium.

## 2020-06-28 NOTE — Progress Notes (Signed)
I,Angela Villarreal,acting as a scribe for Angela Durie, MD.,have documented all relevant documentation on the behalf of Angela Durie, MD,as directed by  Angela Durie, MD while in the presence of Angela Durie, MD.  Established patient visit   Patient: Angela Villarreal   DOB: 1955-05-06   65 y.o. Female  MRN: [id number] Visit Date: 06/28/2020  Today's healthcare provider: [name], MD   Chief Complaint  Patient presents with  . Allergies   Subjective    HPI  Patient is here concerning ongoing allergies symptoms. Patient had a virtual visit 06/07/2020 and was given prednisone. Patient states after completing prednisone she had a stomach virus. Patient states allergy symptoms have flared back up. Patient states she also had feeling of panic and anxiety, where her heart rate has increased. Allergy symptoms have worsened. Patient has been taking benadryl, pro-air inhaler, and Mucinex. She has been having a lot of allergy symptoms recently and a lot of nasal congestion. She has been having problems with anxiety since Christmas.      Medications: Outpatient Medications Prior to Visit  Medication Sig  . FLUTICASONE PROPIONATE, NASAL, NA Place into the nose.  . fluticasone-salmeterol (ADVAIR HFA) 115-21 MCG/ACT inhaler Inhale 2 puffs into the lungs 2 (two) times daily.  . hyoscyamine (LEVBID) 0.375 MG 12 hr tablet Take 1 tablet (0.375 mg total) by mouth every 12 (twelve) hours as needed. TAKE ONE TABLET (0.375 MG) BY MOUTH TWICE DAILY  . lisinopril (ZESTRIL) 20 MG tablet TAKE ONE TABLET BY MOUTH EVERY DAY  . metoprolol succinate (TOPROL-XL) 100 MG 24 hr tablet TAKE 1 TABLET BY MOUTH DAILY  . montelukast (SINGULAIR) 10 MG tablet Take 1 tablet (10 mg total) by mouth at bedtime.  . naproxen sodium (ALEVE) 220 MG tablet Take 220 mg by mouth 2 (two) times daily as needed.  Marland Kitchen omeprazole (PRILOSEC) 40 MG capsule Take 40 mg by mouth daily.  . potassium chloride SA  (KLOR-CON) 20 MEQ tablet TAKE ONE TABLET TWICE DAILY (Patient taking differently: Take 20 mEq by mouth once.)  . rosuvastatin (CRESTOR) 5 MG tablet Take 1 tablet (5 mg total) by mouth daily.  Marland Kitchen SYNTHROID 125 MCG tablet TAKE 1 TABLET BY MOUTH DAILY  . triamterene-hydrochlorothiazide (MAXZIDE-25) 37.5-25 MG tablet TAKE 1 TABLET BY MOUTH DAILY  . [DISCONTINUED] albuterol (PROVENTIL HFA;VENTOLIN HFA) 108 (90 Base) MCG/ACT inhaler Inhale 1-2 puffs into the lungs every 4 (four) hours as needed for wheezing or shortness of breath.  . [DISCONTINUED] fexofenadine (ALLEGRA) 180 MG tablet Take 180 mg by mouth daily.  . fluticasone (FLONASE) 50 MCG/ACT nasal spray Place 2 sprays into both nostrils daily.  . [DISCONTINUED] predniSONE (DELTASONE) 10 MG tablet Take 60mg  PO daily x1d, then 50mg  daily x1d, then 40mg  daily x1d, then 30mg  daily x1d, then 20mg  daily x1d, then 10mg  daily x1d, then stop (Patient not taking: Reported on 06/28/2020)   No facility-administered medications prior to visit.    Review of Systems  Constitutional: Negative for appetite change, chills, fatigue and fever.  HENT: Positive for congestion.   Respiratory: Positive for cough and wheezing. Negative for chest tightness and shortness of breath.   Cardiovascular: Negative for chest pain and palpitations.  Gastrointestinal: Negative for abdominal pain, nausea and vomiting.  Neurological: Negative for dizziness and weakness.        Objective    BP 121/81 (BP Location: Left Arm, Patient Position: Sitting, Cuff Size: Large)   Pulse 82  Temp 98.9 F (37.2 C) (Oral)   Resp 16   Wt 213 lb (96.6 kg)   SpO2 96%   BMI 34.38 kg/m  BP Readings from Last 3 Encounters:  06/28/20 121/81  03/01/20 132/76  01/19/20 (!) 168/87   Wt Readings from Last 3 Encounters:  06/28/20 213 lb (96.6 kg)  06/07/20 204 lb (92.5 kg)  03/01/20 212 lb (96.2 kg)       Physical Exam Vitals reviewed.  Constitutional:      General: She is not in  acute distress.    Appearance: Normal appearance.  HENT:     Head: Normocephalic.  Pulmonary:     Effort: Pulmonary effort is normal. No respiratory distress.  Neurological:     Mental Status: She is alert and oriented to person, place, and time. Mental status is at baseline.  Psychiatric:        Mood and Affect: Mood normal.        Behavior: Behavior normal.        Thought Content: Thought content normal.        Judgment: Judgment normal.     ECG reveals normal sinus rhythm with a rate of about 75.  No results found for any visits on 06/28/20.  Assessment & Plan     1. Tachycardia Is probably related to anxiety.  May need cardiology referral but I think this will be fine. - EKG 12-Lead  2. Seasonal allergic rhinitis due to pollen This time try ProAir in addition to Claritin.  Use sinus rinses and refer to ENT.  Also prescribed prednisone 20 mg daily.  3. Moderate asthma without complication, unspecified whether persistent ProAir as needed and prednisone 20 mg daily for 5 days.  4. GAD (generalized anxiety disorder) Needs GAD-7 and PHQ-9.  Start sertraline 50 mg daily. Follow-up with Dr. B in 1 month. 5. Paroxysmal supraventricular tachycardia (HCC) Correct hypokalemia with potassium restarted.   Return in about 1 month (around 07/28/2020).      I, Angela Durie, MD, have reviewed all documentation for this visit. The documentation on 07/07/20 for the exam, diagnosis, procedures, and orders are all accurate and complete.    Alyla Pietila Cranford Mon, MD  [facility] [phone] (phone) [phone] (fax)  [place]

## 2020-07-12 ENCOUNTER — Other Ambulatory Visit: Payer: Self-pay | Admitting: Family Medicine

## 2020-07-12 DIAGNOSIS — I10 Essential (primary) hypertension: Secondary | ICD-10-CM

## 2020-08-09 ENCOUNTER — Ambulatory Visit (INDEPENDENT_AMBULATORY_CARE_PROVIDER_SITE_OTHER): Payer: BC Managed Care – PPO | Admitting: Family Medicine

## 2020-08-09 ENCOUNTER — Other Ambulatory Visit: Payer: Self-pay

## 2020-08-09 ENCOUNTER — Encounter: Payer: Self-pay | Admitting: Family Medicine

## 2020-08-09 VITALS — BP 104/68 | HR 84 | Temp 98.6°F | Resp 16 | Ht 66.0 in | Wt 209.0 lb

## 2020-08-09 DIAGNOSIS — E039 Hypothyroidism, unspecified: Secondary | ICD-10-CM

## 2020-08-09 DIAGNOSIS — I10 Essential (primary) hypertension: Secondary | ICD-10-CM

## 2020-08-09 DIAGNOSIS — F419 Anxiety disorder, unspecified: Secondary | ICD-10-CM | POA: Diagnosis not present

## 2020-08-09 DIAGNOSIS — E876 Hypokalemia: Secondary | ICD-10-CM | POA: Diagnosis not present

## 2020-08-09 DIAGNOSIS — R7303 Prediabetes: Secondary | ICD-10-CM

## 2020-08-09 MED ORDER — SYNTHROID 125 MCG PO TABS: 125.0000 ug | ORAL_TABLET | Freq: Every day | ORAL | 3 refills | Status: DC

## 2020-08-09 NOTE — Assessment & Plan Note (Signed)
Well-controlled Has improved significantly since starting Zoloft No further panic symptoms, including palpitations/tachycardia Continue Zoloft at current dose At follow-up in 6 months, repeat PHQ-9 and GAD-7 and consider tapering and discontinuing if she is doing well and her life stressors have improved

## 2020-08-09 NOTE — Assessment & Plan Note (Signed)
Well controlled Slightly low, but asymptomatic Continue current medications Recheck metabolic panel F/u in 6 months

## 2020-08-09 NOTE — Assessment & Plan Note (Signed)
Has resumed her potassium supplement Recheck metabolic panel

## 2020-08-09 NOTE — Progress Notes (Signed)
Established patient visit   Patient: Angela Villarreal   DOB: 29-May-1955   65 y.o. Female  MRN: [id number] Visit Date: 08/09/2020  Today's healthcare provider: [name], MD   Chief Complaint  Patient presents with  . Anxiety   Subjective    Anxiety Patient reports no chest pain, dizziness, nausea, palpitations or shortness of breath.    Stressor  Her  boyfriends son has autism and cognitive disabilities. They noticed self inflicted injury and then their was a decision to move him in with them. She reports it was just overwhelming for about 8wks until they placed him in a care facility. Another stressor she has is her dog has an terminal illness and she worries when she has to leave him.  Blood Pressure She reports her BP was in the 100 through 108 the entire night but she felt like her heart was racing and a sense of panic. When she got up the next morning she decided to not drive herself to the beach. She says that the next day she still felt the same and it eventually leveled out. She made an appointment with Dr. Rosanna Randy and she received an EKG.   Zoloft She was prescribed Zoloft to . She reports for the first few weeks she was experiencing extreme fatigue. She began taking it before bed and she finds that her sleep has improved. She reports feeling better.  She is unsure if the exteneded stress is related to her recent experience of her BP fluctuating and feeling of panic. She reports that she hasn't experienced the strong sense of panic before.    Productive Cough She is currently using singulair, advair, flonase and albuterol when needed to help with her seasonal allergies. She is experiencing a productive cough.   Potassium  She wasn't taking potassium for a while but she has restarted taking her Potassium.   Refill She is requesting for a refill of synthroid to treat hypothyroidism.   Anxiety, Follow-up  She was last seen for anxiety 1 months  ago. Changes made at last visit include start Sertraline 50 mg daily.   She reports excellent compliance with treatment. She reports excellent tolerance of treatment. She is not having side effects.  She feels her anxiety is mild and Improved since last visit.  Symptoms: No chest pain No difficulty concentrating  No dizziness No fatigue  No feelings of losing control No insomnia  No irritable No palpitations  No panic attacks No racing thoughts  No shortness of breath No sweating  No tremors/shakes    GAD-7 Results GAD-7 Generalized Anxiety Disorder Screening Tool 08/09/2020  1. Feeling Nervous, Anxious, or on Edge 0  2. Not Being Able to Stop or Control Worrying 0  3. Worrying Too Much About Different Things 0  4. Trouble Relaxing 0  5. Being So Restless it's Hard To Sit Still 0  6. Becoming Easily Annoyed or Irritable 0  7. Feeling Afraid As If Something Awful Might Happen 0  Total GAD-7 Score 0  Difficulty At Work, Home, or Getting  Along With Others? Not difficult at all    PHQ-9 Scores PHQ9 SCORE ONLY 08/09/2020 03/01/2020 01/19/2020  PHQ-9 Total Score 1 0 0   Depression screen [facility] 2/9 08/09/2020 03/01/2020 01/19/2020 01/05/2019 11/03/2017  Decreased Interest 0 0 0 0 0  Down, Depressed, Hopeless 1 0 0 0 0  PHQ - 2 Score 1 0 0 0 0  Altered sleeping 0 0 0 1 0  Tired, decreased energy 0 0 0 0 1  Change in appetite 0 0 0 0 0  Feeling bad or failure about yourself  0 0 0 0 0  Trouble concentrating 0 0 0 0 0  Moving slowly or fidgety/restless 0 0 0 0 0  Suicidal thoughts 0 0 0 0 0  PHQ-9 Score 1 0 0 1 1  Difficult doing work/chores Not difficult at all Not difficult at all Not difficult at all - Not difficult at all    ---------------------------------------------------------------------------------------------------   Patient Active Problem List   Diagnosis Date Noted  . Anxiety 08/09/2020  . Sprain of anterior talofibular ligament of left ankle 07/14/2019  .  Prediabetes 07/14/2019  . Venous insufficiency of both lower extremities 01/05/2019  . History of adenomatous polyp of colon 12/01/2017  . Varicose veins of bilateral lower extremities with pain 09/04/2016  . History of malignant melanoma 09/04/2016  . IBS (irritable bowel syndrome) 09/04/2015  . Hypokalemia 03/05/2015  . Post-menopausal 11/22/2014  . Allergic rhinitis 08/17/2014  . Airway hyperreactivity 08/17/2014  . Family history of cardiomyopathy 08/17/2014  . Essential hypertension 08/17/2014  . Adult hypothyroidism 08/17/2014  . Obesity 08/17/2014  . Arthritis, degenerative 08/17/2014  . Cardiac murmur 07/18/2014  . Hyperlipemia, mixed 07/17/2014   Social History   Tobacco Use  . Smoking status: Never Smoker  . Smokeless tobacco: Never Used  Vaping Use  . Vaping Use: Never used  Substance Use Topics  . Alcohol use: Yes    Comment: once monthly  . Drug use: No   Allergies  Allergen Reactions  . Gentamicin Sulfate        Medications: Outpatient Medications Prior to Visit  Medication Sig  . albuterol (VENTOLIN HFA) 108 (90 Base) MCG/ACT inhaler Inhale 1-2 puffs into the lungs every 4 (four) hours as needed for wheezing or shortness of breath.  . fluticasone (FLONASE) 50 MCG/ACT nasal spray Place 2 sprays into both nostrils daily.  Marland Kitchen FLUTICASONE PROPIONATE, NASAL, NA Place into the nose.  . fluticasone-salmeterol (ADVAIR HFA) 115-21 MCG/ACT inhaler Inhale 2 puffs into the lungs 2 (two) times daily.  . hyoscyamine (LEVBID) 0.375 MG 12 hr tablet Take 1 tablet (0.375 mg total) by mouth every 12 (twelve) hours as needed. TAKE ONE TABLET (0.375 MG) BY MOUTH TWICE DAILY  . lisinopril (ZESTRIL) 20 MG tablet TAKE ONE TABLET BY MOUTH EVERY DAY  . loratadine (CLARITIN) 10 MG tablet Take 1 tablet (10 mg total) by mouth daily.  . metoprolol succinate (TOPROL-XL) 100 MG 24 hr tablet TAKE 1 TABLET BY MOUTH DAILY  . montelukast (SINGULAIR) 10 MG tablet Take 1 tablet (10 mg total)  by mouth at bedtime.  . naproxen sodium (ALEVE) 220 MG tablet Take 220 mg by mouth 2 (two) times daily as needed.  Marland Kitchen omeprazole (PRILOSEC) 40 MG capsule Take 40 mg by mouth daily.  . potassium chloride SA (KLOR-CON) 20 MEQ tablet TAKE ONE TABLET TWICE DAILY (Patient taking differently: Take 20 mEq by mouth once.)  . rosuvastatin (CRESTOR) 5 MG tablet Take 1 tablet (5 mg total) by mouth daily.  . sertraline (ZOLOFT) 50 MG tablet Take 1 tablet (50 mg total) by mouth daily.  Marland Kitchen triamterene-hydrochlorothiazide (MAXZIDE-25) 37.5-25 MG tablet TAKE 1 TABLET BY MOUTH DAILY  . [DISCONTINUED] SYNTHROID 125 MCG tablet TAKE 1 TABLET BY MOUTH DAILY  . [DISCONTINUED] predniSONE (DELTASONE) 20 MG tablet Take 1 tablet (20 mg total) by mouth daily with breakfast. (Patient not taking: Reported on 08/09/2020)   No  facility-administered medications prior to visit.    Review of Systems  Constitutional: Negative for chills, fatigue and fever.  HENT: Negative for ear pain, nosebleeds, rhinorrhea, sinus pressure, sinus pain and sore throat.   Eyes: Negative for pain.  Respiratory: Positive for cough. Negative for chest tightness, shortness of breath and wheezing.   Cardiovascular: Negative for chest pain, palpitations and leg swelling.  Gastrointestinal: Negative for abdominal pain, blood in stool, constipation, diarrhea, nausea and vomiting.  Genitourinary: Negative for dysuria, flank pain, frequency, pelvic pain and urgency.  Musculoskeletal: Negative for back pain, neck pain and neck stiffness.  Allergic/Immunologic: Positive for environmental allergies.  Neurological: Negative for dizziness, syncope, weakness, light-headedness, numbness and headaches.       Objective    BP 104/68 (BP Location: Right Arm, Patient Position: Sitting, Cuff Size: Large)   Pulse 84   Temp 98.6 F (37 C) (Oral)   Resp 16   Ht 5\' 6"  (1.676 m)   Wt 209 lb (94.8 kg)   SpO2 98%   BMI 33.73 kg/m  BP Readings from Last 3  Encounters:  08/09/20 104/68  06/28/20 121/81  03/01/20 132/76   Wt Readings from Last 3 Encounters:  08/09/20 209 lb (94.8 kg)  06/28/20 213 lb (96.6 kg)  06/07/20 204 lb (92.5 kg)       Physical Exam Vitals reviewed.  Constitutional:      General: She is not in acute distress.    Appearance: Normal appearance. She is well-developed. She is not diaphoretic.  HENT:     Head: Normocephalic and atraumatic.  Eyes:     General: No scleral icterus.    Conjunctiva/sclera: Conjunctivae normal.  Neck:     Thyroid: No thyromegaly.  Cardiovascular:     Rate and Rhythm: Normal rate and regular rhythm.     Pulses: Normal pulses.     Heart sounds: Normal heart sounds. No murmur heard.   Pulmonary:     Effort: Pulmonary effort is normal. No respiratory distress.     Breath sounds: Normal breath sounds. No wheezing, rhonchi or rales.  Musculoskeletal:     Cervical back: Neck supple.     Right lower leg: No edema.     Left lower leg: No edema.  Lymphadenopathy:     Cervical: No cervical adenopathy.  Skin:    General: Skin is warm and dry.     Findings: No rash.  Neurological:     Mental Status: She is alert and oriented to person, place, and time. Mental status is at baseline.  Psychiatric:        Mood and Affect: Mood normal.        Behavior: Behavior normal.       No results found for any visits on 08/09/20.  Assessment & Plan     Problem List Items Addressed This Visit      Cardiovascular and Mediastinum   Essential hypertension    Well controlled Slightly low, but asymptomatic Continue current medications Recheck metabolic panel F/u in 6 months       Relevant Orders   Basic Metabolic Panel (BMET)     Endocrine   Adult hypothyroidism    Previously well controlled Continue Synthroid at current dose  Recheck TSH and adjust Synthroid as indicated        Relevant Medications   SYNTHROID 125 MCG tablet   Other Relevant Orders   TSH     Other    Hypokalemia    Has resumed her potassium  supplement Recheck metabolic panel      Relevant Orders   Basic Metabolic Panel (BMET)   Prediabetes    Recommend low carb diet Recheck A1c       Relevant Orders   Hemoglobin A1c   Anxiety - Primary    Well-controlled Has improved significantly since starting Zoloft No further panic symptoms, including palpitations/tachycardia Continue Zoloft at current dose At follow-up in 6 months, repeat PHQ-9 and GAD-7 and consider tapering and discontinuing if she is doing well and her life stressors have improved       Other Visit Diagnoses    Hypothyroidism       Relevant Medications   SYNTHROID 125 MCG tablet       Return in about 6 months (around 02/09/2021) for chronic disease f/u.       I,Essence Turner,acting as a Education administrator for [name], MD.,have documented all relevant documentation on the behalf of [name], MD,as directed by  [name], MD while in the presence of [name], MD.  I, [name], MD, have reviewed all documentation for this visit. The documentation on 08/09/20 for the exam, diagnosis, procedures, and orders are all accurate and complete.   Inita Uram, Dionne Bucy, MD, MPH [facility]

## 2020-08-09 NOTE — Assessment & Plan Note (Signed)
Recommend low carb diet °Recheck A1c  °

## 2020-08-09 NOTE — Patient Instructions (Signed)

## 2020-08-09 NOTE — Assessment & Plan Note (Signed)
Previously well controlled Continue Synthroid at current dose  Recheck TSH and adjust Synthroid as indicated   

## 2020-08-10 ENCOUNTER — Telehealth: Payer: Self-pay

## 2020-08-10 DIAGNOSIS — E039 Hypothyroidism, unspecified: Secondary | ICD-10-CM

## 2020-08-10 LAB — HEMOGLOBIN A1C
Est. average glucose Bld gHb Est-mCnc: 143 mg/dL
Hgb A1c MFr Bld: 6.6 % — ABNORMAL HIGH (ref 4.8–5.6)

## 2020-08-10 LAB — BASIC METABOLIC PANEL
BUN/Creatinine Ratio: 19 (ref 12–28)
BUN: 19 mg/dL (ref 8–27)
CO2: 21 mmol/L (ref 20–29)
Calcium: 9.2 mg/dL (ref 8.7–10.3)
Chloride: 103 mmol/L (ref 96–106)
Creatinine, Ser: 1.01 mg/dL — ABNORMAL HIGH (ref 0.57–1.00)
Glucose: 103 mg/dL — ABNORMAL HIGH (ref 65–99)
Potassium: 4.1 mmol/L (ref 3.5–5.2)
Sodium: 141 mmol/L (ref 134–144)
eGFR: 62 mL/min/{1.73_m2} (ref >59)

## 2020-08-10 LAB — TSH: TSH: 0.181 u[IU]/mL — ABNORMAL LOW (ref 0.450–4.500)

## 2020-08-10 MED ORDER — LEVOTHYROXINE SODIUM 112 MCG PO TABS: 112.0000 ug | ORAL_TABLET | Freq: Every day | ORAL | 0 refills | Status: DC

## 2020-08-10 NOTE — Telephone Encounter (Signed)
Prescription for Synthroid 112 mcg sent in to [facility].

## 2020-08-14 ENCOUNTER — Other Ambulatory Visit: Payer: Self-pay | Admitting: Family Medicine

## 2020-08-14 DIAGNOSIS — I10 Essential (primary) hypertension: Secondary | ICD-10-CM

## 2020-09-03 ENCOUNTER — Other Ambulatory Visit: Payer: Self-pay

## 2020-09-03 ENCOUNTER — Telehealth: Payer: BC Managed Care – PPO | Admitting: Family Medicine

## 2020-09-03 ENCOUNTER — Encounter: Payer: Self-pay | Admitting: Family Medicine

## 2020-09-03 VITALS — Temp 98.6°F

## 2020-09-03 DIAGNOSIS — J069 Acute upper respiratory infection, unspecified: Secondary | ICD-10-CM | POA: Diagnosis not present

## 2020-09-03 MED ORDER — HYDROCOD POLST-CPM POLST ER 10-8 MG/5ML PO SUER: 5.0000 mL | Freq: Two times a day (BID) | ORAL | 0 refills | Status: DC | PRN

## 2020-09-03 MED ORDER — BENZONATATE 200 MG PO CAPS: 200.0000 mg | ORAL_CAPSULE | Freq: Two times a day (BID) | ORAL | 0 refills | Status: DC | PRN

## 2020-09-03 MED ORDER — PREDNISONE 50 MG PO TABS: 50.0000 mg | ORAL_TABLET | Freq: Every day | ORAL | 0 refills | Status: DC

## 2020-09-03 NOTE — Progress Notes (Signed)
Temp 98.6 F (37 C)    Subjective:    Patient ID: Angela Villarreal, female    DOB: [birth year], 65 y.o.   MRN: [id number]  HPI: Angela Villarreal is a 65 y.o. female seen today for [facility] due to lack of staffing.   Chief Complaint  Patient presents with   Cough    Patient states she is having lots of cough and mucus. States she is coughing up brownish/ yellow mucus. Patient states she has been sick for about a week. Patient states she is having a sore throat and ear pain. Patient took at home covid test last night and it was negative. Patient states she is taking OTC mucinex and delsym.    UPPER RESPIRATORY TRACT INFECTION Duration: 1 week Worst symptom:cough at night Fever: no Cough: yes Shortness of breath: no Wheezing: no Chest pain: yes, with cough Chest tightness: no Chest congestion: no Nasal congestion: yes Runny nose: yes Post nasal drip: yes Sneezing: no Sore throat: yes Swollen glands: yes Sinus pressure: yes Headache: yes Face pain: no Toothache: no Ear pain: no  Ear pressure: yes  Eyes red/itching:yes Eye drainage/crusting: yes  Vomiting: no Rash: no Fatigue: yes Sick contacts: yes- grandson has cold Strep contacts: no  Context: worse Recurrent sinusitis: no Relief with OTC cold/cough medications: no  Treatments attempted: cold/sinus, mucinex, anti-histamine, and pseudoephedrine    Relevant past medical, surgical, family and social history reviewed and updated as indicated. Interim medical history since our last visit reviewed. Allergies and medications reviewed and updated.  Review of Systems  Constitutional:  Positive for fatigue. Negative for activity change, appetite change, chills, diaphoresis, fever and unexpected weight change.  HENT:  Positive for congestion, postnasal drip, rhinorrhea, sinus pressure and sore throat. Negative for dental problem, drooling, ear discharge, ear pain, facial swelling, hearing loss, mouth sores, nosebleeds, sinus  pain, sneezing, tinnitus, trouble swallowing and voice change.   Eyes: Negative.   Respiratory: Negative.    Cardiovascular: Negative.   Gastrointestinal: Negative.   Psychiatric/Behavioral: Negative.     Per HPI unless specifically indicated above     Objective:    Temp 98.6 F (37 C)   Wt Readings from Last 3 Encounters:  08/09/20 209 lb (94.8 kg)  06/28/20 213 lb (96.6 kg)  06/07/20 204 lb (92.5 kg)    Physical Exam Vitals and nursing note reviewed.  Constitutional:      General: She is not in acute distress.    Appearance: Normal appearance. She is not ill-appearing, toxic-appearing or diaphoretic.  HENT:     Head: Normocephalic and atraumatic.     Right Ear: External ear normal.     Left Ear: External ear normal.     Nose: Nose normal.     Mouth/Throat:     Mouth: Mucous membranes are moist.     Pharynx: Oropharynx is clear.  Eyes:     General: No scleral icterus.       Right eye: No discharge.        Left eye: No discharge.     Conjunctiva/sclera: Conjunctivae normal.     Pupils: Pupils are equal, round, and reactive to light.  Pulmonary:     Effort: Pulmonary effort is normal. No respiratory distress.     Comments: Speaking in full sentences Musculoskeletal:        General: Normal range of motion.     Cervical back: Normal range of motion.  Skin:    Coloration: Skin is not jaundiced or  pale.     Findings: No bruising, erythema, lesion or rash.  Neurological:     Mental Status: She is alert and oriented to person, place, and time. Mental status is at baseline.  Psychiatric:        Mood and Affect: Mood normal.        Behavior: Behavior normal.        Thought Content: Thought content normal.        Judgment: Judgment normal.    Results for orders placed or performed in visit on 13/08/65  Basic Metabolic Panel (BMET)  Result Value Ref Range   Glucose 103 (H) 65 - 99 mg/dL   BUN 19 8 - 27 mg/dL   Creatinine, Ser 1.01 (H) 0.57 - 1.00 mg/dL   eGFR 62 >59  mL/min/1.73   BUN/Creatinine Ratio 19 12 - 28   Sodium 141 134 - 144 mmol/L   Potassium 4.1 3.5 - 5.2 mmol/L   Chloride 103 96 - 106 mmol/L   CO2 21 20 - 29 mmol/L   Calcium 9.2 8.7 - 10.3 mg/dL  TSH  Result Value Ref Range   TSH 0.181 (L) 0.450 - 4.500 uIU/mL  Hemoglobin A1c  Result Value Ref Range   Hgb A1c MFr Bld 6.6 (H) 4.8 - 5.6 %   Est. average glucose Bld gHb Est-mCnc 143 mg/dL      Assessment & Plan:   Problem List Items Addressed This Visit   None Visit Diagnoses     Viral upper respiratory tract infection    -  Primary   Will treat with prednisone and tussionex and tessalon. Call if not getting better or getting worse. If not better by Thursday will send abx.         Follow up plan: Return if symptoms worsen or fail to improve.   This visit was completed via telephone due to the restrictions of the COVID-19 pandemic. All issues as above were discussed and addressed but no physical exam was performed. If it was felt that the patient should be evaluated in the office, they were directed there. The patient verbally consented to this visit. Patient was unable to complete an audio/visual visit due to Lack of equipment. Due to the catastrophic nature of the COVID-19 pandemic, this visit was done through audio contact only. Location of the patient: home Location of the provider: work Those involved with this call:  Provider: Park Liter, DO CMA: Louanna Raw, [place] Desk/Registration: Jill Side  Time spent on call:  15 minutes with patient face to face via video conference. More than 50% of this time was spent in counseling and coordination of care. 23 minutes total spent in review of patient's record and preparation of their chart.

## 2020-09-05 ENCOUNTER — Encounter: Payer: Self-pay | Admitting: Family Medicine

## 2020-09-05 NOTE — Telephone Encounter (Signed)
Bfp pt seen virtually 6/20. Please advise  Copied from [place] [id number]. Topic: General - Other >> Sep 05, 2020  1:44 PM Keene Breath wrote: Reason for CRM: Patient called to inform the doctor at [facility], Dr. Wynetta Emery, that she is worse and would like to get an antibiotic called in.  She was seen by Dr. Wynetta Emery on 6/20.  Please advise.

## 2020-09-06 ENCOUNTER — Telehealth: Payer: Self-pay

## 2020-09-06 MED ORDER — DOXYCYCLINE HYCLATE 100 MG PO TABS: 100.0000 mg | ORAL_TABLET | Freq: Two times a day (BID) | ORAL | 0 refills | Status: DC

## 2020-09-06 NOTE — Telephone Encounter (Signed)
Please advise 

## 2020-09-06 NOTE — Telephone Encounter (Signed)
Rx sent to her pharmacy 

## 2020-09-06 NOTE — Telephone Encounter (Signed)
Called pt advised medicine has been sent

## 2020-09-06 NOTE — Telephone Encounter (Signed)
Copied from [place] [id number]. Topic: General - Other >> Sep 05, 2020  1:44 PM Keene Breath wrote: Reason for CRM: Patient called to inform the doctor at [facility], Dr. Wynetta Emery, that she is worse and would like to get an antibiotic called in.  She was seen by Dr. Wynetta Emery on 6/20.  Please advise. >> Sep 06, 2020  8:42 AM Alanda Slim E wrote: Pt called to follow up and asked if someone cn give her a call today / please advise

## 2020-09-06 NOTE — Telephone Encounter (Signed)
Pt is calling back again and had virtual appt with dr Wynetta Emery on 09-03-2020. Pt is no better and would like abx [facility] to [facility] [address] in [place] phone number [phone] and pt would like a callback

## 2020-09-10 ENCOUNTER — Encounter: Payer: BC Managed Care – PPO | Admitting: Dermatology

## 2020-09-18 ENCOUNTER — Ambulatory Visit
Admission: RE | Admit: 2020-09-18 | Discharge: 2020-09-18 | Disposition: A | Discharge status: Home / Self Care | Payer: BC Managed Care – PPO | Attending: Unknown Physician Specialty | Admitting: Unknown Physician Specialty

## 2020-09-18 ENCOUNTER — Other Ambulatory Visit: Payer: Self-pay | Admitting: Unknown Physician Specialty

## 2020-09-18 ENCOUNTER — Ambulatory Visit
Admission: RE | Admit: 2020-09-18 | Discharge: 2020-09-18 | Disposition: A | Discharge status: Home / Self Care | Payer: BC Managed Care – PPO | Source: Ambulatory Visit | Attending: Unknown Physician Specialty | Admitting: Unknown Physician Specialty

## 2020-09-18 DIAGNOSIS — R053 Chronic cough: Secondary | ICD-10-CM

## 2020-10-01 ENCOUNTER — Ambulatory Visit: Payer: BC Managed Care – PPO | Admitting: Dermatology

## 2020-10-01 ENCOUNTER — Other Ambulatory Visit: Payer: Self-pay

## 2020-10-01 DIAGNOSIS — Z1283 Encounter for screening for malignant neoplasm of skin: Secondary | ICD-10-CM

## 2020-10-01 DIAGNOSIS — D229 Melanocytic nevi, unspecified: Secondary | ICD-10-CM

## 2020-10-01 DIAGNOSIS — L814 Other melanin hyperpigmentation: Secondary | ICD-10-CM | POA: Diagnosis not present

## 2020-10-01 DIAGNOSIS — Z85820 Personal history of malignant melanoma of skin: Secondary | ICD-10-CM

## 2020-10-01 DIAGNOSIS — L821 Other seborrheic keratosis: Secondary | ICD-10-CM

## 2020-10-01 DIAGNOSIS — I8310 Varicose veins of unspecified lower extremity with inflammation: Secondary | ICD-10-CM

## 2020-10-01 DIAGNOSIS — L578 Other skin changes due to chronic exposure to nonionizing radiation: Secondary | ICD-10-CM | POA: Diagnosis not present

## 2020-10-01 DIAGNOSIS — D1800 Hemangioma unspecified site: Secondary | ICD-10-CM

## 2020-10-01 NOTE — Progress Notes (Signed)
   Follow-Up Visit   Subjective  Angela Villarreal is a 65 y.o. female who presents for the following: Annual Exam (History of Melanoma (2005) - TBSE). Patient here for full body skin exam and skin cancer screening.  The following portions of the chart were reviewed this encounter and updated as appropriate:  Tobacco  Allergies  Meds  Problems  Med Hx  Surg Hx  Fam Hx     Objective  Well appearing patient in no apparent distress; mood and affect are within normal limits.  A full examination was performed including scalp, head, eyes, ears, nose, lips, neck, chest, axillae, abdomen, back, buttocks, bilateral upper extremities, bilateral lower extremities, hands, feet, fingers, toes, fingernails, and toenails. All findings within normal limits unless otherwise noted below.   Assessment & Plan  Skin cancer screening  Lentigines - Scattered tan macules - Due to sun exposure - Benign-appering, observe - Recommend daily broad spectrum sunscreen SPF 30+ to sun-exposed areas, reapply every 2 hours as needed. - Call for any changes  Varicose Veins - Dilated blue, purple or red veins at the lower extremities - Reassured - These can be treated by sclerotherapy (a procedure to inject a medicine into the veins to make them disappear) if desired, but the treatment is not covered by insurance  Seborrheic Keratoses - Stuck-on, waxy, tan-brown papules and/or plaques  - Benign-appearing - Discussed benign etiology and prognosis. - Observe - Call for any changes  Melanocytic Nevi - Tan-brown and/or pink-flesh-colored symmetric macules and papules - Benign appearing on exam today - Observation - Call clinic for new or changing moles - Recommend daily use of broad spectrum spf 30+ sunscreen to sun-exposed areas.   Hemangiomas - Red papules - Discussed benign nature - Observe - Call for any changes  Actinic Damage - Chronic condition, secondary to cumulative UV/sun exposure -  diffuse scaly erythematous macules with underlying dyspigmentation - Recommend daily broad spectrum sunscreen SPF 30+ to sun-exposed areas, reapply every 2 hours as needed.  - Staying in the shade or wearing long sleeves, sun glasses (UVA+UVB protection) and wide brim hats (4-inch brim around the entire circumference of the hat) are also recommended for sun protection.  - Call for new or changing lesions.  History of Melanoma - No evidence of recurrence today at left cheek  - No lymphadenopathy - Recommend regular full body skin exams - Recommend daily broad spectrum sunscreen SPF 30+ to sun-exposed areas, reapply every 2 hours as needed.  - Call if any new or changing lesions are noted between office visits  Skin cancer screening performed today.  Return in about 1 year (around 10/01/2021) for tbse . IRuthell Rummage, CMA, am acting as scribe for Sarina Ser, MD. Documentation: I have reviewed the above documentation for accuracy and completeness, and I agree with the above.  Sarina Ser, MD

## 2020-10-01 NOTE — Patient Instructions (Signed)
Melanoma ABCDEs  Melanoma is the most dangerous type of skin cancer, and is the leading cause of death from skin disease.  You are more likely to develop melanoma if you: Have light-colored skin, light-colored eyes, or red or blond hair Spend a lot of time in the sun Tan regularly, either outdoors or in a tanning bed Have had blistering sunburns, especially during childhood Have a close family member who has had a melanoma Have atypical moles or large birthmarks  Early detection of melanoma is key since treatment is typically straightforward and cure rates are extremely high if we catch it early.   The first sign of melanoma is often a change in a mole or a new dark spot.  The ABCDE system is a way of remembering the signs of melanoma.  A for asymmetry:  The two halves do not match. B for border:  The edges of the growth are irregular. C for color:  A mixture of colors are present instead of an even brown color. D for diameter:  Melanomas are usually (but not always) greater than 16mm - the size of a pencil eraser. E for evolution:  The spot keeps changing in size, shape, and color.  Please check your skin once per month between visits. You can use a small mirror in front and a large mirror behind you to keep an eye on the back side or your body.   If you see any new or changing lesions before your next follow-up, please call to schedule a visit.  Please continue daily skin protection including broad spectrum sunscreen SPF 30+ to sun-exposed areas, reapplying every 2 hours as needed when you're outdoors.   Staying in the shade or wearing long sleeves, sun glasses (UVA+UVB protection) and wide brim hats (4-inch brim around the entire circumference of the hat) are also recommended for sun protection.    Seborrheic Keratosis  What causes seborrheic keratoses? Seborrheic keratoses are harmless, common skin growths that first appear during adult life.  As time goes by, more growths appear.   Some people may develop a large number of them.  Seborrheic keratoses appear on both covered and uncovered body parts.  They are not caused by sunlight.  The tendency to develop seborrheic keratoses can be inherited.  They vary in color from skin-colored to gray, brown, or even black.  They can be either smooth or have a rough, warty surface.   Seborrheic keratoses are superficial and look as if they were stuck on the skin.  Under the microscope this type of keratosis looks like layers upon layers of skin.  That is why at times the top layer may seem to fall off, but the rest of the growth remains and re-grows.    Treatment Seborrheic keratoses do not need to be treated, but can easily be removed in the office.  Seborrheic keratoses often cause symptoms when they rub on clothing or jewelry.  Lesions can be in the way of shaving.  If they become inflamed, they can cause itching, soreness, or burning.  Removal of a seborrheic keratosis can be accomplished by freezing, burning, or surgery. If any spot bleeds, scabs, or grows rapidly, please return to have it checked, as these can be an indication of a skin cancer.

## 2020-10-02 ENCOUNTER — Encounter: Payer: Self-pay | Admitting: Dermatology

## 2020-10-11 ENCOUNTER — Other Ambulatory Visit: Payer: Self-pay | Admitting: Family Medicine

## 2020-11-02 ENCOUNTER — Other Ambulatory Visit: Payer: Self-pay

## 2020-11-02 ENCOUNTER — Encounter: Payer: Self-pay | Admitting: Family Medicine

## 2020-11-02 ENCOUNTER — Ambulatory Visit: Payer: BC Managed Care – PPO | Admitting: Family Medicine

## 2020-11-02 VITALS — BP 131/82 | HR 77 | Temp 98.6°F | Resp 16 | Ht 66.0 in | Wt 204.0 lb

## 2020-11-02 DIAGNOSIS — I10 Essential (primary) hypertension: Secondary | ICD-10-CM

## 2020-11-02 DIAGNOSIS — Z6832 Body mass index (BMI) 32.0-32.9, adult: Secondary | ICD-10-CM

## 2020-11-02 DIAGNOSIS — E782 Mixed hyperlipidemia: Secondary | ICD-10-CM | POA: Diagnosis not present

## 2020-11-02 DIAGNOSIS — R7303 Prediabetes: Secondary | ICD-10-CM | POA: Diagnosis not present

## 2020-11-02 DIAGNOSIS — J45909 Unspecified asthma, uncomplicated: Secondary | ICD-10-CM

## 2020-11-02 DIAGNOSIS — E039 Hypothyroidism, unspecified: Secondary | ICD-10-CM

## 2020-11-02 DIAGNOSIS — E669 Obesity, unspecified: Secondary | ICD-10-CM

## 2020-11-02 LAB — POCT GLYCOSYLATED HEMOGLOBIN (HGB A1C): Hemoglobin A1C: 6.3 % — AB (ref 4.0–5.6)

## 2020-11-02 MED ORDER — DILTIAZEM HCL ER COATED BEADS 120 MG PO CP24: 120.0000 mg | ORAL_CAPSULE | Freq: Every day | ORAL | 5 refills | Status: DC

## 2020-11-02 NOTE — Assessment & Plan Note (Signed)
a1c stable Encourage low carb diet

## 2020-11-02 NOTE — Assessment & Plan Note (Signed)
Discussed importance of healthy weight management Discussed diet and exercise  

## 2020-11-02 NOTE — Progress Notes (Signed)
Established patient visit   Patient: Angela Villarreal   DOB: 12-Nov-1955   65 y.o. Female  MRN: [id number] Visit Date: 11/02/2020  Today's healthcare provider: [name], MD   Chief Complaint  Patient presents with   Hypertension   Hypothyroidism   Hyperglycemia   Subjective  Hypertension Pertinent negatives include no chest pain, headaches, neck pain, palpitations or shortness of breath.  Hyperglycemia Pertinent negatives include no abdominal pain, chest pain, chills, coughing, fatigue, fever, headaches, myalgias, nausea, neck pain, numbness, sore throat, vomiting or weakness.   Hypertension, follow-up  BP Readings from Last 3 Encounters:  11/02/20 131/82  08/09/20 104/68  06/28/20 121/81   Wt Readings from Last 3 Encounters:  11/02/20 204 lb (92.5 kg)  08/09/20 209 lb (94.8 kg)  06/28/20 213 lb (96.6 kg)     She was last seen for hypertension 3 months ago.   BP at that visit was 104/68. Management since that visit includes no medication changes.  She has consulted her pulmonologist who referred her to an allergist for the cough to confirm the cough wasn't associated with an allergy. She has a scheduled appointment with Dr. Lanney Gins in October.   She is no longer taking the lisinopril that can cause a cough due to the ACE component.   She continues to take metoprolol and maxzide. It was discussed to remove metoprolol and change to an alternative medication. She has taken amlodipine in the past but it caused swelling.   She reports good compliance with treatment. She is not having side effects.  She is following a Regular diet. She is not exercising. She does not smoke.  Use of agents associated with hypertension: none.   Outside blood pressures aren't being checked. Symptoms: No chest pain No chest pressure  No palpitations No syncope  No dyspnea No orthopnea  No paroxysmal nocturnal dyspnea No lower extremity edema   Pertinent labs: Lab  Results  Component Value Date   CHOL 211 (H) 01/19/2020   HDL 43 01/19/2020   LDLCALC 146 (H) 01/19/2020   TRIG 121 01/19/2020   CHOLHDL 4.6 (H) 01/07/2019   Lab Results  Component Value Date   NA 141 08/09/2020   K 4.1 08/09/2020   CREATININE 1.01 (H) 08/09/2020   GFRNONAA 61 03/23/2020   GFRAA 71 03/23/2020   GLUCOSE 103 (H) 08/09/2020     The 10-year ASCVD risk score Mikey Bussing DC Jr., et al., 2013) is: 8.1%   Hypothyroid, follow-up  Lab Results  Component Value Date   TSH 0.181 (L) 08/09/2020   TSH 1.080 01/19/2020   TSH 0.578 07/14/2019   T4TOTAL 8.4 09/04/2016   Wt Readings from Last 3 Encounters:  11/02/20 204 lb (92.5 kg)  08/09/20 209 lb (94.8 kg)  06/28/20 213 lb (96.6 kg)    She was last seen for hypothyroid 3 months ago.   Management since that visit includes decreasing levothyroxine to 160mg.  She reports good compliance with treatment. She is not having side effects.   Symptoms: No change in energy level No constipation  No diarrhea No heat / cold intolerance  No nervousness No palpitations  No weight changes     Prediabetes, Follow-up  Lab Results  Component Value Date   HGBA1C 6.3 (A) 11/02/2020   HGBA1C 6.6 (H) 08/09/2020   HGBA1C 6.2 (H) 01/19/2020   GLUCOSE 103 (H) 08/09/2020   GLUCOSE 119 (H) 03/23/2020   GLUCOSE 114 (H) 03/01/2020    Last seen for for  this 3 months ago.  Management since that visit includes . Current symptoms include none and have been stable.  Prior visit with dietician: no She continues to make lifestyle changes.  Current diet: well balanced Current exercise: walking  Pertinent Labs:    Component Value Date/Time   CHOL 211 (H) 01/19/2020 1006   TRIG 121 01/19/2020 1006   CHOLHDL 4.6 (H) 01/07/2019 0856   CREATININE 1.01 (H) 08/09/2020 1606    Wt Readings from Last 3 Encounters:  11/02/20 204 lb (92.5 kg)  08/09/20 209 lb (94.8 kg)  06/28/20 213 lb (96.6 kg)       Medications: Outpatient  Medications Prior to Visit  Medication Sig   albuterol (VENTOLIN HFA) 108 (90 Base) MCG/ACT inhaler Inhale 1-2 puffs into the lungs every 4 (four) hours as needed for wheezing or shortness of breath.   fluticasone (FLONASE) 50 MCG/ACT nasal spray Place 2 sprays into both nostrils daily.   FLUTICASONE PROPIONATE, NASAL, NA Place into the nose.   fluticasone-salmeterol (ADVAIR HFA) 115-21 MCG/ACT inhaler Inhale 2 puffs into the lungs 2 (two) times daily.   hyoscyamine (LEVBID) 0.375 MG 12 hr tablet Take 1 tablet (0.375 mg total) by mouth every 12 (twelve) hours as needed. TAKE ONE TABLET (0.375 MG) BY MOUTH TWICE DAILY   loratadine (CLARITIN) 10 MG tablet Take 1 tablet (10 mg total) by mouth daily.   montelukast (SINGULAIR) 10 MG tablet Take 1 tablet (10 mg total) by mouth at bedtime.   naproxen sodium (ALEVE) 220 MG tablet Take 220 mg by mouth 2 (two) times daily as needed.   omeprazole (PRILOSEC) 40 MG capsule Take 40 mg by mouth daily.   potassium chloride SA (KLOR-CON) 20 MEQ tablet TAKE ONE TABLET TWICE DAILY (Patient taking differently: Take 20 mEq by mouth once.)   rosuvastatin (CRESTOR) 5 MG tablet Take 1 tablet (5 mg total) by mouth daily.   sertraline (ZOLOFT) 50 MG tablet TAKE 1 TABLET BY MOUTH DAILY.   SYNTHROID 112 MCG tablet TAKE 1 TABLET EVERY DAY ON EMPTY STOMACHWITH A GLASS OF WATER AT LEAST 30-60 MINBEFORE BREAKFAST   triamterene-hydrochlorothiazide (MAXZIDE-25) 37.5-25 MG tablet TAKE 1 TABLET BY MOUTH DAILY   [DISCONTINUED] lisinopril (ZESTRIL) 20 MG tablet TAKE ONE TABLET BY MOUTH EVERY DAY   [DISCONTINUED] metoprolol succinate (TOPROL-XL) 100 MG 24 hr tablet TAKE 1 TABLET BY MOUTH DAILY   [DISCONTINUED] benzonatate (TESSALON) 200 MG capsule Take 1 capsule (200 mg total) by mouth 2 (two) times daily as needed for cough. (Patient not taking: Reported on 11/02/2020)   [DISCONTINUED] chlorpheniramine-HYDROcodone (TUSSIONEX PENNKINETIC ER) 10-8 MG/5ML SUER Take 5 mLs by mouth every 12  (twelve) hours as needed. (Patient not taking: Reported on 11/02/2020)   [DISCONTINUED] doxycycline (VIBRA-TABS) 100 MG tablet Take 1 tablet (100 mg total) by mouth 2 (two) times daily. (Patient not taking: Reported on 11/02/2020)   [DISCONTINUED] predniSONE (DELTASONE) 50 MG tablet Take 1 tablet (50 mg total) by mouth daily with breakfast. (Patient not taking: Reported on 11/02/2020)   No facility-administered medications prior to visit.    Review of Systems  Constitutional:  Negative for chills, fatigue and fever.  HENT:  Negative for ear pain, sinus pressure, sinus pain and sore throat.   Eyes:  Negative for pain and visual disturbance.  Respiratory:  Negative for cough, chest tightness, shortness of breath and wheezing.   Cardiovascular:  Negative for chest pain, palpitations and leg swelling.  Gastrointestinal:  Negative for abdominal pain, blood in stool, diarrhea, nausea and  vomiting.  Genitourinary:  Negative for flank pain, frequency, pelvic pain and urgency.  Musculoskeletal:  Negative for back pain, myalgias and neck pain.  Neurological:  Negative for dizziness, weakness, light-headedness, numbness and headaches.      Objective  -------------------------------------------------------------------------------------------------------------------- BP 131/82   Pulse 77   Temp 98.6 F (37 C)   Resp 16   Ht '5\' 6"'$  (1.676 m)   Wt 204 lb (92.5 kg)   BMI 32.93 kg/m     Physical Exam Vitals reviewed.  Constitutional:      General: She is not in acute distress.    Appearance: Normal appearance. She is well-developed. She is not diaphoretic.  HENT:     Head: Normocephalic and atraumatic.  Eyes:     General: No scleral icterus.    Conjunctiva/sclera: Conjunctivae normal.  Neck:     Thyroid: No thyromegaly.  Cardiovascular:     Rate and Rhythm: Normal rate and regular rhythm.     Pulses: Normal pulses.     Heart sounds: Normal heart sounds. No murmur heard. Pulmonary:      Effort: Pulmonary effort is normal. No respiratory distress.     Breath sounds: Normal breath sounds. No wheezing, rhonchi or rales.  Musculoskeletal:     Cervical back: Neck supple.     Right lower leg: No edema.     Left lower leg: No edema.  Lymphadenopathy:     Cervical: No cervical adenopathy.  Skin:    General: Skin is warm and dry.     Findings: No rash.  Neurological:     Mental Status: She is alert and oriented to person, place, and time. Mental status is at baseline.  Psychiatric:        Mood and Affect: Mood normal.        Behavior: Behavior normal.     Results for orders placed or performed in visit on 11/02/20  POCT glycosylated hemoglobin (Hb A1C)  Result Value Ref Range   Hemoglobin A1C 6.3 (A) 4.0 - 5.6 %   HbA1c POC (<> result, manual entry)     HbA1c, POC (prediabetic range)     HbA1c, POC (controlled diabetic range)      Assessment & Plan  ---------------------------------------------------------------------------------------------------------------------- Problem List Items Addressed This Visit       Cardiovascular and Mediastinum   Essential hypertension    Well controlled Lisinopril d/c'd due to cough Continue triamterene-hctz Stop metoprolol due to airway hypersensitivity Start low dose diltiazem Did not tolerate amlodipine in the past due to LE edema Reviewed recent CMP      Relevant Medications   diltiazem (CARDIZEM CD) 120 MG 24 hr capsule     Respiratory   Airway hyperreactivity    F/b Pulm Will d/c BB as above        Endocrine   Adult hypothyroidism    Previously with low A1c Continue Synthroid at current dose  Recheck TSH and adjust Synthroid as indicated        Relevant Orders   TSH     Other   Hyperlipemia, mixed    Previously well controlled Continue statin Repeat FLP and CMP      Relevant Medications   diltiazem (CARDIZEM CD) 120 MG 24 hr capsule   Other Relevant Orders   Lipid panel   Obesity    Discussed  importance of healthy weight management Discussed diet and exercise       Prediabetes - Primary    a1c stable Encourage low carb diet  Relevant Orders   POCT glycosylated hemoglobin (Hb A1C) (Completed)    Return in about 3 months (around 02/02/2021) for BP f/u, as scheduled.      I,Essence Turner,acting as a Education administrator for [name], MD.,have documented all relevant documentation on the behalf of [name], MD,as directed by  [name], MD while in the presence of [name], MD.  I, [name], MD, have reviewed all documentation for this visit. The documentation on 11/02/20 for the exam, diagnosis, procedures, and orders are all accurate and complete.   Donnajean Chesnut, Dionne Bucy, MD, MPH [facility]

## 2020-11-02 NOTE — Assessment & Plan Note (Signed)
F/b Pulm Will d/c BB as above

## 2020-11-02 NOTE — Assessment & Plan Note (Signed)
Well controlled Lisinopril d/c'd due to cough Continue triamterene-hctz Stop metoprolol due to airway hypersensitivity Start low dose diltiazem Did not tolerate amlodipine in the past due to LE edema Reviewed recent CMP

## 2020-11-02 NOTE — Assessment & Plan Note (Signed)
Previously well controlled Continue statin Repeat FLP and CMP  

## 2020-11-02 NOTE — Assessment & Plan Note (Addendum)
Previously with low A1c Continue Synthroid at current dose  Recheck TSH and adjust Synthroid as indicated

## 2020-11-03 LAB — LIPID PANEL
Chol/HDL Ratio: 3.4 ratio (ref 0.0–4.4)
Cholesterol, Total: 143 mg/dL (ref 100–199)
HDL: 42 mg/dL (ref >39)
LDL Chol Calc (NIH): 78 mg/dL (ref 0–99)
Triglycerides: 132 mg/dL (ref 0–149)
VLDL Cholesterol Cal: 23 mg/dL (ref 5–40)

## 2020-11-03 LAB — TSH: TSH: 0.322 u[IU]/mL — ABNORMAL LOW (ref 0.450–4.500)

## 2020-11-05 ENCOUNTER — Other Ambulatory Visit: Payer: Self-pay

## 2020-11-05 DIAGNOSIS — E039 Hypothyroidism, unspecified: Secondary | ICD-10-CM

## 2020-11-05 MED ORDER — LEVOTHYROXINE SODIUM 100 MCG PO TABS: 100.0000 ug | ORAL_TABLET | Freq: Every day | ORAL | 3 refills | Status: DC

## 2020-11-05 NOTE — Progress Notes (Signed)
Yeah just a lab check for TSH in 26m not appt

## 2020-11-08 ENCOUNTER — Telehealth: Payer: Self-pay | Admitting: Family Medicine

## 2020-11-09 ENCOUNTER — Ambulatory Visit: Payer: Self-pay | Admitting: Family Medicine

## 2020-11-13 ENCOUNTER — Other Ambulatory Visit: Payer: Self-pay | Admitting: Family Medicine

## 2020-11-13 DIAGNOSIS — I10 Essential (primary) hypertension: Secondary | ICD-10-CM

## 2020-11-14 NOTE — Telephone Encounter (Signed)
Requested Prescriptions  Pending Prescriptions Disp Refills  . rosuvastatin (CRESTOR) 5 MG tablet [Pharmacy Med Name: ROSUVASTATIN CALCIUM 5 MG TAB] 90 tablet 0    Sig: TAKE 1 TABLET BY MOUTH DAILY     Cardiovascular:  Antilipid - Statins Passed - 11/13/2020 10:35 AM      Passed - Total Cholesterol in normal range and within 360 days    Cholesterol, Total  Date Value Ref Range Status  11/02/2020 143 100 - 199 mg/dL Final         Passed - LDL in normal range and within 360 days    LDL Chol Calc (NIH)  Date Value Ref Range Status  11/02/2020 78 0 - 99 mg/dL Final         Passed - HDL in normal range and within 360 days    HDL  Date Value Ref Range Status  11/02/2020 42 >39 mg/dL Final         Passed - Triglycerides in normal range and within 360 days    Triglycerides  Date Value Ref Range Status  11/02/2020 132 0 - 149 mg/dL Final         Passed - Patient is not pregnant      Passed - Valid encounter within last 12 months    Recent Outpatient Visits          1 week ago Prediabetes   [facility] [place], Dionne Bucy, MD   2 months ago Viral upper respiratory tract infection   [place], [place], DO   3 months ago [place], Dionne Bucy, MD   4 months ago [place] Jerrol Banana., MD   5 months ago Seasonal allergic rhinitis due to pollen   [facility], Dionne Bucy, MD      Future Appointments            In 3 months [place], Dionne Bucy, MD [facility]           . triamterene-hydrochlorothiazide (MAXZIDE-25) 37.5-25 MG tablet [Pharmacy Med Name: TRIAMTERENE-HCTZ 37.5-25 MG TAB] 90 tablet 0    Sig: TAKE 1 TABLET BY MOUTH DAILY     Cardiovascular: Diuretic Combos Failed - 11/13/2020 10:35 AM      Failed - Cr in normal range and within 360 days    Creatinine, Ser  Date Value Ref Range Status  08/09/2020 1.01  (H) 0.57 - 1.00 mg/dL Final         Passed - K in normal range and within 360 days    Potassium  Date Value Ref Range Status  08/09/2020 4.1 3.5 - 5.2 mmol/L Final         Passed - Na in normal range and within 360 days    Sodium  Date Value Ref Range Status  08/09/2020 141 134 - 144 mmol/L Final         Passed - Ca in normal range and within 360 days    Calcium  Date Value Ref Range Status  08/09/2020 9.2 8.7 - 10.3 mg/dL Final         Passed - Last BP in normal range    BP Readings from Last 1 Encounters:  11/02/20 131/82         Passed - Valid encounter within last 6 months    Recent Outpatient Visits          1 week ago  Prediabetes   [facility] [place], Dionne Bucy, MD   2 months ago Viral upper respiratory tract infection   [place], [place], DO   3 months ago [place], Dionne Bucy, MD   4 months ago [place] Jerrol Banana., MD   5 months ago Seasonal allergic rhinitis due to pollen   [facility], Dionne Bucy, MD      Future Appointments            In 3 months Bacigalupo, Dionne Bucy, MD [facility] [place]

## 2020-12-07 ENCOUNTER — Other Ambulatory Visit: Payer: Self-pay | Admitting: Family Medicine

## 2020-12-07 DIAGNOSIS — E876 Hypokalemia: Secondary | ICD-10-CM

## 2021-01-30 DIAGNOSIS — M5412 Radiculopathy, cervical region: Secondary | ICD-10-CM | POA: Diagnosis not present

## 2021-01-30 DIAGNOSIS — M62830 Muscle spasm of back: Secondary | ICD-10-CM | POA: Diagnosis not present

## 2021-01-30 DIAGNOSIS — M9901 Segmental and somatic dysfunction of cervical region: Secondary | ICD-10-CM | POA: Diagnosis not present

## 2021-01-30 DIAGNOSIS — M25511 Pain in right shoulder: Secondary | ICD-10-CM | POA: Diagnosis not present

## 2021-02-12 DIAGNOSIS — E039 Hypothyroidism, unspecified: Secondary | ICD-10-CM | POA: Diagnosis not present

## 2021-02-13 LAB — TSH: TSH: 0.929 u[IU]/mL (ref 0.450–4.500)

## 2021-02-15 ENCOUNTER — Other Ambulatory Visit: Payer: Self-pay

## 2021-02-15 ENCOUNTER — Encounter: Payer: Self-pay | Admitting: Family Medicine

## 2021-02-15 ENCOUNTER — Ambulatory Visit (INDEPENDENT_AMBULATORY_CARE_PROVIDER_SITE_OTHER): Payer: Medicare PPO | Admitting: Family Medicine

## 2021-02-15 VITALS — BP 136/82 | HR 78 | Temp 97.7°F | Resp 16 | Wt 201.0 lb

## 2021-02-15 DIAGNOSIS — E782 Mixed hyperlipidemia: Secondary | ICD-10-CM | POA: Diagnosis not present

## 2021-02-15 DIAGNOSIS — Z86010 Personal history of colonic polyps: Secondary | ICD-10-CM | POA: Diagnosis not present

## 2021-02-15 DIAGNOSIS — E039 Hypothyroidism, unspecified: Secondary | ICD-10-CM | POA: Diagnosis not present

## 2021-02-15 DIAGNOSIS — E669 Obesity, unspecified: Secondary | ICD-10-CM

## 2021-02-15 DIAGNOSIS — Z6832 Body mass index (BMI) 32.0-32.9, adult: Secondary | ICD-10-CM

## 2021-02-15 DIAGNOSIS — F419 Anxiety disorder, unspecified: Secondary | ICD-10-CM | POA: Diagnosis not present

## 2021-02-15 DIAGNOSIS — I10 Essential (primary) hypertension: Secondary | ICD-10-CM

## 2021-02-15 DIAGNOSIS — R7303 Prediabetes: Secondary | ICD-10-CM

## 2021-02-15 NOTE — Assessment & Plan Note (Signed)
Discussed importance of healthy weight management Discussed diet and exercise  

## 2021-02-15 NOTE — Assessment & Plan Note (Signed)
Well controlled Continue rosuvastatin 5mg 

## 2021-02-15 NOTE — Assessment & Plan Note (Addendum)
Well controlled Tolerating medication changes well Continue current medications Recheck metabolic panel

## 2021-02-15 NOTE — Assessment & Plan Note (Signed)
Stable, last A1c 6.3 Continue diet control

## 2021-02-15 NOTE — Assessment & Plan Note (Signed)
TSH wnl Continue Synthroid 139mcg

## 2021-02-15 NOTE — Progress Notes (Signed)
Established patient visit   Patient: Angela Villarreal   DOB: 05-Oct-1955   65 y.o. Female  MRN: [id number] Visit Date: 02/15/2021  Today's healthcare provider: [name], MD   Chief Complaint  Patient presents with   Hyperlipidemia   Hypertension   Hypothyroidism   Subjective   Tolerating all medications well Started taking melatonin to aid with sleep    Hyperlipidemia Pertinent negatives include no chest pain or shortness of breath.  Hypertension Pertinent negatives include no chest pain, palpitations or shortness of breath.   Hypertension, follow-up  BP Readings from Last 3 Encounters:  02/15/21 136/82  11/02/20 131/82  08/09/20 104/68   Wt Readings from Last 3 Encounters:  02/15/21 201 lb (91.2 kg)  11/02/20 204 lb (92.5 kg)  08/09/20 209 lb (94.8 kg)     She was last seen for hypertension 4 months ago.  BP at that visit was 131/82. Management since that visit includes continue trianterene-hctz, stop metoprolol, and start lowe dose dilitiazem. She reports excellent compliance with treatment. She is not having side effects.  She is not exercising. She is adherent to low salt diet.   Outside blood pressures are not being checked.  She does not smoke.  Use of agents associated with hypertension: none.   ---------------------------------------------------------------------------------------------------  Hypothyroid, follow-up  Lab Results  Component Value Date   TSH 0.929 02/12/2021   TSH 0.322 (L) 11/02/2020   TSH 0.181 (L) 08/09/2020   T4TOTAL 8.4 09/04/2016    Wt Readings from Last 3 Encounters:  02/15/21 201 lb (91.2 kg)  11/02/20 204 lb (92.5 kg)  08/09/20 209 lb (94.8 kg)    She was last seen for hypothyroid 4 months ago.  Management since that visit includes decrease Synthroid to 159mcg daily. She reports excellent compliance with treatment. She is not having side effects.   Symptoms: No change in energy level No  constipation  No diarrhea No heat / cold intolerance  No nervousness No palpitations  No weight changes    ----------------------------------------------------------------------------------------- Anxiety, Follow-up  She was last seen for anxiety 6 months ago. Changes made at last visit include no changes.   She reports excellent compliance with treatment. She reports excellent tolerance of treatment. She is not having side effects.   She feels her anxiety is mild and Improved since last visit.  Symptoms: No chest pain No difficulty concentrating  No dizziness No fatigue  No feelings of losing control No insomnia  No irritable No palpitations  No panic attacks No racing thoughts  No shortness of breath No sweating  No tremors/shakes    GAD-7 Results GAD-7 Generalized Anxiety Disorder Screening Tool 02/15/2021 08/09/2020  1. Feeling Nervous, Anxious, or on Edge 1 0  2. Not Being Able to Stop or Control Worrying 0 0  3. Worrying Too Much About Different Things 0 0  4. Trouble Relaxing 0 0  5. Being So Restless it's Hard To Sit Still 0 0  6. Becoming Easily Annoyed or Irritable 0 0  7. Feeling Afraid As If Something Awful Might Happen 0 0  Total GAD-7 Score 1 0  Difficulty At Work, Home, or Getting  Along With Others? Not difficult at all Not difficult at all    PHQ-9 Scores PHQ9 SCORE ONLY 02/15/2021 08/09/2020 03/01/2020  PHQ-9 Total Score 1 1 0    ---------------------------------------------------------------------------------------------------   Medications: Outpatient Medications Prior to Visit  Medication Sig   albuterol (VENTOLIN HFA) 108 (90 Base) MCG/ACT inhaler Inhale 1-2  puffs into the lungs every 4 (four) hours as needed for wheezing or shortness of breath.   diltiazem (CARDIZEM CD) 120 MG 24 hr capsule Take 1 capsule (120 mg total) by mouth daily.   fluticasone (FLONASE) 50 MCG/ACT nasal spray Place 2 sprays into both nostrils daily.   FLUTICASONE  PROPIONATE, NASAL, NA Place into the nose.   fluticasone-salmeterol (ADVAIR HFA) 115-21 MCG/ACT inhaler Inhale 2 puffs into the lungs 2 (two) times daily.   hyoscyamine (LEVBID) 0.375 MG 12 hr tablet Take 1 tablet (0.375 mg total) by mouth every 12 (twelve) hours as needed. TAKE ONE TABLET (0.375 MG) BY MOUTH TWICE DAILY   levothyroxine (SYNTHROID) 100 MCG tablet Take 1 tablet (100 mcg total) by mouth daily.   loratadine (CLARITIN) 10 MG tablet Take 1 tablet (10 mg total) by mouth daily.   montelukast (SINGULAIR) 10 MG tablet Take 1 tablet (10 mg total) by mouth at bedtime.   naproxen sodium (ALEVE) 220 MG tablet Take 220 mg by mouth 2 (two) times daily as needed.   omeprazole (PRILOSEC) 40 MG capsule Take 40 mg by mouth daily.   potassium chloride SA (KLOR-CON) 20 MEQ tablet TAKE ONE TABLET TWICE DAILY   rosuvastatin (CRESTOR) 5 MG tablet TAKE 1 TABLET BY MOUTH DAILY   sertraline (ZOLOFT) 50 MG tablet TAKE 1 TABLET BY MOUTH DAILY.   triamterene-hydrochlorothiazide (MAXZIDE-25) 37.5-25 MG tablet TAKE 1 TABLET BY MOUTH DAILY   No facility-administered medications prior to visit.    Review of Systems  Constitutional:  Negative for activity change, fever and unexpected weight change.  Respiratory:  Negative for chest tightness and shortness of breath.   Cardiovascular:  Negative for chest pain, palpitations and leg swelling.       Objective    BP 136/82 (BP Location: Right Arm, Patient Position: Sitting, Cuff Size: Large)   Pulse 78   Temp 97.7 F (36.5 C) (Temporal)   Resp 16   Wt 201 lb (91.2 kg)   SpO2 98%   BMI 32.44 kg/m  BP Readings from Last 3 Encounters:  02/15/21 136/82  11/02/20 131/82  08/09/20 104/68   Wt Readings from Last 3 Encounters:  02/15/21 201 lb (91.2 kg)  11/02/20 204 lb (92.5 kg)  08/09/20 209 lb (94.8 kg)      Physical Exam Vitals reviewed.  Constitutional:      General: She is not in acute distress.    Appearance: Normal appearance. She is not  ill-appearing or toxic-appearing.  HENT:     Head: Normocephalic and atraumatic.     Right Ear: External ear normal.     Left Ear: External ear normal.     Nose: Nose normal.     Mouth/Throat:     Mouth: Mucous membranes are moist.     Pharynx: Oropharynx is clear. No oropharyngeal exudate or posterior oropharyngeal erythema.  Eyes:     General: No scleral icterus.    Extraocular Movements: Extraocular movements intact.     Conjunctiva/sclera: Conjunctivae normal.     Pupils: Pupils are equal, round, and reactive to light.  Cardiovascular:     Rate and Rhythm: Normal rate and regular rhythm.     Pulses: Normal pulses.     Heart sounds: Normal heart sounds. No murmur heard.   No friction rub. No gallop.  Pulmonary:     Effort: Pulmonary effort is normal. No respiratory distress.     Breath sounds: Normal breath sounds. No wheezing or rhonchi.  Chest:  Chest wall: No tenderness.  Abdominal:     General: Abdomen is flat. There is no distension.     Palpations: Abdomen is soft.     Tenderness: There is no abdominal tenderness.  Musculoskeletal:        General: Normal range of motion.     Cervical back: Normal range of motion and neck supple.     Right lower leg: No edema.     Left lower leg: No edema.  Skin:    General: Skin is warm and dry.     Capillary Refill: Capillary refill takes less than 2 seconds.     Findings: No lesion or rash.  Neurological:     General: No focal deficit present.     Mental Status: She is alert and oriented to person, place, and time. Mental status is at baseline.  Psychiatric:        Mood and Affect: Mood normal.     No results found for any visits on 02/15/21.  Assessment & Plan     Problem List Items Addressed This Visit       Cardiovascular and Mediastinum   Essential hypertension    Well controlled Tolerating medication changes well Continue current medications Recheck metabolic panel      Relevant Orders   Basic Metabolic  Panel (BMET)     Endocrine   Adult hypothyroidism - Primary    TSH wnl Continue Synthroid 110mcg         Other   Hyperlipemia, mixed    Well controlled Continue rosuvastatin 5mg       Obesity    Discussed importance of healthy weight management Discussed diet and exercise      Prediabetes    Stable, last A1c 6.3 Continue diet control      Relevant Orders   Hemoglobin A1c   Anxiety    Well controlled Continue Zoloft 50mg   PHQ-9 score 1, GAD-& score 1 - can consider taper at next visit       Other Visit Diagnoses     History of colon polyps       Relevant Orders   Ambulatory referral to Gastroenterology        Return in about 3 months (around 05/16/2021) for CPE - welcome to medicare.     Nelva Nay, Medical Student 02/15/2021, 10:55 AM   Patient seen along with MS3 student Nelva Nay. I personally evaluated this patient along with the student, and verified all aspects of the history, physical exam, and medical decision making as documented by the student. I agree with the student's documentation and have made all necessary edits.  Khari Mally, Dionne Bucy, MD, MPH [facility]

## 2021-02-15 NOTE — Assessment & Plan Note (Addendum)
Well controlled Continue Zoloft 50mg   PHQ-9 score 1, GAD-& score 1 - can consider taper at next visit

## 2021-02-16 LAB — BASIC METABOLIC PANEL
BUN/Creatinine Ratio: 16 (ref 12–28)
BUN: 15 mg/dL (ref 8–27)
CO2: 25 mmol/L (ref 20–29)
Calcium: 9.4 mg/dL (ref 8.7–10.3)
Chloride: 101 mmol/L (ref 96–106)
Creatinine, Ser: 0.96 mg/dL (ref 0.57–1.00)
Glucose: 105 mg/dL — ABNORMAL HIGH (ref 70–99)
Potassium: 3.7 mmol/L (ref 3.5–5.2)
Sodium: 141 mmol/L (ref 134–144)
eGFR: 66 mL/min/{1.73_m2} (ref >59)

## 2021-02-16 LAB — HEMOGLOBIN A1C
Est. average glucose Bld gHb Est-mCnc: 134 mg/dL
Hgb A1c MFr Bld: 6.3 % — ABNORMAL HIGH (ref 4.8–5.6)

## 2021-02-20 ENCOUNTER — Other Ambulatory Visit: Payer: Self-pay

## 2021-02-20 DIAGNOSIS — Z86010 Personal history of colonic polyps: Secondary | ICD-10-CM

## 2021-02-20 DIAGNOSIS — Z800 Family history of malignant neoplasm of digestive organs: Secondary | ICD-10-CM

## 2021-02-20 MED ORDER — CLENPIQ 10-3.5-12 MG-GM -GM/160ML PO SOLN: 1.0000 | Freq: Once | ORAL | 0 refills | Status: AC

## 2021-02-20 NOTE — Progress Notes (Signed)
Gastroenterology Pre-Procedure Review  Request Date: 03/26/2021 Requesting Physician: Dr. Vicente Males  PATIENT REVIEW QUESTIONS: The patient responded to the following health history questions as indicated:  3 year recall  1. Are you having any GI issues?  Does have IBS 2. Do you have a personal history of Polyps? yes (polyps removed 11/27/2017.) 3. Do you have a family history of Colon Cancer or Polyps? yes (Mother- colon cancer) 4. Diabetes Mellitus? no 5. Joint replacements in the past 12 months?no 6. Major health problems in the past 3 months?no 7. Any artificial heart valves, MVP, or defibrillator?no    MEDICATIONS & ALLERGIES:    Patient reports the following regarding taking any anticoagulation/antiplatelet therapy:   Plavix, Coumadin, Eliquis, Xarelto, Lovenox, Pradaxa, Brilinta, or Effient? no Aspirin? no  Patient confirms/reports the following medications:  Current Outpatient Medications  Medication Sig Dispense Refill   albuterol (VENTOLIN HFA) 108 (90 Base) MCG/ACT inhaler Inhale 1-2 puffs into the lungs every 4 (four) hours as needed for wheezing or shortness of breath. 3 each 3   diltiazem (CARDIZEM CD) 120 MG 24 hr capsule Take 1 capsule (120 mg total) by mouth daily. 30 capsule 5   fluticasone (FLONASE) 50 MCG/ACT nasal spray Place 2 sprays into both nostrils daily.     FLUTICASONE PROPIONATE, NASAL, NA Place into the nose.     fluticasone-salmeterol (ADVAIR HFA) 115-21 MCG/ACT inhaler Inhale 2 puffs into the lungs 2 (two) times daily.     hyoscyamine (LEVBID) 0.375 MG 12 hr tablet Take 1 tablet (0.375 mg total) by mouth every 12 (twelve) hours as needed. TAKE ONE TABLET (0.375 MG) BY MOUTH TWICE DAILY 60 tablet 3   levothyroxine (SYNTHROID) 100 MCG tablet Take 1 tablet (100 mcg total) by mouth daily. 90 tablet 3   loratadine (CLARITIN) 10 MG tablet Take 1 tablet (10 mg total) by mouth daily. 30 tablet 11   montelukast (SINGULAIR) 10 MG tablet Take 1 tablet (10 mg total) by  mouth at bedtime. 90 tablet 1   naproxen sodium (ALEVE) 220 MG tablet Take 220 mg by mouth 2 (two) times daily as needed.     omeprazole (PRILOSEC) 40 MG capsule Take 40 mg by mouth daily.     potassium chloride SA (KLOR-CON) 20 MEQ tablet TAKE ONE TABLET TWICE DAILY 180 tablet 2   rosuvastatin (CRESTOR) 5 MG tablet TAKE 1 TABLET BY MOUTH DAILY 90 tablet 0   sertraline (ZOLOFT) 50 MG tablet TAKE 1 TABLET BY MOUTH DAILY. 30 tablet 5   triamterene-hydrochlorothiazide (MAXZIDE-25) 37.5-25 MG tablet TAKE 1 TABLET BY MOUTH DAILY 90 tablet 0   No current facility-administered medications for this visit.    Patient confirms/reports the following allergies:  Allergies  Allergen Reactions   Amoxicillin     Other reaction(s): Rash, Hives ()   Gentamicin Sulfate    Lisinopril Cough    No orders of the defined types were placed in this encounter.   AUTHORIZATION INFORMATION Primary Insurance: 1D#: Group #:  Secondary Insurance: 1D#: Group #:  SCHEDULE INFORMATION: Date: 03/26/2021 Time: Location: [facility]

## 2021-02-21 ENCOUNTER — Other Ambulatory Visit: Payer: Self-pay | Admitting: Family Medicine

## 2021-02-21 DIAGNOSIS — K589 Irritable bowel syndrome without diarrhea: Secondary | ICD-10-CM

## 2021-02-27 DIAGNOSIS — M62830 Muscle spasm of back: Secondary | ICD-10-CM | POA: Diagnosis not present

## 2021-02-27 DIAGNOSIS — M25511 Pain in right shoulder: Secondary | ICD-10-CM | POA: Diagnosis not present

## 2021-02-27 DIAGNOSIS — M9901 Segmental and somatic dysfunction of cervical region: Secondary | ICD-10-CM | POA: Diagnosis not present

## 2021-02-27 DIAGNOSIS — M5412 Radiculopathy, cervical region: Secondary | ICD-10-CM | POA: Diagnosis not present

## 2021-03-14 ENCOUNTER — Other Ambulatory Visit: Payer: Self-pay | Admitting: Family Medicine

## 2021-03-14 DIAGNOSIS — I10 Essential (primary) hypertension: Secondary | ICD-10-CM

## 2021-03-15 NOTE — Telephone Encounter (Signed)
Requested Prescriptions  Pending Prescriptions Disp Refills   sertraline (ZOLOFT) 50 MG tablet [Pharmacy Med Name: SERTRALINE HCL 50 MG TAB] 90 tablet 0    Sig: TAKE 1 TABLET BY MOUTH DAILY.     Psychiatry:  Antidepressants - SSRI Passed - 03/14/2021  8:35 PM      Passed - Valid encounter within last 6 months    Recent Outpatient Visits          4 weeks ago Adult hypothyroidism   [facility] [place], Dionne Bucy, MD   4 months ago Prediabetes   [facility] [place], Dionne Bucy, MD   6 months ago Viral upper respiratory tract infection   [place], [place], DO   7 months ago [place], Dionne Bucy, MD   8 months ago [place] Jerrol Banana., MD      Future Appointments            In 2 months [place], Dionne Bucy, MD [facility]            ALLERGY RELIEF 10 MG tablet [Pharmacy Med Name: ALLERGY RELIEF 10 MG TAB] 30 tablet 11    Sig: TAKE 1 TABLET BY MOUTH DAILY.     Ear, Nose, and Throat:  Antihistamines Passed - 03/14/2021  8:35 PM      Passed - Valid encounter within last 12 months    Recent Outpatient Visits          4 weeks ago Adult hypothyroidism   [facility] [place], Dionne Bucy, MD   4 months ago Prediabetes   [facility] [place], Dionne Bucy, MD   6 months ago Viral upper respiratory tract infection   [place], [place], DO   7 months ago [place], Dionne Bucy, MD   8 months ago [place] Jerrol Banana., MD      Future Appointments            In 2 months Bacigalupo, Dionne Bucy, MD [facility] [place]

## 2021-03-15 NOTE — Telephone Encounter (Signed)
Requested Prescriptions  Pending Prescriptions Disp Refills   diltiazem (CARDIZEM CD) 120 MG 24 hr capsule [Pharmacy Med Name: DILTIAZEM HCL ER COATED BEADS 120 M] 90 capsule 0    Sig: TAKE 1 CAPSULE (120 MG) BY MOUTH EVERY DAY     Cardiovascular:  Calcium Channel Blockers Passed - 03/14/2021  8:34 PM      Passed - Last BP in normal range    BP Readings from Last 1 Encounters:  02/15/21 136/82         Passed - Valid encounter within last 6 months    Recent Outpatient Visits          4 weeks ago Adult hypothyroidism   [facility] [place], Dionne Bucy, MD   4 months ago Prediabetes   [facility] [place], Dionne Bucy, MD   6 months ago Viral upper respiratory tract infection   [place], [place], DO   7 months ago [place], Dionne Bucy, MD   8 months ago [place] Jerrol Banana., MD      Future Appointments            In 2 months Bacigalupo, Dionne Bucy, MD [facility]            rosuvastatin (CRESTOR) 5 MG tablet [Pharmacy Med Name: ROSUVASTATIN CALCIUM 5 MG TAB] 90 tablet 1    Sig: TAKE 1 TABLET BY MOUTH DAILY     Cardiovascular:  Antilipid - Statins Passed - 03/14/2021  8:34 PM      Passed - Total Cholesterol in normal range and within 360 days    Cholesterol, Total  Date Value Ref Range Status  11/02/2020 143 100 - 199 mg/dL Final         Passed - LDL in normal range and within 360 days    LDL Chol Calc (NIH)  Date Value Ref Range Status  11/02/2020 78 0 - 99 mg/dL Final         Passed - HDL in normal range and within 360 days    HDL  Date Value Ref Range Status  11/02/2020 42 >39 mg/dL Final         Passed - Triglycerides in normal range and within 360 days    Triglycerides  Date Value Ref Range Status  11/02/2020 132 0 - 149 mg/dL Final         Passed - Patient is not pregnant      Passed - Valid  encounter within last 12 months    Recent Outpatient Visits          4 weeks ago Adult hypothyroidism   [facility] [place], Dionne Bucy, MD   4 months ago Prediabetes   [facility] [place], Dionne Bucy, MD   6 months ago Viral upper respiratory tract infection   [place], [place], DO   7 months ago [place], Dionne Bucy, MD   8 months ago [place] Jerrol Banana., MD      Future Appointments            In 2 months Bacigalupo, Dionne Bucy, MD [facility]            triamterene-hydrochlorothiazide (MAXZIDE-25) 37.5-25 MG tablet [Pharmacy Med Name: TRIAMTERENE-HCTZ 37.5-25 MG TAB] 90 tablet 0    Sig: TAKE 1  TABLET BY MOUTH DAILY     Cardiovascular: Diuretic Combos Passed - 03/14/2021  8:34 PM      Passed - K in normal range and within 360 days    Potassium  Date Value Ref Range Status  02/15/2021 3.7 3.5 - 5.2 mmol/L Final         Passed - Na in normal range and within 360 days    Sodium  Date Value Ref Range Status  02/15/2021 141 134 - 144 mmol/L Final         Passed - Cr in normal range and within 360 days    Creatinine, Ser  Date Value Ref Range Status  02/15/2021 0.96 0.57 - 1.00 mg/dL Final         Passed - Ca in normal range and within 360 days    Calcium  Date Value Ref Range Status  02/15/2021 9.4 8.7 - 10.3 mg/dL Final         Passed - Last BP in normal range    BP Readings from Last 1 Encounters:  02/15/21 136/82         Passed - Valid encounter within last 6 months    Recent Outpatient Visits          4 weeks ago Adult hypothyroidism   [facility] [place], Dionne Bucy, MD   4 months ago Prediabetes   [facility] [place], Dionne Bucy, MD   6 months ago Viral upper respiratory tract infection   [place], [place], DO   7 months ago [place], Dionne Bucy, MD   8 months ago [place] Jerrol Banana., MD      Future Appointments            In 2 months Bacigalupo, Dionne Bucy, MD [facility], [place]

## 2021-03-25 ENCOUNTER — Encounter: Payer: Self-pay | Admitting: Gastroenterology

## 2021-03-26 ENCOUNTER — Encounter
Admission: RE | Disposition: A | Discharge status: Home / Self Care | Payer: Self-pay | Source: Home / Self Care | Attending: Gastroenterology

## 2021-03-26 ENCOUNTER — Ambulatory Visit
Admission: RE | Admit: 2021-03-26 | Discharge: 2021-03-26 | Disposition: A | Discharge status: Home / Self Care | Payer: Medicare PPO | Attending: Gastroenterology | Admitting: Gastroenterology

## 2021-03-26 ENCOUNTER — Ambulatory Visit: Payer: Medicare PPO | Admitting: Certified Registered"

## 2021-03-26 ENCOUNTER — Encounter: Payer: Self-pay | Admitting: Gastroenterology

## 2021-03-26 ENCOUNTER — Other Ambulatory Visit: Payer: Self-pay

## 2021-03-26 DIAGNOSIS — Z86010 Personal history of colonic polyps: Secondary | ICD-10-CM | POA: Insufficient documentation

## 2021-03-26 DIAGNOSIS — M1990 Unspecified osteoarthritis, unspecified site: Secondary | ICD-10-CM | POA: Diagnosis not present

## 2021-03-26 DIAGNOSIS — K5730 Diverticulosis of large intestine without perforation or abscess without bleeding: Secondary | ICD-10-CM | POA: Diagnosis not present

## 2021-03-26 DIAGNOSIS — D126 Benign neoplasm of colon, unspecified: Secondary | ICD-10-CM | POA: Diagnosis not present

## 2021-03-26 DIAGNOSIS — D122 Benign neoplasm of ascending colon: Secondary | ICD-10-CM | POA: Insufficient documentation

## 2021-03-26 DIAGNOSIS — I10 Essential (primary) hypertension: Secondary | ICD-10-CM | POA: Insufficient documentation

## 2021-03-26 DIAGNOSIS — E039 Hypothyroidism, unspecified: Secondary | ICD-10-CM | POA: Diagnosis not present

## 2021-03-26 DIAGNOSIS — K635 Polyp of colon: Secondary | ICD-10-CM | POA: Diagnosis not present

## 2021-03-26 DIAGNOSIS — F419 Anxiety disorder, unspecified: Secondary | ICD-10-CM | POA: Insufficient documentation

## 2021-03-26 DIAGNOSIS — R011 Cardiac murmur, unspecified: Secondary | ICD-10-CM | POA: Insufficient documentation

## 2021-03-26 DIAGNOSIS — Z1211 Encounter for screening for malignant neoplasm of colon: Secondary | ICD-10-CM | POA: Insufficient documentation

## 2021-03-26 DIAGNOSIS — J45909 Unspecified asthma, uncomplicated: Secondary | ICD-10-CM | POA: Insufficient documentation

## 2021-03-26 DIAGNOSIS — Z800 Family history of malignant neoplasm of digestive organs: Secondary | ICD-10-CM

## 2021-03-26 DIAGNOSIS — K5790 Diverticulosis of intestine, part unspecified, without perforation or abscess without bleeding: Secondary | ICD-10-CM | POA: Diagnosis not present

## 2021-03-26 DIAGNOSIS — Z85820 Personal history of malignant melanoma of skin: Secondary | ICD-10-CM | POA: Insufficient documentation

## 2021-03-26 HISTORY — PX: COLONOSCOPY WITH PROPOFOL: SHX5780

## 2021-03-26 SURGERY — COLONOSCOPY WITH PROPOFOL
Anesthesia: General

## 2021-03-26 MED ORDER — PROPOFOL 500 MG/50ML IV EMUL
INTRAVENOUS | Status: DC | PRN
  Administered 2021-03-26: 150 ug/kg/min via INTRAVENOUS

## 2021-03-26 MED ORDER — SODIUM CHLORIDE 0.9 % IV SOLN: INTRAVENOUS | Status: DC

## 2021-03-26 MED ORDER — PROPOFOL 10 MG/ML IV BOLUS
INTRAVENOUS | Status: DC | PRN
  Administered 2021-03-26: 180 mg via INTRAVENOUS

## 2021-03-26 MED ORDER — DEXMEDETOMIDINE HCL IN NACL 200 MCG/50ML IV SOLN
INTRAVENOUS | Status: DC | PRN
Start: 2021-03-26 — End: 2021-03-26
  Administered 2021-03-26: 10 ug via INTRAVENOUS

## 2021-03-26 NOTE — Anesthesia Postprocedure Evaluation (Signed)
Anesthesia Post Note  Patient: Angela Villarreal  Procedure(s) Performed: COLONOSCOPY WITH PROPOFOL  Patient location during evaluation: Endoscopy Anesthesia Type: General Level of consciousness: awake and alert Pain management: pain level controlled Vital Signs Assessment: post-procedure vital signs reviewed and stable Respiratory status: spontaneous breathing, nonlabored ventilation, respiratory function stable and patient connected to nasal cannula oxygen Cardiovascular status: blood pressure returned to baseline and stable Postop Assessment: no apparent nausea or vomiting Anesthetic complications: no   No notable events documented.   Last Vitals:  Vitals:   03/26/21 0950 03/26/21 1000  BP: 113/71 130/71  Pulse: 73 72  Resp: 17 14  Temp:    SpO2: 96% 98%    Last Pain:  Vitals:   03/26/21 0930  TempSrc: Temporal  PainSc:                  Arita Miss

## 2021-03-26 NOTE — Anesthesia Preprocedure Evaluation (Signed)
Anesthesia Evaluation  Patient identified by MRN, date of birth, ID band Patient awake    Reviewed: Allergy & Precautions, NPO status , Patient's Chart, lab work & pertinent test results  History of Anesthesia Complications Negative for: history of anesthetic complications  Airway Mallampati: II  TM Distance: <3 FB Neck ROM: Full    Dental no notable dental hx. (+) Teeth Intact   Pulmonary asthma , neg sleep apnea, neg COPD, Patient abstained from smoking.Not current smoker,    Pulmonary exam normal breath sounds clear to auscultation       Cardiovascular Exercise Tolerance: Good METShypertension, (-) CAD and (-) Past MI (-) dysrhythmias  Rhythm:Regular Rate:Normal - Systolic murmurs    Neuro/Psych PSYCHIATRIC DISORDERS Anxiety negative neurological ROS     GI/Hepatic neg GERD  ,(+)     (-) substance abuse  ,   Endo/Other  neg diabetesHypothyroidism   Renal/GU negative Renal ROS     Musculoskeletal  (+) Arthritis ,   Abdominal   Peds  Hematology   Anesthesia Other Findings Past Medical History: No date: Asthma 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 Excised: 04/19/2009, margins free. 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 No date: Hypertension No date: Hypothyroidism  Reproductive/Obstetrics                             Anesthesia Physical Anesthesia Plan  ASA: 2  Anesthesia Plan: General   Post-op Pain Management: Minimal or no pain anticipated   Induction: Intravenous  PONV Risk Score and Plan: 3 and Propofol infusion, TIVA and Ondansetron  Airway Management Planned: Nasal Cannula  Additional Equipment: None  Intra-op Plan:   Post-operative Plan:   Informed Consent: I have reviewed the patients History and Physical, chart, labs and discussed the procedure including the risks, benefits and alternatives  for the proposed anesthesia with the patient or authorized representative who has indicated his/her understanding and acceptance.     Dental advisory given  Plan Discussed with: CRNA and Surgeon  Anesthesia Plan Comments: (Discussed risks of anesthesia with patient, including possibility of difficulty with spontaneous ventilation under anesthesia necessitating airway intervention, PONV, and rare risks such as cardiac or respiratory or neurological events, and allergic reactions. Discussed the role of CRNA in patient's perioperative care. Patient understands.)        Anesthesia Quick Evaluation

## 2021-03-26 NOTE — Transfer of Care (Signed)
Immediate Anesthesia Transfer of Care Note  Patient: Angela Villarreal  Procedure(s) Performed: COLONOSCOPY WITH PROPOFOL  Patient Location: PACU  Anesthesia Type:General  Level of Consciousness: drowsy  Airway & Oxygen Therapy: Patient Spontanous Breathing  Post-op Assessment: Report given to RN  Post vital signs: stable  Last Vitals:  Vitals Value Taken Time  BP    Temp    Pulse    Resp    SpO2      Last Pain:  Vitals:   03/26/21 0855  TempSrc: Temporal  PainSc: 0-No pain         Complications: No notable events documented.

## 2021-03-26 NOTE — Op Note (Signed)
[facility] Patient Name: Angela Villarreal Procedure Date: 03/26/2021 8:45 AM MRN: [id number] Account #: [id number] Date of Birth: 05-25-1955 Admit Type: Outpatient Age: 66 Room: [facility] ENDO ROOM 2 Gender: Female Note Status: Finalized Instrument Name: Jasper Riling [id number] Procedure:             Colonoscopy Indications:           Surveillance: Personal history of adenomatous polyps                         on last colonoscopy > 3 years ago, Last colonoscopy:                         September 2019 Providers:             Jonathon Bellows MD, MD Medicines:             Monitored Anesthesia Care Complications:         No immediate complications. Procedure:             Pre-Anesthesia Assessment:                        - Prior to the procedure, a History and Physical was                         performed, and patient medications, allergies and                         sensitivities were reviewed. The patient's tolerance                         of previous anesthesia was reviewed.                        - The risks and benefits of the procedure and the                         sedation options and risks were discussed with the                         patient. All questions were answered and informed                         consent was obtained.                        - ASA Grade Assessment: II - A patient with mild                         systemic disease.                        After obtaining informed consent, the colonoscope was                         passed under direct vision. Throughout the procedure,                         the patient's blood pressure, pulse, and oxygen  saturations were monitored continuously. The                         Colonoscope was introduced through the anus and                         advanced to the the cecum, identified by the                         appendiceal orifice. The colonoscopy was performed                          without difficulty. The patient tolerated the                         procedure well. The quality of the bowel preparation                         was good. Findings:      The perianal and digital rectal examinations were normal.      Four sessile polyps were found in the ascending colon. The polyps were 5       to 6 mm in size. These polyps were removed with a cold snare. Resection       and retrieval were complete.      Multiple small-mouthed diverticula were found in the sigmoid colon.      The exam was otherwise without abnormality on direct and retroflexion       views. Impression:            - Four 5 to 6 mm polyps in the ascending colon,                         removed with a cold snare. Resected and retrieved.                        - Diverticulosis in the sigmoid colon.                        - The examination was otherwise normal on direct and                         retroflexion views. Recommendation:        - Discharge patient to home (with escort).                        - Resume previous diet.                        - Continue present medications.                        - Await pathology results.                        - Repeat colonoscopy for surveillance based on                         pathology results. Procedure Code(s):     --- Professional ---  45385, Colonoscopy, flexible; with removal of                         tumor(s), polyp(s), or other lesion(s) by snare                         technique Diagnosis Code(s):     --- Professional ---                        Z86.010, Personal history of colonic polyps                        K63.5, Polyp of colon                        K57.30, Diverticulosis of large intestine without                         perforation or abscess without bleeding CPT copyright 2019 American Medical Association. All rights reserved. The codes documented in this report are preliminary and upon coder review may   be revised to meet current compliance requirements. Jonathon Bellows, MD Jonathon Bellows MD, MD 03/26/2021 9:35:06 AM This report has been signed electronically. Number of Addenda: 0 Note Initiated On: 03/26/2021 8:45 AM Scope Withdrawal Time: 0 hours 17 minutes 24 seconds  Total Procedure Duration: 0 hours 21 minutes 41 seconds  Estimated Blood Loss:  Estimated blood loss: none.      [facility]

## 2021-03-26 NOTE — H&P (Signed)
Angela Bellows, MD [address], [place], [place], [place], [address] [address], [place], [place], [place], [address] Phone: [phone]  Fax: [phone]  Primary Care Physician:  Virginia Crews, MD   Pre-Procedure History & Physical: HPI:  Angela Villarreal is a 66 y.o. female is here for an colonoscopy.   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 AMP. Excised: 04/19/2009, margins free.   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 patient reports she also had ovaries removed.    [place]   Tacked   COLONOSCOPY WITH PROPOFOL N/A 11/27/2017   Procedure: COLONOSCOPY WITH PROPOFOL;  Surgeon: Angela Bellows, MD;  Location: [facility] ENDOSCOPY;  Service: Gastroenterology;  Laterality: N/A;   MELANOMA EXCISION  02/2004   removed from face   OOPHORECTOMY      Prior to Admission medications   Medication Sig Start Date End Date Taking? Authorizing Provider  albuterol (VENTOLIN HFA) 108 (90 Base) MCG/ACT inhaler Inhale 1-2 puffs into the lungs every 4 (four) hours as needed for wheezing or shortness of breath. 06/28/20  Yes Jerrol Banana., MD  ALLERGY RELIEF 10 MG tablet TAKE 1 TABLET BY MOUTH DAILY. 03/15/21  Yes Bacigalupo, Dionne Bucy, MD  diltiazem (CARDIZEM CD) 120 MG 24 hr capsule TAKE 1 CAPSULE (120 MG) BY MOUTH EVERY DAY 03/15/21  Yes Bacigalupo, Dionne Bucy, MD  fluticasone (FLONASE) 50 MCG/ACT nasal spray Place 2 sprays into both nostrils daily. 06/22/20  Yes [provider]  fluticasone-salmeterol (ADVAIR HFA) 115-21 MCG/ACT inhaler Inhale 2 puffs into the lungs 2 (two) times daily.   Yes [provider]  hyoscyamine (LEVBID) 0.375 MG 12 hr tablet TAKE ONE TABLET BY MOUTH TWICE DAILY 02/21/21  Yes  Bacigalupo, Dionne Bucy, MD  levothyroxine (SYNTHROID) 100 MCG tablet Take 1 tablet (100 mcg total) by mouth daily. 11/05/20  Yes Bacigalupo, Dionne Bucy, MD  naproxen sodium (ALEVE) 220 MG tablet Take 220 mg by mouth 2 (two) times daily as needed.   Yes [provider]  omeprazole (PRILOSEC) 40 MG capsule Take 40 mg by mouth daily. 12/27/18  Yes [provider]  potassium chloride SA (KLOR-CON) 20 MEQ tablet TAKE ONE TABLET TWICE DAILY 12/07/20  Yes Bacigalupo, Dionne Bucy, MD  rosuvastatin (CRESTOR) 5 MG tablet TAKE 1 TABLET BY MOUTH DAILY 03/15/21  Yes Bacigalupo, Dionne Bucy, MD  sertraline (ZOLOFT) 50 MG tablet TAKE 1 TABLET BY MOUTH DAILY. 03/15/21  Yes Bacigalupo, Dionne Bucy, MD  triamterene-hydrochlorothiazide (MAXZIDE-25) 37.5-25 MG tablet TAKE 1 TABLET BY MOUTH DAILY 03/15/21  Yes Bacigalupo, Dionne Bucy, MD  FLUTICASONE PROPIONATE, NASAL, NA Place into the nose.    [provider]  montelukast (SINGULAIR) 10 MG tablet Take 1 tablet (10 mg total) by mouth at bedtime. 09/07/15   Margarita Rana, MD    Allergies as of 02/20/2021 - Review Complete 02/15/2021  Allergen Reaction Noted   Amoxicillin  01/05/2021   Gentamicin sulfate  08/17/2014   Lisinopril Cough 11/02/2020    Family History  Problem Relation Age of Onset   Colon cancer Mother    Squamous cell carcinoma Mother    Alzheimer's disease Mother    Cancer Mother  colon   Non-Hodgkin's lymphoma Father    Thyroid disease Sister    Breast cancer Neg Hx     Social History   Socioeconomic History   Marital status: Divorced    Spouse name: Not on file   Number of children: Not on file   Years of education: Not on file   Highest education level: Not on file  Occupational History   Not on file  Tobacco Use   Smoking status: Never   Smokeless tobacco: Never  Vaping Use   Vaping Use: Never used  Substance and Sexual Activity   Alcohol use: Yes    Comment: once monthly   Drug use: No   Sexual activity:  Not on file  Other Topics Concern   Not on file  Social History Narrative   Not on file   Social Determinants of Health   Financial Resource Strain: Not on file  Food Insecurity: Not on file  Transportation Needs: Not on file  Physical Activity: Not on file  Stress: Not on file  Social Connections: Not on file  Intimate Partner Violence: Not on file    Review of Systems: See HPI, otherwise negative ROS  Physical Exam: BP (!) 156/88    Pulse 92    Temp (!) 96.9 F (36.1 C) (Temporal)    Resp 18    Ht 5\' 8"  (1.727 m)    Wt 90.7 kg    SpO2 98%    BMI 30.41 kg/m  General:   Alert,  pleasant and cooperative in NAD Head:  Normocephalic and atraumatic. Neck:  Supple; no masses or thyromegaly. Lungs:  Clear throughout to auscultation, normal respiratory effort.    Heart:  +S1, +S2, Regular rate and rhythm, No edema. Abdomen:  Soft, nontender and nondistended. Normal bowel sounds, without guarding, and without rebound.   Neurologic:  Alert and  oriented x4;  grossly normal neurologically.  Impression/Plan: [name] is here for an colonoscopy to be performed for surveillance due to prior history of colon polyps   Risks, benefits, limitations, and alternatives regarding  colonoscopy have been reviewed with the patient.  Questions have been answered.  All parties agreeable.   Angela Bellows, MD  03/26/2021, 9:00 AM

## 2021-03-27 ENCOUNTER — Encounter: Payer: Self-pay | Admitting: Family Medicine

## 2021-03-27 ENCOUNTER — Telehealth (INDEPENDENT_AMBULATORY_CARE_PROVIDER_SITE_OTHER): Payer: Medicare PPO | Admitting: Family Medicine

## 2021-03-27 DIAGNOSIS — J0100 Acute maxillary sinusitis, unspecified: Secondary | ICD-10-CM

## 2021-03-27 LAB — SURGICAL PATHOLOGY

## 2021-03-27 MED ORDER — DOXYCYCLINE HYCLATE 100 MG PO TABS: 100.0000 mg | ORAL_TABLET | Freq: Two times a day (BID) | ORAL | 0 refills | Status: AC

## 2021-03-27 NOTE — Progress Notes (Signed)
Virtual Visit via Video Note  I connected with Angela Villarreal on 03/27/21 at  9:40 AM EST by a video enabled telemedicine application and verified that I am speaking with the correct person using two identifiers.  Location: Patient: home Provider: [facility]   I discussed the limitations of evaluation and management by telemedicine and the availability of in person appointments. The patient expressed understanding and agreed to proceed.  History of Present Illness:  UPPER RESPIRATORY TRACT INFECTION - symptom onset last week, worse this morning - grandson with runny nose last week.  - COVID negative.   Fever: no Cough: yes Shortness of breath: no Chest pain: no Nasal congestion: yes Runny nose: yes, dark yellow Sinus pressure: yes Headache: yes Face pain: yes Toothache: yes Fatigue: yes Sick contacts: yes Context: worse Recurrent sinusitis: no Relief with OTC cold/cough medications: no  Treatments attempted: corcidin, mucinex, humidifer    Observations/Objective:  Tired appearing, in NAD. Speaks in full sentences, congested sounding. Comfortable WOB on RA. No resp distress.    Assessment and Plan:  Sinusitis COVID negative. Concern for bacterial involvement given worsened symptoms and duration of sx. Intolerance to augmentin, will send doxycycline. Reviewed emergency precautions.     I discussed the assessment and treatment plan with the patient. The patient was provided an opportunity to ask questions and all were answered. The patient agreed with the plan and demonstrated an understanding of the instructions.   The patient was advised to call back or seek an in-person evaluation if the symptoms worsen or if the condition fails to improve as anticipated.  I provided 12 minutes of non-face-to-face time during this encounter.   Myles Gip, DO

## 2021-04-03 DIAGNOSIS — M25511 Pain in right shoulder: Secondary | ICD-10-CM | POA: Diagnosis not present

## 2021-04-03 DIAGNOSIS — M5412 Radiculopathy, cervical region: Secondary | ICD-10-CM | POA: Diagnosis not present

## 2021-04-03 DIAGNOSIS — M62830 Muscle spasm of back: Secondary | ICD-10-CM | POA: Diagnosis not present

## 2021-04-03 DIAGNOSIS — M9901 Segmental and somatic dysfunction of cervical region: Secondary | ICD-10-CM | POA: Diagnosis not present

## 2021-04-15 ENCOUNTER — Encounter: Payer: Self-pay | Admitting: Gastroenterology

## 2021-04-15 ENCOUNTER — Telehealth: Payer: Self-pay

## 2021-04-15 NOTE — Telephone Encounter (Signed)
Wanted to know her results from colonoscopy

## 2021-04-18 ENCOUNTER — Encounter: Payer: Self-pay | Admitting: Gastroenterology

## 2021-04-30 DIAGNOSIS — M25511 Pain in right shoulder: Secondary | ICD-10-CM | POA: Diagnosis not present

## 2021-04-30 DIAGNOSIS — M62830 Muscle spasm of back: Secondary | ICD-10-CM | POA: Diagnosis not present

## 2021-04-30 DIAGNOSIS — M5412 Radiculopathy, cervical region: Secondary | ICD-10-CM | POA: Diagnosis not present

## 2021-04-30 DIAGNOSIS — M9901 Segmental and somatic dysfunction of cervical region: Secondary | ICD-10-CM | POA: Diagnosis not present

## 2021-05-03 DIAGNOSIS — M25511 Pain in right shoulder: Secondary | ICD-10-CM | POA: Diagnosis not present

## 2021-05-03 DIAGNOSIS — M62830 Muscle spasm of back: Secondary | ICD-10-CM | POA: Diagnosis not present

## 2021-05-03 DIAGNOSIS — M9901 Segmental and somatic dysfunction of cervical region: Secondary | ICD-10-CM | POA: Diagnosis not present

## 2021-05-03 DIAGNOSIS — M5412 Radiculopathy, cervical region: Secondary | ICD-10-CM | POA: Diagnosis not present

## 2021-05-07 DIAGNOSIS — M62830 Muscle spasm of back: Secondary | ICD-10-CM | POA: Diagnosis not present

## 2021-05-07 DIAGNOSIS — M5412 Radiculopathy, cervical region: Secondary | ICD-10-CM | POA: Diagnosis not present

## 2021-05-07 DIAGNOSIS — M25511 Pain in right shoulder: Secondary | ICD-10-CM | POA: Diagnosis not present

## 2021-05-07 DIAGNOSIS — M9901 Segmental and somatic dysfunction of cervical region: Secondary | ICD-10-CM | POA: Diagnosis not present

## 2021-05-08 DIAGNOSIS — J4541 Moderate persistent asthma with (acute) exacerbation: Secondary | ICD-10-CM | POA: Diagnosis not present

## 2021-05-09 DIAGNOSIS — M25511 Pain in right shoulder: Secondary | ICD-10-CM | POA: Diagnosis not present

## 2021-05-09 DIAGNOSIS — M62830 Muscle spasm of back: Secondary | ICD-10-CM | POA: Diagnosis not present

## 2021-05-09 DIAGNOSIS — M5412 Radiculopathy, cervical region: Secondary | ICD-10-CM | POA: Diagnosis not present

## 2021-05-09 DIAGNOSIS — M9901 Segmental and somatic dysfunction of cervical region: Secondary | ICD-10-CM | POA: Diagnosis not present

## 2021-05-14 DIAGNOSIS — M5412 Radiculopathy, cervical region: Secondary | ICD-10-CM | POA: Diagnosis not present

## 2021-05-14 DIAGNOSIS — M25511 Pain in right shoulder: Secondary | ICD-10-CM | POA: Diagnosis not present

## 2021-05-14 DIAGNOSIS — M62830 Muscle spasm of back: Secondary | ICD-10-CM | POA: Diagnosis not present

## 2021-05-14 DIAGNOSIS — M9901 Segmental and somatic dysfunction of cervical region: Secondary | ICD-10-CM | POA: Diagnosis not present

## 2021-05-16 ENCOUNTER — Other Ambulatory Visit: Payer: Self-pay | Admitting: Family Medicine

## 2021-05-17 DIAGNOSIS — M9901 Segmental and somatic dysfunction of cervical region: Secondary | ICD-10-CM | POA: Diagnosis not present

## 2021-05-17 DIAGNOSIS — M25511 Pain in right shoulder: Secondary | ICD-10-CM | POA: Diagnosis not present

## 2021-05-17 DIAGNOSIS — M62830 Muscle spasm of back: Secondary | ICD-10-CM | POA: Diagnosis not present

## 2021-05-17 DIAGNOSIS — M5412 Radiculopathy, cervical region: Secondary | ICD-10-CM | POA: Diagnosis not present

## 2021-05-17 NOTE — Telephone Encounter (Signed)
Requested medications are due for refill today.  yes ? ?Requested medications are on the active medications list.  yes ? ?Last refill. 03/15/2021 #90 0 refills ? ?Future visit scheduled.   yes ? ?Notes to clinic.  Refill for this medication is not delegated. Pt is due for labs. ? ? ? ?Requested Prescriptions  ?Pending Prescriptions Disp Refills  ? sertraline (ZOLOFT) 50 MG tablet [Pharmacy Med Name: SERTRALINE HCL 50 MG TAB] 90 tablet 0  ?  Sig: TAKE 1 TABLET BY MOUTH DAILY.  ?  ? Not Delegated - Psychiatry:  Antidepressants - SSRI - sertraline Failed - 05/16/2021  5:23 PM  ?  ?  Failed - This refill cannot be delegated  ?  ?  Failed - AST in normal range and within 360 days  ?  AST  ?Date Value Ref Range Status  ?01/19/2020 12 0 - 40 IU/L Final  ?  ?  ?  ?  Failed - ALT in normal range and within 360 days  ?  ALT  ?Date Value Ref Range Status  ?01/19/2020 11 0 - 32 IU/L Final  ?  ?  ?  ?  Passed - Completed PHQ-2 or PHQ-9 in the last 360 days  ?  ?  Passed - Valid encounter within last 6 months  ?  Recent Outpatient Visits   ? ?      ? 1 month ago Acute non-recurrent maxillary sinusitis  ? [place], DO  ? 3 months ago Adult hypothyroidism  ? [facility] [place], Dionne Bucy, MD  ? 6 months ago Prediabetes  ? [facility] [place], Dionne Bucy, MD  ? 8 months ago Viral upper respiratory tract infection  ? [place], DO  ? 9 months ago Anxiety  ? [facility] Bacigalupo, Dionne Bucy, MD  ? ?  ?  ?Future Appointments   ? ?        ? In 1 week Bacigalupo, Dionne Bucy, MD [facility]  ? ?  ? ?  ?  ?  ?  ?

## 2021-05-21 ENCOUNTER — Ambulatory Visit: Payer: Self-pay

## 2021-05-21 NOTE — Telephone Encounter (Signed)
?  Chief Complaint: Vomiting and diarrhea last night into this monring.  ?Symptoms: Vomiting and diarrhea ?Frequency: last night ?Pertinent Negatives: Patient denies fever, weakness ?Disposition: '[x]'$ ED /'[]'$ Urgent Care (no appt availability in office) / '[]'$ Appointment(In office/virtual)/ '[]'$  [place] [facility]/ '[]'$ Home Care/ '[]'$ Refused Recommended Disposition /'[]'$ [place] [facility]/ '[]'$  Follow-up with PCP ?Additional Notes: Pt rejected recommendation to go to ED/UC. Pt states she is feeling better and has kept water & soda down. Pt will seek immediate care if needed. Appt made for 05/23/2021 per pt request. Pt will cancel appt if not needed.  ? ? ?Summary: diarrhea/vomiting dvise  ? Pt had last night diarrhea and vomiting.  Last vomited 6:30 am. But still has diarrhea.  Unable to eat. Pt did drink a coke and water. Low grade fever.  Was 100 a little while ago.  ?Pt wants to know if we are seeing the norovirus. Should she be seen? What to do.  She is seeing family and friends this weekend, concerned that she may be contagious.   ?  ? ?Reason for Disposition ? [1] SEVERE vomiting (e.g., 6 or more times/day) AND [2] present > 8 hours (Exception: patient sounds well, is drinking liquids, does not sound dehydrated, and vomiting has lasted less than 24 hours) ? [1] MODERATE vomiting (e.g., 3 - 5 times/day) AND [2] age > 33 years ? ?Answer Assessment - Initial Assessment Questions ?1. VOMITING SEVERITY: "How many times have you vomited in the past 24 hours?"  ?   - MILD:  1 - 2 times/day ?   - MODERATE: 3 - 5 times/day, decreased oral intake without significant weight loss or symptoms of dehydration ?   - SEVERE: 6 or more times/day, vomits everything or nearly everything, with significant weight loss, symptoms of dehydration  ?    6+ ?2. ONSET: "When did the vomiting begin?"  ?    Last night 8 pm ?3. FLUIDS: "What fluids or food have you vomited up today?" "Have you been able to keep any fluids down?" ?    Water/soda ?4.  ABDOMINAL PAIN: "Are your having any abdominal pain?" If yes : "How bad is it and what does it feel like?" (e.g., crampy, dull, intermittent, constant)  ?    Last night ?5. DIARRHEA: "Is there any diarrhea?" If Yes, ask: "How many times today?"  ?    Yes - a little ?6. CONTACTS: "Is there anyone else in the family with the same symptoms?"  ?    no ?7. CAUSE: "What do you think is causing your vomiting?" ?    unknown ?8. HYDRATION STATUS: "Any signs of dehydration?" (e.g., dry mouth [not only dry lips], too weak to stand) "When did you last urinate?" ?    no ?9. OTHER SYMPTOMS: "Do you have any other symptoms?" (e.g., fever, headache, vertigo, vomiting blood or coffee grounds, recent head injury) ?    no ?10. PREGNANCY: "Is there any chance you are pregnant?" "When was your last menstrual period?" ?      na ? ?Protocols used: Vomiting-A-AH ? ?

## 2021-05-21 NOTE — Telephone Encounter (Signed)
Please see triage note below, with complaints of diarrhea and vomiting. Appt scheduled already for 05/23/21. According to triage nurse below: ?Additional Notes: Pt rejected recommendation to go to ED/UC. Pt states she is feeling better and has kept water & soda down. Pt will seek immediate care if needed. Appt made for 05/23/2021 per pt request. Pt will cancel appt if not needed.  ?  ?

## 2021-05-23 ENCOUNTER — Ambulatory Visit: Payer: BC Managed Care – PPO | Admitting: Physician Assistant

## 2021-05-28 ENCOUNTER — Encounter: Payer: Self-pay | Admitting: Family Medicine

## 2021-05-28 ENCOUNTER — Other Ambulatory Visit: Payer: Self-pay

## 2021-05-28 ENCOUNTER — Ambulatory Visit (INDEPENDENT_AMBULATORY_CARE_PROVIDER_SITE_OTHER): Payer: Medicare PPO | Admitting: Family Medicine

## 2021-05-28 VITALS — BP 113/76 | HR 88 | Temp 98.0°F | Resp 16 | Ht 64.5 in | Wt 198.1 lb

## 2021-05-28 DIAGNOSIS — Z0000 Encounter for general adult medical examination without abnormal findings: Secondary | ICD-10-CM

## 2021-05-28 DIAGNOSIS — Z780 Asymptomatic menopausal state: Secondary | ICD-10-CM

## 2021-05-28 DIAGNOSIS — Z23 Encounter for immunization: Secondary | ICD-10-CM | POA: Diagnosis not present

## 2021-05-28 DIAGNOSIS — Z1231 Encounter for screening mammogram for malignant neoplasm of breast: Secondary | ICD-10-CM

## 2021-05-28 NOTE — Addendum Note (Signed)
Addended by: Shawna Orleans on: 05/28/2021 11:43 AM ? ? Modules accepted: Orders ? ?

## 2021-05-28 NOTE — Progress Notes (Signed)
? ? ?I,Sulibeya S Dimas,acting as a scribe for [name], MD.,have documented all relevant documentation on the behalf of [name], MD,as directed by  [name], MD while in the presence of [name], MD. ? ? ?Annual Wellness Visit ? ?  ? ?Patient: Angela Villarreal, Female    DOB: 1956-01-08, 66 y.o.   MRN: [id number] ?Visit Date: 05/28/2021 ? ?Today's Provider: [name], MD  ? ?Chief Complaint  ?Patient presents with  ? welcome to medicare  ? ?Subjective  ?  ?Angela Villarreal is a 66 y.o. female who presents today for her Annual Wellness Visit. ?She reports consuming a general diet. The patient does not participate in regular exercise at present. She generally feels well. She reports sleeping well. She does not have additional problems to discuss today.  ? ?HPI ? ? ? ?Medications: ?Outpatient Medications Prior to Visit  ?Medication Sig  ? albuterol (VENTOLIN HFA) 108 (90 Base) MCG/ACT inhaler Inhale 1-2 puffs into the lungs every 4 (four) hours as needed for wheezing or shortness of breath.  ? ALLERGY RELIEF 10 MG tablet TAKE 1 TABLET BY MOUTH DAILY.  ? diltiazem (CARDIZEM CD) 120 MG 24 hr capsule TAKE 1 CAPSULE (120 MG) BY MOUTH EVERY DAY  ? fluticasone (FLONASE) 50 MCG/ACT nasal spray Place 2 sprays into both nostrils daily.  ? FLUTICASONE PROPIONATE, NASAL, NA Place into the nose.  ? fluticasone-salmeterol (ADVAIR HFA) 115-21 MCG/ACT inhaler Inhale 2 puffs into the lungs 2 (two) times daily.  ? hyoscyamine (LEVBID) 0.375 MG 12 hr tablet TAKE ONE TABLET BY MOUTH TWICE DAILY  ? levothyroxine (SYNTHROID) 100 MCG tablet Take 1 tablet (100 mcg total) by mouth daily.  ? montelukast (SINGULAIR) 10 MG tablet Take 1 tablet (10 mg total) by mouth at bedtime.  ? naproxen sodium (ALEVE) 220 MG tablet Take 220 mg by mouth 2 (two) times daily as needed.  ? omeprazole (PRILOSEC) 40 MG capsule Take 40 mg by mouth daily.  ? potassium chloride SA (KLOR-CON) 20 MEQ tablet TAKE ONE TABLET  TWICE DAILY  ? rosuvastatin (CRESTOR) 5 MG tablet TAKE 1 TABLET BY MOUTH DAILY  ? sertraline (ZOLOFT) 50 MG tablet TAKE 1 TABLET BY MOUTH DAILY.  ? triamterene-hydrochlorothiazide (MAXZIDE-25) 37.5-25 MG tablet TAKE 1 TABLET BY MOUTH DAILY  ? ?No facility-administered medications prior to visit.  ?  ?Allergies  ?Allergen Reactions  ? Amoxicillin   ?  Other reaction(s): Rash, Hives ()  ? Gentamicin Sulfate   ? Lisinopril Cough  ? ? ?Patient Care Team: ?Virginia Crews, MD as PCP - General (Family Medicine) ?Ralene Bathe, MD (Dermatology) ?Beverly Gust, MD (Otolaryngology) ?Jonathon Bellows, MD as Consulting Physician (Gastroenterology) ? ?Review of Systems  ?HENT:  Positive for postnasal drip and sinus pressure.   ?Respiratory:  Positive for cough.   ?Musculoskeletal:  Positive for arthralgias and neck pain.  ?Allergic/Immunologic: Positive for environmental allergies.  ?Neurological:  Positive for headaches.  ? ?Last CBC ?Lab Results  ?Component Value Date  ? WBC 7.1 09/05/2015  ? HGB 12.5 09/05/2015  ? HCT 36.4 09/05/2015  ? MCV 85 09/05/2015  ? MCH 29.2 09/05/2015  ? RDW 13.6 09/05/2015  ? PLT 241 09/05/2015  ? ?Last metabolic panel ?Lab Results  ?Component Value Date  ? GLUCOSE 105 (H) 02/15/2021  ? NA 141 02/15/2021  ? K 3.7 02/15/2021  ? CL 101 02/15/2021  ? CO2 25 02/15/2021  ? BUN 15 02/15/2021  ? CREATININE 0.96 02/15/2021  ? EGFR 66 02/15/2021  ?  CALCIUM 9.4 02/15/2021  ? PHOS 3.7 01/27/2020  ? PROT 6.8 01/19/2020  ? ALBUMIN 4.4 01/27/2020  ? LABGLOB 2.4 01/19/2020  ? AGRATIO 1.8 01/19/2020  ? BILITOT 0.3 01/19/2020  ? ALKPHOS 83 01/19/2020  ? AST 12 01/19/2020  ? ALT 11 01/19/2020  ? ?Last lipids ?Lab Results  ?Component Value Date  ? CHOL 143 11/02/2020  ? HDL 42 11/02/2020  ? [place] 78 11/02/2020  ? TRIG 132 11/02/2020  ? CHOLHDL 3.4 11/02/2020  ? ?Last hemoglobin A1c ?Lab Results  ?Component Value Date  ? HGBA1C 6.3 (H) 02/15/2021  ? ?Last thyroid functions ?Lab Results  ?Component Value Date   ? TSH 0.929 02/12/2021  ? T4TOTAL 8.4 09/04/2016  ? ?  ?  ? Objective  ?  ?Vitals: BP 113/76 (BP Location: Left Arm, Patient Position: Sitting, Cuff Size: Large)   Pulse 88   Temp 98 ?F (36.7 ?C) (Temporal)   Resp 16   Ht 5' 4.5" (1.638 m)   Wt 198 lb 1.6 oz (89.9 kg)   BMI 33.48 kg/m?  ?BP Readings from Last 3 Encounters:  ?05/28/21 113/76  ?03/26/21 130/71  ?02/15/21 136/82  ? ?Wt Readings from Last 3 Encounters:  ?05/28/21 198 lb 1.6 oz (89.9 kg)  ?03/26/21 200 lb (90.7 kg)  ?02/15/21 201 lb (91.2 kg)  ? ?  ? ? ?Physical Exam ?Vitals reviewed.  ?Constitutional:   ?   General: She is not in acute distress. ?   Appearance: Normal appearance. She is well-developed. She is not diaphoretic.  ?HENT:  ?   Head: Normocephalic and atraumatic.  ?   Right Ear: Tympanic membrane, ear canal and external ear normal.  ?   Left Ear: Tympanic membrane, ear canal and external ear normal.  ?   Nose: Nose normal.  ?   Mouth/Throat:  ?   Mouth: Mucous membranes are moist.  ?   Pharynx: Oropharynx is clear. No oropharyngeal exudate.  ?Eyes:  ?   General: No scleral icterus. ?   Conjunctiva/sclera: Conjunctivae normal.  ?   Pupils: Pupils are equal, round, and reactive to light.  ?Neck:  ?   Thyroid: No thyromegaly.  ?Cardiovascular:  ?   Rate and Rhythm: Normal rate and regular rhythm.  ?   Pulses: Normal pulses.  ?   Heart sounds: Normal heart sounds. No murmur heard. ?Pulmonary:  ?   Effort: Pulmonary effort is normal. No respiratory distress.  ?   Breath sounds: Normal breath sounds. No wheezing or rales.  ?Abdominal:  ?   General: There is no distension.  ?   Palpations: Abdomen is soft.  ?   Tenderness: There is no abdominal tenderness.  ?Musculoskeletal:     ?   General: No deformity.  ?   Cervical back: Neck supple.  ?   Right lower leg: No edema.  ?   Left lower leg: No edema.  ?Lymphadenopathy:  ?   Cervical: No cervical adenopathy.  ?Skin: ?   General: Skin is warm and dry.  ?   Findings: No rash.  ?Neurological:  ?    Mental Status: She is alert and oriented to person, place, and time. Mental status is at baseline.  ?   Sensory: No sensory deficit.  ?   Motor: No weakness.  ?   Gait: Gait normal.  ?Psychiatric:     ?   Mood and Affect: Mood normal.     ?   Behavior: Behavior normal.     ?  Thought Content: Thought content normal.  ? ? ? ?EKG: NSR. There is baseline variability, but distinct p waves can be made out ? ? ?Hearing Screening  ?Method: Audiometry  ? 500Hz 1000Hz 2000Hz 4000Hz  ?Right ear Pass Pass Pass Pass  ?Left ear Pass Pass Pass Pass  ?Comments: 40dBHL ? ?Vision Screening  ? Right eye Left eye Both eyes  ?Without correction     ?With correction 20/30 20/30 20/30  ?  ?Most recent functional status assessment: ?In your present state of health, do you have any difficulty performing the following activities: 05/28/2021  ?Hearing? N  ?Vision? N  ?Difficulty concentrating or making decisions? N  ?Walking or climbing stairs? N  ?Dressing or bathing? N  ?Doing errands, shopping? N  ?Some recent data might be hidden  ? ?Most recent fall risk assessment: ?Fall Risk  05/28/2021  ?Falls in the past year? 0  ?Number falls in past yr: 0  ?Injury with Fall? 0  ?Risk for fall due to : No Fall Risks  ?Follow up Falls evaluation completed  ? ? Most recent depression screenings: ?PHQ 2/9 Scores 05/28/2021 02/15/2021  ?PHQ - 2 Score 0 0  ?PHQ- 9 Score 2 1  ? ?Most recent cognitive screening: ?No flowsheet data found. ?Most recent Audit-C alcohol use screening ?Alcohol Use Disorder Test (AUDIT) 05/28/2021  ?1. How often do you have a drink containing alcohol? 2  ?2. How many drinks containing alcohol do you have on a typical day when you are drinking? 0  ?3. How often do you have six or more drinks on one occasion? 0  ?AUDIT-C Score 2  ?Alcohol Brief Interventions/Follow-up -  ? ?A score of 3 or more in women, and 4 or more in men indicates increased risk for alcohol abuse, EXCEPT if all of the points are from question 1  ? ?No results  found for any visits on 05/28/21. ? Assessment & Plan  ?  ? ?Annual wellness visit done today including the all of the following: ?Reviewed patient's Family Medical History ?Reviewed and updated list of patient'

## 2021-06-17 ENCOUNTER — Encounter: Payer: Self-pay | Admitting: Emergency Medicine

## 2021-06-17 ENCOUNTER — Inpatient Hospital Stay
Admission: EM | Admit: 2021-06-17 | Discharge: 2021-06-20 | DRG: 419 | Disposition: A | Discharge status: Home / Self Care | Payer: Medicare PPO | Attending: Internal Medicine | Admitting: Internal Medicine

## 2021-06-17 ENCOUNTER — Emergency Department: Payer: Medicare PPO

## 2021-06-17 ENCOUNTER — Other Ambulatory Visit: Payer: Self-pay

## 2021-06-17 ENCOUNTER — Ambulatory Visit: Payer: Self-pay | Admitting: *Deleted

## 2021-06-17 DIAGNOSIS — Z85820 Personal history of malignant melanoma of skin: Secondary | ICD-10-CM | POA: Diagnosis not present

## 2021-06-17 DIAGNOSIS — Z800 Family history of malignant neoplasm of digestive organs: Secondary | ICD-10-CM | POA: Diagnosis not present

## 2021-06-17 DIAGNOSIS — Z820 Family history of epilepsy and other diseases of the nervous system: Secondary | ICD-10-CM

## 2021-06-17 DIAGNOSIS — R9431 Abnormal electrocardiogram [ECG] [EKG]: Secondary | ICD-10-CM | POA: Diagnosis not present

## 2021-06-17 DIAGNOSIS — K8060 Calculus of gallbladder and bile duct with cholecystitis, unspecified, without obstruction: Secondary | ICD-10-CM | POA: Diagnosis not present

## 2021-06-17 DIAGNOSIS — K8042 Calculus of bile duct with acute cholecystitis without obstruction: Secondary | ICD-10-CM | POA: Diagnosis not present

## 2021-06-17 DIAGNOSIS — Z807 Family history of other malignant neoplasms of lymphoid, hematopoietic and related tissues: Secondary | ICD-10-CM

## 2021-06-17 DIAGNOSIS — Z79890 Hormone replacement therapy (postmenopausal): Secondary | ICD-10-CM | POA: Diagnosis not present

## 2021-06-17 DIAGNOSIS — Z8349 Family history of other endocrine, nutritional and metabolic diseases: Secondary | ICD-10-CM

## 2021-06-17 DIAGNOSIS — Z7951 Long term (current) use of inhaled steroids: Secondary | ICD-10-CM

## 2021-06-17 DIAGNOSIS — K812 Acute cholecystitis with chronic cholecystitis: Secondary | ICD-10-CM | POA: Diagnosis not present

## 2021-06-17 DIAGNOSIS — K838 Other specified diseases of biliary tract: Secondary | ICD-10-CM | POA: Diagnosis not present

## 2021-06-17 DIAGNOSIS — K8050 Calculus of bile duct without cholangitis or cholecystitis without obstruction: Secondary | ICD-10-CM | POA: Diagnosis not present

## 2021-06-17 DIAGNOSIS — K828 Other specified diseases of gallbladder: Secondary | ICD-10-CM | POA: Diagnosis not present

## 2021-06-17 DIAGNOSIS — Z6833 Body mass index (BMI) 33.0-33.9, adult: Secondary | ICD-10-CM

## 2021-06-17 DIAGNOSIS — K8062 Calculus of gallbladder and bile duct with acute cholecystitis without obstruction: Secondary | ICD-10-CM | POA: Diagnosis not present

## 2021-06-17 DIAGNOSIS — E039 Hypothyroidism, unspecified: Secondary | ICD-10-CM | POA: Diagnosis not present

## 2021-06-17 DIAGNOSIS — K810 Acute cholecystitis: Secondary | ICD-10-CM

## 2021-06-17 DIAGNOSIS — E876 Hypokalemia: Secondary | ICD-10-CM | POA: Diagnosis present

## 2021-06-17 DIAGNOSIS — E669 Obesity, unspecified: Secondary | ICD-10-CM | POA: Diagnosis present

## 2021-06-17 DIAGNOSIS — Z79899 Other long term (current) drug therapy: Secondary | ICD-10-CM

## 2021-06-17 DIAGNOSIS — R1011 Right upper quadrant pain: Secondary | ICD-10-CM | POA: Diagnosis not present

## 2021-06-17 DIAGNOSIS — I10 Essential (primary) hypertension: Secondary | ICD-10-CM | POA: Diagnosis not present

## 2021-06-17 DIAGNOSIS — J45909 Unspecified asthma, uncomplicated: Secondary | ICD-10-CM | POA: Diagnosis present

## 2021-06-17 DIAGNOSIS — R1010 Upper abdominal pain, unspecified: Principal | ICD-10-CM

## 2021-06-17 DIAGNOSIS — K449 Diaphragmatic hernia without obstruction or gangrene: Secondary | ICD-10-CM | POA: Diagnosis not present

## 2021-06-17 HISTORY — DX: Acute cholecystitis: K81.0

## 2021-06-17 LAB — COMPREHENSIVE METABOLIC PANEL
ALT: 77 U/L — ABNORMAL HIGH (ref 0–44)
AST: 183 U/L — ABNORMAL HIGH (ref 15–41)
Albumin: 4.1 g/dL (ref 3.5–5.0)
Alkaline Phosphatase: 105 U/L (ref 38–126)
Anion gap: 9 (ref 5–15)
BUN: 19 mg/dL (ref 8–23)
CO2: 28 mmol/L (ref 22–32)
Calcium: 9.1 mg/dL (ref 8.9–10.3)
Chloride: 101 mmol/L (ref 98–111)
Creatinine, Ser: 0.79 mg/dL (ref 0.44–1.00)
GFR, Estimated: >60 mL/min (ref >60)
Glucose, Bld: 159 mg/dL — ABNORMAL HIGH (ref 70–99)
Potassium: 3 mmol/L — ABNORMAL LOW (ref 3.5–5.1)
Sodium: 138 mmol/L (ref 135–145)
Total Bilirubin: 1 mg/dL (ref 0.3–1.2)
Total Protein: 7.4 g/dL (ref 6.5–8.1)

## 2021-06-17 LAB — URINALYSIS, ROUTINE W REFLEX MICROSCOPIC
Bilirubin Urine: NEGATIVE
Glucose, UA: NEGATIVE mg/dL
Hgb urine dipstick: NEGATIVE
Ketones, ur: NEGATIVE mg/dL
Leukocytes,Ua: NEGATIVE
Nitrite: NEGATIVE
Protein, ur: NEGATIVE mg/dL
Specific Gravity, Urine: 1.014 (ref 1.005–1.030)
pH: 7 (ref 5.0–8.0)

## 2021-06-17 LAB — LIPASE, BLOOD: Lipase: 36 U/L (ref 11–51)

## 2021-06-17 LAB — PHOSPHORUS: Phosphorus: 3.1 mg/dL (ref 2.5–4.6)

## 2021-06-17 LAB — CBC
HCT: 37.8 % (ref 36.0–46.0)
Hemoglobin: 12.2 g/dL (ref 12.0–15.0)
MCH: 27.9 pg (ref 26.0–34.0)
MCHC: 32.3 g/dL (ref 30.0–36.0)
MCV: 86.3 fL (ref 80.0–100.0)
Platelets: 185 10*3/uL (ref 150–400)
RBC: 4.38 MIL/uL (ref 3.87–5.11)
RDW: 13.6 % (ref 11.5–15.5)
WBC: 8.9 10*3/uL (ref 4.0–10.5)
nRBC: 0 % (ref 0.0–0.2)

## 2021-06-17 LAB — MAGNESIUM: Magnesium: 1.8 mg/dL (ref 1.7–2.4)

## 2021-06-17 MED ORDER — METRONIDAZOLE 500 MG/100ML IV SOLN
500.0000 mg | Freq: Once | INTRAVENOUS | Status: DC
  Filled 2021-06-17: qty 100

## 2021-06-17 MED ORDER — HYDROMORPHONE HCL 1 MG/ML IJ SOLN: 0.5000 mg | INTRAMUSCULAR | Status: DC | PRN

## 2021-06-17 MED ORDER — DILTIAZEM HCL ER COATED BEADS 120 MG PO CP24
120.0000 mg | ORAL_CAPSULE | Freq: Every day | ORAL | Status: DC
  Administered 2021-06-17 – 2021-06-20 (×4): 120 mg via ORAL
  Filled 2021-06-17 (×4): qty 1

## 2021-06-17 MED ORDER — ONDANSETRON HCL 4 MG/2ML IJ SOLN
4.0000 mg | Freq: Four times a day (QID) | INTRAMUSCULAR | Status: DC | PRN
  Administered 2021-06-18: 4 mg via INTRAVENOUS

## 2021-06-17 MED ORDER — MOMETASONE FURO-FORMOTEROL FUM 200-5 MCG/ACT IN AERO
2.0000 | INHALATION_SPRAY | Freq: Two times a day (BID) | RESPIRATORY_TRACT | Status: DC
  Administered 2021-06-17 – 2021-06-20 (×6): 2 via RESPIRATORY_TRACT
  Filled 2021-06-17: qty 8.8

## 2021-06-17 MED ORDER — ACETAMINOPHEN 650 MG RE SUPP: 650.0000 mg | Freq: Four times a day (QID) | RECTAL | Status: DC | PRN

## 2021-06-17 MED ORDER — ALBUTEROL SULFATE HFA 108 (90 BASE) MCG/ACT IN AERS: 1.0000 | INHALATION_SPRAY | RESPIRATORY_TRACT | Status: DC | PRN

## 2021-06-17 MED ORDER — ACETAMINOPHEN 325 MG PO TABS
650.0000 mg | ORAL_TABLET | Freq: Four times a day (QID) | ORAL | Status: DC | PRN
  Administered 2021-06-17 – 2021-06-20 (×6): 650 mg via ORAL
  Filled 2021-06-17 (×6): qty 2

## 2021-06-17 MED ORDER — HYOSCYAMINE SULFATE ER 0.375 MG PO TB12
0.3750 mg | ORAL_TABLET | Freq: Two times a day (BID) | ORAL | Status: DC
  Administered 2021-06-18 – 2021-06-20 (×3): 0.375 mg via ORAL
  Filled 2021-06-17 (×6): qty 1

## 2021-06-17 MED ORDER — CEFTRIAXONE SODIUM 2 G IJ SOLR
2.0000 g | Freq: Once | INTRAMUSCULAR | Status: DC
  Administered 2021-06-17: 2 g via INTRAVENOUS
  Filled 2021-06-17: qty 20

## 2021-06-17 MED ORDER — MONTELUKAST SODIUM 10 MG PO TABS
10.0000 mg | ORAL_TABLET | Freq: Every day | ORAL | Status: DC
  Administered 2021-06-17 – 2021-06-19 (×3): 10 mg via ORAL
  Filled 2021-06-17 (×3): qty 1

## 2021-06-17 MED ORDER — SERTRALINE HCL 50 MG PO TABS
50.0000 mg | ORAL_TABLET | Freq: Every day | ORAL | Status: DC
Start: 2021-06-17 — End: 2021-06-20
  Administered 2021-06-17 – 2021-06-19 (×3): 50 mg via ORAL
  Filled 2021-06-17 (×3): qty 1

## 2021-06-17 MED ORDER — KETOROLAC TROMETHAMINE 15 MG/ML IJ SOLN
15.0000 mg | Freq: Once | INTRAMUSCULAR | Status: AC
  Administered 2021-06-17: 15 mg via INTRAMUSCULAR
  Filled 2021-06-17: qty 1

## 2021-06-17 MED ORDER — PANTOPRAZOLE SODIUM 40 MG PO TBEC
40.0000 mg | DELAYED_RELEASE_TABLET | Freq: Every day | ORAL | Status: DC
  Administered 2021-06-18 – 2021-06-20 (×3): 40 mg via ORAL
  Filled 2021-06-17 (×3): qty 1

## 2021-06-17 MED ORDER — FLUTICASONE PROPIONATE 50 MCG/ACT NA SUSP
2.0000 | Freq: Every day | NASAL | Status: DC
  Administered 2021-06-18 – 2021-06-20 (×3): 2 via NASAL
  Filled 2021-06-17: qty 16

## 2021-06-17 MED ORDER — KCL IN DEXTROSE-NACL 40-5-0.9 MEQ/L-%-% IV SOLN
INTRAVENOUS | Status: DC
  Filled 2021-06-17 (×3): qty 1000

## 2021-06-17 MED ORDER — ALBUTEROL SULFATE (2.5 MG/3ML) 0.083% IN NEBU: 3.0000 mL | INHALATION_SOLUTION | Freq: Four times a day (QID) | RESPIRATORY_TRACT | Status: DC | PRN

## 2021-06-17 MED ORDER — METRONIDAZOLE 500 MG/100ML IV SOLN
500.0000 mg | Freq: Two times a day (BID) | INTRAVENOUS | Status: DC
  Administered 2021-06-18 – 2021-06-20 (×4): 500 mg via INTRAVENOUS
  Filled 2021-06-17 (×5): qty 100

## 2021-06-17 MED ORDER — ONDANSETRON 8 MG PO TBDP
8.0000 mg | ORAL_TABLET | Freq: Once | ORAL | Status: AC
  Administered 2021-06-17: 8 mg via ORAL
  Filled 2021-06-17: qty 1

## 2021-06-17 MED ORDER — SODIUM CHLORIDE 0.9 % IV SOLN
2.0000 g | INTRAVENOUS | Status: DC
  Administered 2021-06-18 – 2021-06-20 (×3): 2 g via INTRAVENOUS
  Filled 2021-06-17: qty 20
  Filled 2021-06-17: qty 2
  Filled 2021-06-17: qty 20

## 2021-06-17 MED ORDER — TRIAMTERENE-HCTZ 37.5-25 MG PO TABS
1.0000 | ORAL_TABLET | Freq: Every day | ORAL | Status: DC
  Administered 2021-06-18 – 2021-06-20 (×3): 1 via ORAL
  Filled 2021-06-17 (×3): qty 1

## 2021-06-17 NOTE — Discharge Instructions (Addendum)
Your lab tests were reassuring.  Your MRI of the gallbladder and bile duct was also okay. Please follow up with Dr. Peyton Najjar for further evaluation of your symptoms. ?

## 2021-06-17 NOTE — ED Provider Notes (Signed)
? ?[facility] ?Provider Note ? ? ? Event Date/Time  ? First MD Initiated Contact with Patient 06/17/21 [id number]   ?  (approximate) ? ? ?History  ? ?Abdominal Pain ? ? ?HPI ? ?Angela Villarreal is a 66 y.o. female  with pmh of IBS, htn, who comes to the ED c/o of acute upper abd pain that started at 4:30am today, constant, radiating around to bilateral back, assoc. With nausea. No vomiting/diarrhea/fever/chills/cp/sob. Never had pain like this before. Normal eating up through bedtime last night, no post-prandial sx.  ? ?  ? ? ?Physical Exam  ? ?Triage Vital Signs: ?ED Triage Vitals  ?Enc Vitals Group  ?   BP 06/17/21 1027 (!) 160/82  ?   Pulse Rate 06/17/21 1027 77  ?   Resp 06/17/21 1027 18  ?   Temp 06/17/21 1027 98.1 ?F (36.7 ?C)  ?   Temp Source 06/17/21 1027 Oral  ?   SpO2 06/17/21 1027 96 %  ?   Weight 06/17/21 0910 200 lb (90.7 kg)  ?   Height 06/17/21 0910 '5\' 5"'$  (1.651 m)  ?   Head Circumference --   ?   Peak Flow --   ?   Pain Score 06/17/21 0910 6  ?   Pain Loc --   ?   Pain Edu? --   ?   Excl. in [place]? --   ? ? ?Most recent vital signs: ?Vitals:  ? 06/17/21 1248 06/17/21 1400  ?BP: (!) 168/78 (!) 167/80  ?Pulse: 70 70  ?Resp: 18 18  ?Temp:    ?SpO2: 97% 97%  ? ? ? ?General: Awake, no distress.  ?CV:  Good peripheral perfusion. RRR ?Resp:  Normal effort. CTAB ?Abd:  No distention. Soft with focal RUQ ttp. No r/r/g. ?Other:  No LE tenderness/calf swelling. No rash. No jaundice. ? ? ?ED Results / Procedures / Treatments  ? ?Labs ?(all labs ordered are listed, but only abnormal results are displayed) ?Labs Reviewed  ?COMPREHENSIVE METABOLIC PANEL - Abnormal; Notable for the following components:  ?    Result Value  ? Potassium 3.0 (*)   ? Glucose, Bld 159 (*)   ? AST 183 (*)   ? ALT 77 (*)   ? All other components within normal limits  ?URINALYSIS, ROUTINE W REFLEX MICROSCOPIC - Abnormal; Notable for the following components:  ? Color, Urine YELLOW (*)   ? APPearance CLEAR (*)   ? All other  components within normal limits  ?LIPASE, BLOOD  ?CBC  ? ? ? ?EKG ? ?Interpreted by me ?Nsr, rate 78, nl axis, intervals. PRWP. Nl QRS, ST segments.  Nl T waves. ? ? ?RADIOLOGY ?[place] ruq negative for gallstones. Radiology report reviewed, which does note GB wall thickening, equivocal for cholecystitis. ? ? ? ?PROCEDURES: ? ?Critical Care performed: No ? ?Procedures ? ? ?MEDICATIONS ORDERED IN ED: ?Medications  ?cefTRIAXone (ROCEPHIN) 2 g in sodium chloride 0.9 % 100 mL IVPB (has no administration in time range)  ?metroNIDAZOLE (FLAGYL) IVPB 500 mg (has no administration in time range)  ?ondansetron (ZOFRAN-ODT) disintegrating tablet 8 mg (8 mg Oral Given 06/17/21 1033)  ?ketorolac (TORADOL) 15 MG/ML injection 15 mg (15 mg Intramuscular Given 06/17/21 1033)  ? ? ? ?IMPRESSION / MDM / ASSESSMENT AND PLAN / ED COURSE  ?I reviewed the triage vital signs and the nursing notes. ?             ?               ? ?  Differential diagnosis includes, but is not limited to, cholelithiasis, choledocholithiasis, cholecystitis, pancreatitis, gerd, passed gallstone ? ? ?Pt p/w ruq pain/tenderness. [place] "equivocal" for ccy. Case d/w surgery Dr. Peyton Najjar who eval'd pt and found symptoms to be substantially improved.  I re-examined pt, and pain and tenderness are resolved.  I d/w possibility of obtaining MRCP today and engaged in shared MDM with pt. She wishes to proceed with MRCP today. If reassuring, she will not require admission and can follow up in office with Dr. Peyton Najjar. ? ?----------------------------------------- ?3:20 PM on 06/17/2021 ?----------------------------------------- ?MRCP concerning for choledocholithiasis + cholecystitis. Will give ceftriaxone and flagyl. D/w Dr. Peyton Najjar who recommends hospitalist admission for ERCP. Dr. Allen Norris is on schedule tomorrow. ? ?  ? ? ?FINAL CLINICAL IMPRESSION(S) / ED DIAGNOSES  ? ?Final diagnoses:  ?Pain of upper abdomen  ?Choledocholithiasis with acute cholecystitis  ? ? ? ?Rx / DC Orders  ? ?ED  Discharge Orders   ? ? None  ? ?  ? ? ? ?Note:  This document was prepared using Dragon voice recognition software and may include unintentional dictation errors. ?  ?Carrie Mew, MD ?06/17/21 1520 ? ?

## 2021-06-17 NOTE — Telephone Encounter (Signed)
? ? ?  Chief Complaint: Chest, "Upper abdominal pain" ?Symptoms: 8/10 mid chest pain, sudden,moved to right below sternum now at 6/10. Nausea, chills ?Frequency: 0400, woke pt up ?Pertinent Negatives: Patient denies SOB ?Disposition: '[x]'$ ED /'[]'$ Urgent Care (no appt availability in office) / '[]'$ Appointment(In office/virtual)/ '[]'$  [facility]/ '[]'$ Home Care/ '[]'$ Refused Recommended Disposition /'[]'$ [facility]/ '[]'$  Follow-up with PCP ?Additional Notes: Advised ED, states will follow disposition. Reason for Disposition ? [1] SEVERE pain AND [2] age > 10 years ? ?Answer Assessment - Initial Assessment Questions ?1. LOCATION: "Where does it hurt?"  ?    Right below where bra hits, tender to touch, started higher up in chest ?2. RADIATION: "Does the pain shoot anywhere else?" (e.g., chest, back) ?    To back ?3. ONSET: "When did the pain begin?" (e.g., minutes, hours or days ago)  ?    0400 this AM ?4. SUDDEN: "Gradual or sudden onset?" ?    Suddenly ?5. PATTERN "Does the pain come and go, or is it constant?" ?   - If constant: "Is it getting better, staying the same, or worsening?"  ?    (Note: Constant means the pain never goes away completely; most serious pain is constant and it progresses)  ?   - If intermittent: "How long does it last?" "Do you have pain now?" ?    (Note: Intermittent means the pain goes away completely between bouts) ?    Constant ?6. SEVERITY: "How bad is the pain?"  (e.g., Scale 1-10; mild, moderate, or severe) ?  - MILD (1-3): doesn't interfere with normal activities, abdomen soft and not tender to touch  ?  - MODERATE (4-7): interferes with normal activities or awakens from sleep, abdomen tender to touch  ?  - SEVERE (8-10): excruciating pain, doubled over, unable to do any normal activities  ?    8/10 when started, now 6/10 ?7. RECURRENT SYMPTOM: "Have you ever had this type of stomach pain before?" If Yes, ask: "When was the last time?" and "What happened that time?"  ?    no ?8.  CAUSE: "What do you think is causing the stomach pain?" ?    Unsure ?9. RELIEVING/AGGRAVATING FACTORS: "What makes it better or worse?" (e.g., movement, antacids, bowel movement) ?    Heat, Pepto Bismol ?10. OTHER SYMPTOMS: "Do you have any other symptoms?" (e.g., back pain, diarrhea, fever, urination pain, vomiting) ?      Nausea, no vomiting, chills ? ?Protocols used: Abdominal Pain - Female-A-AH ? ?

## 2021-06-17 NOTE — Consult Note (Signed)
Consultation ? ?Referring Provider:     Hospitalist ?Admit date: 06/17/2021 ?Consult date: 06/17/2021         ?Reason for Consultation:     Choledolcholithiasis ?       ? HPI:   ?Angela Villarreal is a 66 y.o. lady with history of obesity, hypertension, and hypothyroidism here with abdominal pain and imaging showing choldolcholithiasis. Her pain started today in the right upper quadrant. She denied fevers but endorses chills. MRCP in the ED confirmed stone in the distal CBD. Gen surgery has already been consulted. Patient takes no blood thinners. Denies any history of gastric bypass. Sister with history of gallbladder issues. ? ? ?Past Medical History:  ?Diagnosis Date  ? Asthma   ? 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 AMP. Excised: 04/19/2009, margins free.  ? 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 Hypertension   ? Hypothyroidism   ? ? ?Past Surgical History:  ?Procedure Laterality Date  ? ABDOMINAL HYSTERECTOMY  2007  ? due to fibroids. patient reports she also had ovaries removed.   ? [place], 2000  ? BLADDER SURGERY  2001  ? Tacked  ? COLONOSCOPY WITH PROPOFOL N/A 11/27/2017  ? Procedure: COLONOSCOPY WITH PROPOFOL;  Surgeon: Jonathon Bellows, MD;  Location: [facility] ENDOSCOPY;  Service: Gastroenterology;  Laterality: N/A;  ? COLONOSCOPY WITH PROPOFOL N/A 03/26/2021  ? Procedure: COLONOSCOPY WITH PROPOFOL;  Surgeon: Jonathon Bellows, MD;  Location: [facility] ENDOSCOPY;  Service: Gastroenterology;  Laterality: N/A;  ? MELANOMA EXCISION  02/2004  ? removed from face  ? OOPHORECTOMY    ? ? ?Family History  ?Problem Relation Age of Onset  ? Colon cancer Mother   ? Squamous cell carcinoma Mother   ? Alzheimer's disease Mother   ? Cancer Mother   ?     colon  ? Non-Hodgkin's lymphoma Father   ? Thyroid disease Sister   ? Breast cancer Neg Hx   ? ? ?Social History  ? ?Tobacco Use  ? Smoking status: Never  ? Smokeless tobacco: Never  ?Vaping Use  ? Vaping Use: Never used  ?Substance Use Topics   ? Alcohol use: Yes  ?  Comment: once monthly  ? Drug use: No  ? ? ?Prior to Admission medications   ?Medication Sig Start Date End Date Taking? Authorizing Provider  ?albuterol (VENTOLIN HFA) 108 (90 Base) MCG/ACT inhaler Inhale 1-2 puffs into the lungs every 4 (four) hours as needed for wheezing or shortness of breath. 06/28/20  Yes Jerrol Banana., MD  ?ALLERGY RELIEF 10 MG tablet TAKE 1 TABLET BY MOUTH DAILY. 03/15/21  Yes Bacigalupo, Dionne Bucy, MD  ?diltiazem (CARDIZEM CD) 120 MG 24 hr capsule TAKE 1 CAPSULE (120 MG) BY MOUTH EVERY DAY 03/15/21  Yes Bacigalupo, Dionne Bucy, MD  ?fluticasone (FLONASE) 50 MCG/ACT nasal spray Place 2 sprays into both nostrils daily. 06/22/20  Yes [provider]  ?fluticasone-salmeterol (ADVAIR HFA) 115-21 MCG/ACT inhaler Inhale 2 puffs into the lungs 2 (two) times daily.   Yes [provider]  ?hyoscyamine (LEVBID) 0.375 MG 12 hr tablet TAKE ONE TABLET BY MOUTH TWICE DAILY 02/21/21  Yes Bacigalupo, Dionne Bucy, MD  ?levothyroxine (SYNTHROID) 100 MCG tablet Take 1 tablet (100 mcg total) by mouth daily. 11/05/20  Yes Bacigalupo, Dionne Bucy, MD  ?montelukast (SINGULAIR) 10 MG tablet Take 1 tablet (10 mg total) by mouth at bedtime. 09/07/15  Yes Margarita Rana, MD  ?naproxen sodium (ALEVE) 220 MG tablet Take  220 mg by mouth 2 (two) times daily as needed.   Yes [provider]  ?omeprazole (PRILOSEC) 40 MG capsule Take 40 mg by mouth daily. 12/27/18  Yes [provider]  ?potassium chloride SA (KLOR-CON) 20 MEQ tablet TAKE ONE TABLET TWICE DAILY 12/07/20  Yes Bacigalupo, Dionne Bucy, MD  ?rosuvastatin (CRESTOR) 5 MG tablet TAKE 1 TABLET BY MOUTH DAILY 03/15/21  Yes Bacigalupo, Dionne Bucy, MD  ?sertraline (ZOLOFT) 50 MG tablet TAKE 1 TABLET BY MOUTH DAILY. 05/17/21  Yes Bacigalupo, Dionne Bucy, MD  ?triamterene-hydrochlorothiazide (MAXZIDE-25) 37.5-25 MG tablet TAKE 1 TABLET BY MOUTH DAILY 03/15/21  Yes Bacigalupo, Dionne Bucy, MD  ?FLUTICASONE PROPIONATE, NASAL, NA Place  into the nose.    [provider]  ? ? ?Current Facility-Administered Medications  ?Medication Dose Route Frequency Provider Last Rate Last Admin  ? cefTRIAXone (ROCEPHIN) 2 g in sodium chloride 0.9 % 100 mL IVPB  2 g Intravenous Once Carrie Mew, MD 200 mL/hr at 06/17/21 1636 2 g at 06/17/21 1636  ? dextrose 5 % and 0.9 % NaCl with KCl 40 mEq/L infusion   Intravenous Continuous Jennye Boroughs, MD      ? metroNIDAZOLE (FLAGYL) IVPB 500 mg  500 mg Intravenous Once Carrie Mew, MD      ? ondansetron [facility]) injection 4 mg  4 mg Intravenous Q6H PRN Jennye Boroughs, MD      ? ?Current Outpatient Medications  ?Medication Sig Dispense Refill  ? albuterol (VENTOLIN HFA) 108 (90 Base) MCG/ACT inhaler Inhale 1-2 puffs into the lungs every 4 (four) hours as needed for wheezing or shortness of breath. 3 each 3  ? ALLERGY RELIEF 10 MG tablet TAKE 1 TABLET BY MOUTH DAILY. 30 tablet 11  ? diltiazem (CARDIZEM CD) 120 MG 24 hr capsule TAKE 1 CAPSULE (120 MG) BY MOUTH EVERY DAY 90 capsule 0  ? fluticasone (FLONASE) 50 MCG/ACT nasal spray Place 2 sprays into both nostrils daily.    ? fluticasone-salmeterol (ADVAIR HFA) 115-21 MCG/ACT inhaler Inhale 2 puffs into the lungs 2 (two) times daily.    ? hyoscyamine (LEVBID) 0.375 MG 12 hr tablet TAKE ONE TABLET BY MOUTH TWICE DAILY 60 tablet 3  ? levothyroxine (SYNTHROID) 100 MCG tablet Take 1 tablet (100 mcg total) by mouth daily. 90 tablet 3  ? montelukast (SINGULAIR) 10 MG tablet Take 1 tablet (10 mg total) by mouth at bedtime. 90 tablet 1  ? naproxen sodium (ALEVE) 220 MG tablet Take 220 mg by mouth 2 (two) times daily as needed.    ? omeprazole (PRILOSEC) 40 MG capsule Take 40 mg by mouth daily.    ? potassium chloride SA (KLOR-CON) 20 MEQ tablet TAKE ONE TABLET TWICE DAILY 180 tablet 2  ? rosuvastatin (CRESTOR) 5 MG tablet TAKE 1 TABLET BY MOUTH DAILY 90 tablet 1  ? sertraline (ZOLOFT) 50 MG tablet TAKE 1 TABLET BY MOUTH DAILY. 90 tablet 0  ?  triamterene-hydrochlorothiazide (MAXZIDE-25) 37.5-25 MG tablet TAKE 1 TABLET BY MOUTH DAILY 90 tablet 0  ? FLUTICASONE PROPIONATE, NASAL, NA Place into the nose.    ? ? ?Allergies as of 06/17/2021 - Review Complete 06/17/2021  ?Allergen Reaction Noted  ? Amoxicillin  01/05/2021  ? Gentamicin sulfate  08/17/2014  ? Lisinopril Cough 11/02/2020  ? ? ? ?Review of Systems:    ?All systems reviewed and negative except where noted in HPI. ? ?Review of Systems  ?Constitutional:  Positive for chills. Negative for fever.  ?Respiratory:  Negative for shortness of breath.   ?  Cardiovascular:  Negative for chest pain.  ?Gastrointestinal:  Negative for blood in stool.  ?Genitourinary:  Negative for dysuria.  ?Musculoskeletal:  Negative for joint pain.  ?Skin:  Negative for rash.  ?Neurological:  Negative for focal weakness.  ?Psychiatric/Behavioral:  Negative for substance abuse.   ?All other systems reviewed and are negative.  ? ? ? Physical Exam:  ?Vital signs in last 24 hours: ?Temp:  [98.1 ?F (36.7 ?C)-98.4 ?F (36.9 ?C)] 98.4 ?F (36.9 ?C) (04/03 1625) ?Pulse Rate:  [70-77] 72 (04/03 1625) ?Resp:  [18] 18 (04/03 1625) ?BP: (160-168)/(78-82) 160/78 (04/03 1625) ?SpO2:  [96 %-98 %] 98 % (04/03 1625) ?Weight:  [90.7 kg] 90.7 kg (04/03 0910) ?  ?General:   Pleasant in NAD ?Head:  Normocephalic and atraumatic. ?Eyes:   No icterus.   Conjunctiva pink. ?Mouth: Mucosa pink moist, no lesions. ?Neck:  Supple; no masses felt ?Lungs:  No respiratory distress ?Heart:  RRR ?Abdomen:   Flat, soft, nondistended, nontender ?Rectal:  Not performed.  ?Msk:  MAEW x4, No clubbing or cyanosis. Strength 5/5. Symmetrical without gross deformities. ?Neurologic:  Alert and  oriented x4;  Cranial nerves II-XII intact.  ?Skin:  Warm, dry, pink without significant lesions or rashes. ?Psych:  Alert and cooperative. Normal affect. ? ?LAB RESULTS: ?Recent Labs  ?  06/17/21 ?0912  ?WBC 8.9  ?HGB 12.2  ?HCT 37.8  ?PLT 185  ? ?BMET ?Recent Labs  ?   06/17/21 ?0912  ?NA 138  ?K 3.0*  ?CL 101  ?CO2 28  ?GLUCOSE 159*  ?BUN 19  ?CREATININE 0.79  ?CALCIUM 9.1  ? ?LFT ?Recent Labs  ?  06/17/21 ?0912  ?PROT 7.4  ?ALBUMIN 4.1  ?AST 183*  ?ALT 77*  ?ALKPHOS 105  ?BILITOT 1.0  ? ?PT/INR ?

## 2021-06-17 NOTE — ED Notes (Signed)
See triage note  presents with some abd pain  states pain wraps around her abd  positive nausea  denies any fever ?

## 2021-06-17 NOTE — Consult Note (Signed)
SURGICAL CONSULTATION NOTE  ? ?HISTORY OF PRESENT ILLNESS (HPI):  ?66 y.o. female presented to [facility] ED for evaluation of abdominal pain. Patient reports she started having abdominal pain since early this morning.  He initially she thought that he was coming from the chest but then the pain felt in the upper abdomen like a belt back concentrating mainly on the right upper abdomen that radiated to the back to both sides of the abdomen.  Of note, at the moment of my evaluation the pain has resolved. ? ?At the ED she was found with abdominal tenderness by the ED physician and right upper quadrant.  White blood cell count of 8.9.  There was normal bilirubin levels normal alkaline phosphatase with mildly elevated AST/ALT.  Abdominal ultrasound shows distended gallbladder without gallstones or sludge.  Common bile duct was 0.8 cm. ? ?Surgery is consulted by Dr. Joni Fears in this context for evaluation and management of concern of cholecystitis. ? ?PAST MEDICAL HISTORY (PMH):  ?Past Medical History:  ?Diagnosis Date  ? Asthma   ? 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 AMP. Excised: 04/19/2009, margins free.  ? 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 Hypertension   ? Hypothyroidism   ?  ? ?PAST SURGICAL HISTORY ([place]):  ?Past Surgical History:  ?Procedure Laterality Date  ? ABDOMINAL HYSTERECTOMY  2007  ? due to fibroids. patient reports she also had ovaries removed.   ? [place], 2000  ? BLADDER SURGERY  2001  ? Tacked  ? COLONOSCOPY WITH PROPOFOL N/A 11/27/2017  ? Procedure: COLONOSCOPY WITH PROPOFOL;  Surgeon: Jonathon Bellows, MD;  Location: [facility] ENDOSCOPY;  Service: Gastroenterology;  Laterality: N/A;  ? COLONOSCOPY WITH PROPOFOL N/A 03/26/2021  ? Procedure: COLONOSCOPY WITH PROPOFOL;  Surgeon: Jonathon Bellows, MD;  Location: [facility] ENDOSCOPY;  Service: Gastroenterology;  Laterality: N/A;  ? MELANOMA EXCISION  02/2004  ? removed from face  ? OOPHORECTOMY    ?  ? ?MEDICATIONS:  ?Prior  to Admission medications   ?Medication Sig Start Date End Date Taking? Authorizing Provider  ?albuterol (VENTOLIN HFA) 108 (90 Base) MCG/ACT inhaler Inhale 1-2 puffs into the lungs every 4 (four) hours as needed for wheezing or shortness of breath. 06/28/20   Jerrol Banana., MD  ?ALLERGY RELIEF 10 MG tablet TAKE 1 TABLET BY MOUTH DAILY. 03/15/21   Virginia Crews, MD  ?diltiazem (CARDIZEM CD) 120 MG 24 hr capsule TAKE 1 CAPSULE (120 MG) BY MOUTH EVERY DAY 03/15/21   Bacigalupo, Dionne Bucy, MD  ?fluticasone (FLONASE) 50 MCG/ACT nasal spray Place 2 sprays into both nostrils daily. 06/22/20   [provider]  ?FLUTICASONE PROPIONATE, NASAL, NA Place into the nose.    [provider]  ?fluticasone-salmeterol (ADVAIR HFA) 115-21 MCG/ACT inhaler Inhale 2 puffs into the lungs 2 (two) times daily.    [provider]  ?hyoscyamine (LEVBID) 0.375 MG 12 hr tablet TAKE ONE TABLET BY MOUTH TWICE DAILY 02/21/21   Virginia Crews, MD  ?levothyroxine (SYNTHROID) 100 MCG tablet Take 1 tablet (100 mcg total) by mouth daily. 11/05/20   Virginia Crews, MD  ?montelukast (SINGULAIR) 10 MG tablet Take 1 tablet (10 mg total) by mouth at bedtime. 09/07/15   Margarita Rana, MD  ?naproxen sodium (ALEVE) 220 MG tablet Take 220 mg by mouth 2 (two) times daily as needed.    [provider]  ?omeprazole (PRILOSEC) 40 MG capsule Take 40 mg by mouth daily. 12/27/18  [provider]  ?potassium chloride SA (KLOR-CON) 20 MEQ tablet TAKE ONE TABLET TWICE DAILY 12/07/20   Virginia Crews, MD  ?rosuvastatin (CRESTOR) 5 MG tablet TAKE 1 TABLET BY MOUTH DAILY 03/15/21   Virginia Crews, MD  ?sertraline (ZOLOFT) 50 MG tablet TAKE 1 TABLET BY MOUTH DAILY. 05/17/21   Virginia Crews, MD  ?triamterene-hydrochlorothiazide (MAXZIDE-25) 37.5-25 MG tablet TAKE 1 TABLET BY MOUTH DAILY 03/15/21   Bacigalupo, Dionne Bucy, MD  ?  ? ?ALLERGIES:  ?Allergies  ?Allergen Reactions  ? Amoxicillin   ?   Other reaction(s): Rash, Hives ()  ? Gentamicin Sulfate   ? Lisinopril Cough  ?  ? ?SOCIAL HISTORY:  ?Social History  ? ?Socioeconomic History  ? Marital status: Divorced  ?  Spouse name: Not on file  ? Number of children: Not on file  ? Years of education: Not on file  ? Highest education level: Not on file  ?Occupational History  ? Not on file  ?Tobacco Use  ? Smoking status: Never  ? Smokeless tobacco: Never  ?Vaping Use  ? Vaping Use: Never used  ?Substance and Sexual Activity  ? Alcohol use: Yes  ?  Comment: once monthly  ? Drug use: No  ? Sexual activity: Not on file  ?Other Topics Concern  ? Not on file  ?Social History Narrative  ? Not on file  ? ?Social Determinants of Health  ? ?Financial Resource Strain: Not on file  ?Food Insecurity: Not on file  ?Transportation Needs: Not on file  ?Physical Activity: Not on file  ?Stress: Not on file  ?Social Connections: Not on file  ?Intimate Partner Violence: Not on file  ?  ? ? ?FAMILY HISTORY:  ?Family History  ?Problem Relation Age of Onset  ? Colon cancer Mother   ? Squamous cell carcinoma Mother   ? Alzheimer's disease Mother   ? Cancer Mother   ?     colon  ? Non-Hodgkin's lymphoma Father   ? Thyroid disease Sister   ? Breast cancer Neg Hx   ?  ? ?REVIEW OF SYSTEMS:  ?Constitutional: denies weight loss, fever, chills, or sweats  ?Eyes: denies any other vision changes, history of eye injury  ?ENT: denies sore throat, hearing problems  ?Respiratory: denies shortness of breath, wheezing  ?Cardiovascular: denies chest pain, palpitations  ?Gastrointestinal: Positive abdominal pain ?Genitourinary: denies burning with urination or urinary frequency ?Musculoskeletal: denies any other joint pains or cramps  ?Skin: denies any other rashes or skin discolorations  ?Neurological: denies any other headache, dizziness, weakness  ?Psychiatric: denies any other depression, anxiety  ? ?All other review of systems were negative  ? ?VITAL SIGNS:  ?Temp:  [98.1 ?F (36.7 ?C)] 98.1  ?F (36.7 ?C) (04/03 1027) ?Pulse Rate:  [70-77] 70 (04/03 1248) ?Resp:  [18] 18 (04/03 1248) ?BP: (160-168)/(78-82) 168/78 (04/03 1248) ?SpO2:  [96 %-97 %] 97 % (04/03 1248) ?Weight:  [90.7 kg] 90.7 kg (04/03 0910)     Height: '5\' 5"'$  (165.1 cm) Weight: 90.7 kg BMI (Calculated): 33.28  ? ?INTAKE/OUTPUT:  ?This shift: No intake/output data recorded.  ?Last 2 shifts: '@IOLAST2SHIFTS'$ @  ? ?PHYSICAL EXAM:  ?Constitutional:  ?-- Normal body habitus  ?-- Awake, alert, and oriented x3  ?Eyes:  ?-- Pupils equally round and reactive to light  ?-- No scleral icterus  ?Ear, nose, and throat:  ?-- No jugular venous distension  ?Pulmonary:  ?-- No crackles  ?-- Equal breath sounds bilaterally ?-- Breathing non-labored at rest ?  Cardiovascular:  ?-- S1, S2 present  ?-- No pericardial rubs ?Gastrointestinal:  ?-- Abdomen soft, nontender, non-distended, no guarding or rebound tenderness ?-- No abdominal masses appreciated, pulsatile or otherwise  ?Musculoskeletal and Integumentary:  ?-- Wounds: None appreciated ?-- Extremities: B/L UE and LE FROM, hands and feet warm, no edema  ?Neurologic:  ?-- Motor function: intact and symmetric ?-- Sensation: intact and symmetric ? ? ?Labs:  ? ?  Latest Ref Rng & Units 06/17/2021  ?  9:12 AM 09/05/2015  ? 10:53 AM 08/16/2014  ? 12:00 AM  ?CBC  ?WBC 4.0 - 10.5 K/uL 8.9   7.1   6.6    ?Hemoglobin 12.0 - 15.0 g/dL 12.2   12.5   12.5    ?Hematocrit 36.0 - 46.0 % 37.8   36.4   37    ?Platelets 150 - 400 K/uL 185   241   235    ? ? ?  Latest Ref Rng & Units 06/17/2021  ?  9:12 AM 02/15/2021  ? 11:11 AM 08/09/2020  ?  4:06 PM  ?CMP  ?Glucose 70 - 99 mg/dL 159   105   103    ?BUN 8 - 23 mg/dL '19   15   19    '$ ?Creatinine 0.44 - 1.00 mg/dL 0.79   0.96   1.01    ?Sodium 135 - 145 mmol/L 138   141   141    ?Potassium 3.5 - 5.1 mmol/L 3.0   3.7   4.1    ?Chloride 98 - 111 mmol/L 101   101   103    ?CO2 22 - 32 mmol/L '28   25   21    '$ ?Calcium 8.9 - 10.3 mg/dL 9.1   9.4   9.2    ?Total Protein 6.5 - 8.1 g/dL 7.4       ?Total Bilirubin 0.3 - 1.2 mg/dL 1.0      ?Alkaline Phos 38 - 126 U/L 105      ?AST 15 - 41 U/L 183      ?ALT 0 - 44 U/L 77      ? ? ? ?Imaging studies:  ?EXAM: ?ULTRASOUND ABDOMEN LIMITED RIGHT UPPER QUADRA

## 2021-06-17 NOTE — ED Triage Notes (Signed)
Pt via POV from home. Pt c/o upper abd pain and wraps around to her back that started 04:30 this AM. Pt endorse nausea. Pt still has gallbladder and appendix. Pt is A&OX4 and NAD.  ?

## 2021-06-17 NOTE — H&P (Addendum)
? ?History and Physical:  ? ? ?Angela Villarreal  ? ?VQM:[id number] DOB: 1955/05/01 DOA: 06/17/2021 ? ?Referring MD/provider: Brenton Grills, MD ?PCP: Virginia Crews, MD  ? ?Patient coming from: Home ? ?Chief Complaint: Abdominal pain ? ?History of Present Illness:  ? ?Angela Villarreal is a 66 y.o. female with medical history significant for melanoma, hypothyroidism, hypertension, asthma, who presented to the hospital with severe abdominal pain.  Abdominal pain started earlier this morning around 4 AM when it woke her up from sleep.  It was mainly located in the upper abdomen.  It started in the epigastric area and then spread to the left and right upper abdomen.  Eventually, pain "wrapped around to her back ".  It was associated with nausea and there were no known relieving or aggravating factors.  She got some relief when she was given IV Toradol in the emergency room. ? ?ED Course:  The patient was given IV Zofran, IV Toradol, IV ceftriaxone and IV Flagyl in the ED. ? ?ROS:  ? ?ROS all other systems reviewed were negative ? ?Past Medical History:  ? ?Past Medical History:  ?Diagnosis Date  ? Asthma   ? 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 AMP. Excised: 04/19/2009, margins free.  ? 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 Hypertension   ? Hypothyroidism   ? ? ?Past Surgical History:  ? ?Past Surgical History:  ?Procedure Laterality Date  ? ABDOMINAL HYSTERECTOMY  2007  ? due to fibroids. patient reports she also had ovaries removed.   ? [place], 2000  ? BLADDER SURGERY  2001  ? Tacked  ? COLONOSCOPY WITH PROPOFOL N/A 11/27/2017  ? Procedure: COLONOSCOPY WITH PROPOFOL;  Surgeon: Jonathon Bellows, MD;  Location: [facility] ENDOSCOPY;  Service: Gastroenterology;  Laterality: N/A;  ? COLONOSCOPY WITH PROPOFOL N/A 03/26/2021  ? Procedure: COLONOSCOPY WITH PROPOFOL;  Surgeon: Jonathon Bellows, MD;  Location: [facility] ENDOSCOPY;  Service: Gastroenterology;  Laterality: N/A;  ? MELANOMA  EXCISION  02/2004  ? removed from face  ? OOPHORECTOMY    ? ? ?Social History:  ? ?Social History  ? ?Socioeconomic History  ? Marital status: Divorced  ?  Spouse name: Not on file  ? Number of children: Not on file  ? Years of education: Not on file  ? Highest education level: Not on file  ?Occupational History  ? Not on file  ?Tobacco Use  ? Smoking status: Never  ? Smokeless tobacco: Never  ?Vaping Use  ? Vaping Use: Never used  ?Substance and Sexual Activity  ? Alcohol use: Yes  ?  Comment: once monthly  ? Drug use: No  ? Sexual activity: Not on file  ?Other Topics Concern  ? Not on file  ?Social History Narrative  ? Not on file  ? ?Social Determinants of Health  ? ?Financial Resource Strain: Not on file  ?Food Insecurity: Not on file  ?Transportation Needs: Not on file  ?Physical Activity: Not on file  ?Stress: Not on file  ?Social Connections: Not on file  ?Intimate Partner Violence: Not on file  ? ? ?Allergies  ? ?Amoxicillin, Gentamicin sulfate, and Lisinopril ? ?Family history:  ? ?Family History  ?Problem Relation Age of Onset  ? Colon cancer Mother   ? Squamous cell carcinoma Mother   ? Alzheimer's disease Mother   ? Cancer Mother   ?     colon  ? Non-Hodgkin's lymphoma Father   ? Thyroid disease Sister   ?  Breast cancer Neg Hx   ? ? ?Current Medications:  ? ?Prior to Admission medications   ?Medication Sig Start Date End Date Taking? Authorizing Provider  ?albuterol (VENTOLIN HFA) 108 (90 Base) MCG/ACT inhaler Inhale 1-2 puffs into the lungs every 4 (four) hours as needed for wheezing or shortness of breath. 06/28/20  Yes Jerrol Banana., MD  ?ALLERGY RELIEF 10 MG tablet TAKE 1 TABLET BY MOUTH DAILY. 03/15/21  Yes Bacigalupo, Dionne Bucy, MD  ?diltiazem (CARDIZEM CD) 120 MG 24 hr capsule TAKE 1 CAPSULE (120 MG) BY MOUTH EVERY DAY 03/15/21  Yes Bacigalupo, Dionne Bucy, MD  ?fluticasone (FLONASE) 50 MCG/ACT nasal spray Place 2 sprays into both nostrils daily. 06/22/20  Yes [provider]   ?fluticasone-salmeterol (ADVAIR HFA) 115-21 MCG/ACT inhaler Inhale 2 puffs into the lungs 2 (two) times daily.   Yes [provider]  ?hyoscyamine (LEVBID) 0.375 MG 12 hr tablet TAKE ONE TABLET BY MOUTH TWICE DAILY 02/21/21  Yes Bacigalupo, Dionne Bucy, MD  ?levothyroxine (SYNTHROID) 100 MCG tablet Take 1 tablet (100 mcg total) by mouth daily. 11/05/20  Yes Bacigalupo, Dionne Bucy, MD  ?montelukast (SINGULAIR) 10 MG tablet Take 1 tablet (10 mg total) by mouth at bedtime. 09/07/15  Yes Margarita Rana, MD  ?naproxen sodium (ALEVE) 220 MG tablet Take 220 mg by mouth 2 (two) times daily as needed.   Yes [provider]  ?omeprazole (PRILOSEC) 40 MG capsule Take 40 mg by mouth daily. 12/27/18  Yes [provider]  ?potassium chloride SA (KLOR-CON) 20 MEQ tablet TAKE ONE TABLET TWICE DAILY 12/07/20  Yes Bacigalupo, Dionne Bucy, MD  ?rosuvastatin (CRESTOR) 5 MG tablet TAKE 1 TABLET BY MOUTH DAILY 03/15/21  Yes Bacigalupo, Dionne Bucy, MD  ?sertraline (ZOLOFT) 50 MG tablet TAKE 1 TABLET BY MOUTH DAILY. 05/17/21  Yes Bacigalupo, Dionne Bucy, MD  ?triamterene-hydrochlorothiazide (MAXZIDE-25) 37.5-25 MG tablet TAKE 1 TABLET BY MOUTH DAILY 03/15/21  Yes Bacigalupo, Dionne Bucy, MD  ?FLUTICASONE PROPIONATE, NASAL, NA Place into the nose.    [provider]  ? ? ?Physical Exam:  ? ?Vitals:  ? 06/17/21 1027 06/17/21 1248 06/17/21 1400 06/17/21 1625  ?BP: (!) 160/82 (!) 168/78 (!) 167/80 (!) 160/78  ?Pulse: 77 70 70 72  ?Resp: '18 18 18 18  '$ ?Temp: 98.1 ?F (36.7 ?C)   98.4 ?F (36.9 ?C)  ?TempSrc: Oral   Oral  ?SpO2: 96% 97% 97% 98%  ?Weight:      ?Height:      ? ? ? ?Physical Exam: ?Blood pressure (!) 160/78, pulse 72, temperature 98.4 ?F (36.9 ?C), temperature source Oral, resp. rate 18, height '5\' 5"'$  (1.651 m), weight 90.7 kg, SpO2 98 %. ?Gen: No acute distress. ?Head: Normocephalic, atraumatic. ?Eyes: Pupils equal, round and reactive to light. Extraocular movements intact.  Sclerae nonicteric.  ?Mouth: Dry mucous  membranes ?Neck: Supple, no thyromegaly, no lymphadenopathy, no jugular venous distention. ?Chest: Lungs are clear to auscultation with good air movement. No rales, rhonchi or wheezes.  ?CV: Heart sounds are regular with an S1, S2. No murmurs, rubs or gallops.  ?Abdomen: Soft, epigastric tenderness but no rebound tenderness or guarding, obese with normal active bowel sounds. No palpable masses. ?Extremities: Extremities are without clubbing, or cyanosis. No edema.  ?Skin: Warm and dry. No rashes, lesions or wounds ?Neuro: Alert and oriented times 3; grossly nonfocal.  ?Psych: Insight is good and judgment is appropriate. Mood and affect normal. ? ? ?Data Review:  ? ? ?Labs: ?Basic Metabolic Panel: ?Recent Labs  ?  Lab 06/17/21 ?0912  ?NA 138  ?K 3.0*  ?CL 101  ?CO2 28  ?GLUCOSE 159*  ?BUN 19  ?CREATININE 0.79  ?CALCIUM 9.1  ?MG 1.8  ?PHOS 3.1  ? ?Liver Function Tests: ?Recent Labs  ?Lab 06/17/21 ?0912  ?AST 183*  ?ALT 77*  ?ALKPHOS 105  ?BILITOT 1.0  ?PROT 7.4  ?ALBUMIN 4.1  ? ?Recent Labs  ?Lab 06/17/21 ?0912  ?LIPASE 36  ? ?No results for input(s): AMMONIA in the last 168 hours. ?CBC: ?Recent Labs  ?Lab 06/17/21 ?0912  ?WBC 8.9  ?HGB 12.2  ?HCT 37.8  ?MCV 86.3  ?PLT 185  ? ?Cardiac Enzymes: ?No results for input(s): CKTOTAL, CKMB, CKMBINDEX, TROPONINI in the last 168 hours. ? ?BNP (last 3 results) ?No results for input(s): PROBNP in the last 8760 hours. ?CBG: ?No results for input(s): GLUCAP in the last 168 hours. ? ?Urinalysis ?   ?Component Value Date/Time  ? COLORURINE YELLOW (A) 06/17/2021 1034  ? APPEARANCEUR CLEAR (A) 06/17/2021 1034  ? LABSPEC 1.014 06/17/2021 1034  ? PHURINE 7.0 06/17/2021 1034  ? GLUCOSEU NEGATIVE 06/17/2021 1034  ? [place] NEGATIVE 06/17/2021 1034  ? [place] NEGATIVE 06/17/2021 1034  ? BILIRUBINUR negative 01/16/2017 1115  ? [place] NEGATIVE 06/17/2021 1034  ? [place] NEGATIVE 06/17/2021 1034  ? UROBILINOGEN 0.2 01/16/2017 1115  ? NITRITE NEGATIVE 06/17/2021 1034  ? LEUKOCYTESUR  NEGATIVE 06/17/2021 1034  ? ? ? ? ?Radiographic Studies: ?MR ABDOMEN MRCP WO CONTRAST ? ?Result Date: 06/17/2021 ?CLINICAL DATA:  Right upper quadrant pain EXAM: MRI ABDOMEN WITHOUT CONTRAST  (INCLUDING MRCP) TECHNIQUE: Multip

## 2021-06-17 NOTE — Telephone Encounter (Signed)
Noted  

## 2021-06-18 ENCOUNTER — Inpatient Hospital Stay: Payer: Medicare PPO

## 2021-06-18 ENCOUNTER — Inpatient Hospital Stay: Payer: Medicare PPO | Admitting: Anesthesiology

## 2021-06-18 ENCOUNTER — Encounter
Admission: EM | Disposition: A | Discharge status: Home / Self Care | Payer: Self-pay | Source: Home / Self Care | Attending: Internal Medicine

## 2021-06-18 ENCOUNTER — Encounter: Payer: Self-pay | Admitting: Internal Medicine

## 2021-06-18 DIAGNOSIS — E876 Hypokalemia: Secondary | ICD-10-CM | POA: Diagnosis not present

## 2021-06-18 DIAGNOSIS — K8050 Calculus of bile duct without cholangitis or cholecystitis without obstruction: Secondary | ICD-10-CM | POA: Diagnosis not present

## 2021-06-18 DIAGNOSIS — K810 Acute cholecystitis: Secondary | ICD-10-CM | POA: Diagnosis not present

## 2021-06-18 HISTORY — PX: ERCP: SHX5425

## 2021-06-18 LAB — COMPREHENSIVE METABOLIC PANEL
ALT: 511 U/L — ABNORMAL HIGH (ref 0–44)
AST: 404 U/L — ABNORMAL HIGH (ref 15–41)
Albumin: 3.4 g/dL — ABNORMAL LOW (ref 3.5–5.0)
Alkaline Phosphatase: 123 U/L (ref 38–126)
Anion gap: 8 (ref 5–15)
BUN: 16 mg/dL (ref 8–23)
CO2: 27 mmol/L (ref 22–32)
Calcium: 8.3 mg/dL — ABNORMAL LOW (ref 8.9–10.3)
Chloride: 103 mmol/L (ref 98–111)
Creatinine, Ser: 0.89 mg/dL (ref 0.44–1.00)
GFR, Estimated: >60 mL/min (ref >60)
Glucose, Bld: 129 mg/dL — ABNORMAL HIGH (ref 70–99)
Potassium: 3.6 mmol/L (ref 3.5–5.1)
Sodium: 138 mmol/L (ref 135–145)
Total Bilirubin: 1.8 mg/dL — ABNORMAL HIGH (ref 0.3–1.2)
Total Protein: 6.3 g/dL — ABNORMAL LOW (ref 6.5–8.1)

## 2021-06-18 LAB — HIV ANTIBODY (ROUTINE TESTING W REFLEX): HIV Screen 4th Generation wRfx: NONREACTIVE

## 2021-06-18 LAB — CBC
HCT: 33.2 % — ABNORMAL LOW (ref 36.0–46.0)
Hemoglobin: 10.8 g/dL — ABNORMAL LOW (ref 12.0–15.0)
MCH: 28 pg (ref 26.0–34.0)
MCHC: 32.5 g/dL (ref 30.0–36.0)
MCV: 86 fL (ref 80.0–100.0)
Platelets: 159 10*3/uL (ref 150–400)
RBC: 3.86 MIL/uL — ABNORMAL LOW (ref 3.87–5.11)
RDW: 14.2 % (ref 11.5–15.5)
WBC: 7.9 10*3/uL (ref 4.0–10.5)
nRBC: 0 % (ref 0.0–0.2)

## 2021-06-18 SURGERY — ERCP, WITH INTERVENTION IF INDICATED
Anesthesia: General

## 2021-06-18 MED ORDER — LIDOCAINE HCL (CARDIAC) PF 100 MG/5ML IV SOSY
PREFILLED_SYRINGE | INTRAVENOUS | Status: DC | PRN
  Administered 2021-06-18: 80 mg via INTRAVENOUS

## 2021-06-18 MED ORDER — DEXMEDETOMIDINE (PRECEDEX) IN NS 20 MCG/5ML (4 MCG/ML) IV SYRINGE
PREFILLED_SYRINGE | INTRAVENOUS | Status: DC | PRN
  Administered 2021-06-18 (×2): 8 ug via INTRAVENOUS

## 2021-06-18 MED ORDER — PROPOFOL 500 MG/50ML IV EMUL
INTRAVENOUS | Status: DC | PRN
  Administered 2021-06-18: 140 ug/kg/min via INTRAVENOUS

## 2021-06-18 MED ORDER — DIPHENHYDRAMINE HCL 50 MG/ML IJ SOLN
12.5000 mg | Freq: Once | INTRAMUSCULAR | Status: AC
  Administered 2021-06-18: 12.5 mg via INTRAVENOUS
  Filled 2021-06-18: qty 1

## 2021-06-18 MED ORDER — INDOMETHACIN 50 MG RE SUPP
100.0000 mg | Freq: Once | RECTAL | Status: AC
  Administered 2021-06-18: 100 mg via RECTAL

## 2021-06-18 MED ORDER — INDOMETHACIN 50 MG RE SUPP
RECTAL | Status: AC
  Filled 2021-06-18: qty 2

## 2021-06-18 MED ORDER — GLYCOPYRROLATE 0.2 MG/ML IJ SOLN
INTRAMUSCULAR | Status: DC | PRN
Start: 2021-06-18 — End: 2021-06-18
  Administered 2021-06-18: .2 mg via INTRAVENOUS

## 2021-06-18 MED ORDER — PROPOFOL 10 MG/ML IV BOLUS
INTRAVENOUS | Status: DC | PRN
  Administered 2021-06-18: 70 mg via INTRAVENOUS

## 2021-06-18 NOTE — Transfer of Care (Signed)
Immediate Anesthesia Transfer of Care Note ? ?Patient: Angela Villarreal ? ?Procedure(s) Performed: ENDOSCOPIC RETROGRADE CHOLANGIOPANCREATOGRAPHY (ERCP) ? ?Patient Location: PACU ? ?Anesthesia Type:General ? ?Level of Consciousness: awake, alert  and oriented ? ?Airway & Oxygen Therapy: Patient Spontanous Breathing ? ?Post-op Assessment: Report given to RN and Post -op Vital signs reviewed and stable ? ?Post vital signs: Reviewed and stable ? ?Last Vitals:  ?Vitals Value Taken Time  ?BP    ?Temp    ?Pulse    ?Resp    ?SpO2    ? ? ?Last Pain:  ?Vitals:  ? 06/18/21 1035  ?TempSrc: Temporal  ?PainSc: 0-No pain  ?   ? ?  ? ?Complications: No notable events documented. ?

## 2021-06-18 NOTE — Progress Notes (Signed)
Patient ID: Angela Villarreal, female   DOB: 1955-09-06, 66 y.o.   MRN: [id number] ?    SURGICAL PROGRESS NOTE  ? ?Hospital Day(s): 1.  ? ?Interval History: Patient seen and examined, no acute events or new complaints overnight. Patient reports feeling well.  Patient had ERCP today.  She endorses that she tolerated the procedure well.  She has mild soreness on the right upper quadrant.  No pain radiation.  She cannot identify any alleviating or aggravating factors. ? ?Vital signs in last 24 hours: [min-max] current  ?Temp:  [97.2 ?F (36.2 ?C)-100.1 ?F (37.8 ?C)] 97.8 ?F (36.6 ?C) (04/04 1539) ?Pulse Rate:  [72-112] 72 (04/04 1539) ?Resp:  [14-22] 22 (04/04 1205) ?BP: (108-167)/(62-82) 163/77 (04/04 1539) ?SpO2:  [92 %-100 %] 95 % (04/04 1539) ?Weight:  [90.7 kg] 90.7 kg (04/04 1035)     Height: '5\' 5"'$  (165.1 cm) Weight: 90.7 kg BMI (Calculated): 33.27  ? ?Physical Exam:  ?Constitutional: alert, cooperative and no distress  ?Respiratory: breathing non-labored at rest  ?Cardiovascular: regular rate and sinus rhythm  ?Gastrointestinal: soft, non-tender, and non-distended ? ?Labs:  ? ?  Latest Ref Rng & Units 06/18/2021  ?  5:45 AM 06/17/2021  ?  9:12 AM 09/05/2015  ? 10:53 AM  ?CBC  ?WBC 4.0 - 10.5 K/uL 7.9   8.9   7.1    ?Hemoglobin 12.0 - 15.0 g/dL 10.8   12.2   12.5    ?Hematocrit 36.0 - 46.0 % 33.2   37.8   36.4    ?Platelets 150 - 400 K/uL 159   185   241    ? ? ?  Latest Ref Rng & Units 06/18/2021  ?  5:45 AM 06/17/2021  ?  9:12 AM 02/15/2021  ? 11:11 AM  ?CMP  ?Glucose 70 - 99 mg/dL 129   159   105    ?BUN 8 - 23 mg/dL '16   19   15    '$ ?Creatinine 0.44 - 1.00 mg/dL 0.89   0.79   0.96    ?Sodium 135 - 145 mmol/L 138   138   141    ?Potassium 3.5 - 5.1 mmol/L 3.6   3.0   3.7    ?Chloride 98 - 111 mmol/L 103   101   101    ?CO2 22 - 32 mmol/L '27   28   25    '$ ?Calcium 8.9 - 10.3 mg/dL 8.3   9.1   9.4    ?Total Protein 6.5 - 8.1 g/dL 6.3   7.4     ?Total Bilirubin 0.3 - 1.2 mg/dL 1.8   1.0     ?Alkaline Phos 38 - 126 U/L 123   105      ?AST 15 - 41 U/L 404   183     ?ALT 0 - 44 U/L 511   77     ? ? ?Imaging studies: I personally evaluated the images of the ERCP. ? ? ?Assessment/Plan:  ?66 y.o. female with choledocholithiasis with acute cholecystitis Day of Surgery s/p ERCP. ? ?Patient with confirmed choledocholithiasis on MRCP.  She had ERCP today for treatment of choledocholithiasis.  She has been tolerating adequately. ? ?I discussed with patient recommendations to proceed with cholecystectomy.  Purpose of cholecystectomy to prevent recurrent episode of choledocholithiasis.  Patient was oriented about the procedure.  Patient oriented about the risk including bleeding, infection, injury to common bile duct, bile leak, injury to adjacent organs such as small bowel,  stomach, liver, among others.  Also discussed about the risk of anesthesia.  The patient reports she understood and agreed to proceed.  Scheduled for robotic assisted laparoscopic cholecystectomy tomorrow.  N.p.o. after midnight. ? ?Gwendolyn Grant, MD ? ? ? ?

## 2021-06-18 NOTE — TOC Initial Note (Signed)
Transition of Care (TOC) - Initial/Assessment Note  ? ? ?Patient Details  ?Name: Angela Villarreal ?MRN: [id number] ?Date of Birth: September 29, 1955 ? ?Transition of Care (TOC) CM/SW Contact:    ?Beverly Sessions, RN ?Phone Number: ?06/18/2021, 2:02 PM ? ?Clinical Narrative:                 ? ? ?  ?  ? ?Transition of Care (TOC) Screening Note ? ? ?Patient Details  ?Name: Angela Villarreal ?Date of Birth: 10/31/1955 ? ? ?Transition of Care (TOC) CM/SW Contact:    ?Beverly Sessions, RN ?Phone Number: ?06/18/2021, 2:02 PM ? ? ? ?Transition of Care Department [facility]) has reviewed patient and no TOC needs have been identified at this time. We will continue to monitor patient advancement through interdisciplinary progression rounds. If new patient transition needs arise, please place a TOC consult. ?  ? ?Patient Goals and CMS Choice ?  ?  ?  ? ?Expected Discharge Plan and Services ?  ?  ?  ?  ?  ?                ?  ?  ?  ?  ?  ?  ?  ?  ?  ?  ? ?Prior Living Arrangements/Services ?  ?  ?  ?       ?  ?  ?  ?  ? ?Activities of Daily Living ?Home Assistive Devices/Equipment: None ?ADL Screening (condition at time of admission) ?Patient's cognitive ability adequate to safely complete daily activities?: Yes ?Is the patient deaf or have difficulty hearing?: No ?Does the patient have difficulty seeing, even when wearing glasses/contacts?: No ?Does the patient have difficulty concentrating, remembering, or making decisions?: No ?Patient able to express need for assistance with ADLs?: Yes ?Does the patient have difficulty dressing or bathing?: No ?Independently performs ADLs?: Yes (appropriate for developmental age) ?Does the patient have difficulty walking or climbing stairs?: No ?Weakness of Legs: None ?Weakness of Arms/Hands: None ? ?Permission Sought/Granted ?  ?  ?   ?   ?   ?   ? ?Emotional Assessment ?  ?  ?  ?  ?  ?  ? ?Admission diagnosis:  Choledocholithiasis with acute cholecystitis [K80.42] ?Cholecystitis, acute [K81.0] ?Pain of upper  abdomen [R10.10] ?Patient Active Problem List  ? Diagnosis Date Noted  ? Cholecystitis, acute 06/17/2021  ? Choledocholithiasis 06/17/2021  ? Anxiety 08/09/2020  ? Sprain of anterior talofibular ligament of left ankle 07/14/2019  ? Prediabetes 07/14/2019  ? Venous insufficiency of both lower extremities 01/05/2019  ? History of adenomatous polyp of colon 12/01/2017  ? Varicose veins of bilateral lower extremities with pain 09/04/2016  ? History of malignant melanoma 09/04/2016  ? IBS (irritable bowel syndrome) 09/04/2015  ? Hypokalemia 03/05/2015  ? Post-menopausal 11/22/2014  ? Allergic rhinitis 08/17/2014  ? Airway hyperreactivity 08/17/2014  ? Family history of cardiomyopathy 08/17/2014  ? Essential hypertension 08/17/2014  ? Adult hypothyroidism 08/17/2014  ? Obesity 08/17/2014  ? Arthritis, degenerative 08/17/2014  ? Cardiac murmur 07/18/2014  ? Hyperlipemia, mixed 07/17/2014  ? ?PCP:  Virginia Crews, MD ?Pharmacy:   ?[facility] - [place], [place] - [address] ?[address] ?[place] [address] ?Phone: [phone] Fax: [phone] ? ? ? ? ?Social Determinants of Health (SDOH) Interventions ?  ? ?Readmission Risk Interventions ?   ? View : No data to display.  ?  ?  ?  ? ? ? ?

## 2021-06-18 NOTE — Progress Notes (Signed)
?   06/17/21 1847  ?Assess: MEWS Score  ?Temp 100 ?F (37.8 ?C)  ?BP (!) 167/78  ?Pulse Rate (!) 112  ?Resp 16  ?SpO2 97 %  ?O2 Device Room Air  ?Assess: MEWS Score  ?MEWS Temp 0  ?MEWS Systolic 0  ?MEWS Pulse 2  ?MEWS RR 0  ?MEWS LOC 0  ?MEWS Score 2  ?MEWS Score Color Yellow  ?Assess: if the MEWS score is Yellow or Red  ?Were vital signs taken at a resting state? Yes  ?Focused Assessment No change from prior assessment  ?Does the patient meet 2 or more of the SIRS criteria? No  ?MEWS guidelines implemented *See Row Information* Yes  ?Treat  ?Pain Scale 0-10  ?Pain Score 4  ?Pain Type Acute pain  ?Pain Location Head  ?Take Vital Signs  ?Increase Vital Sign Frequency  Yellow: Q 2hr X 2 then Q 4hr X 2, if remains yellow, continue Q 4hrs  ?Escalate  ?MEWS: Escalate Yellow: discuss with charge nurse/RN and consider discussing with provider and RRT  ?Notify: Charge Nurse/RN  ?Name of Charge Nurse/RN Notified Minette Brine  ?Date Charge Nurse/RN Notified 06/17/21  ?Time Charge Nurse/RN Notified 1847  ?Notify: Provider  ?Provider Name/Title Ayiku  ?Date Provider Notified 06/17/21  ?Time Provider Notified [phone]  ?Notification Type Page  ?Notification Reason Change in status  ?Provider response No new orders  ?Date of Provider Response 06/17/21  ?Time of Provider Response [phone]  ?Notify: Rapid Response  ?Name of Rapid Response RN Notified megan  ?Date Rapid Response Notified 06/17/21  ?Time Rapid Response Notified [phone]  ?Document  ?Patient Outcome Other (Comment)  ?Progress note created (see row info) Yes  ?Assess: SIRS CRITERIA  ?SIRS Temperature  0  ?SIRS Pulse 1  ?SIRS Respirations  0  ?SIRS WBC 0  ?SIRS Score Sum  1  ? ? ?

## 2021-06-18 NOTE — Progress Notes (Addendum)
? ? ? ?Progress Note  ? ? ?Angela Villarreal  AGT:[id number] DOB: March 10, 1956  DOA: 06/17/2021 ?PCP: Virginia Crews, MD  ? ? ? ? ?Brief Narrative:  ? ? ?Medical records reviewed and are as summarized below: ? ?Angela Villarreal is a 66 y.o. female with medical history significant for melanoma, hypothyroidism, hypertension, asthma, who presented to the hospital with severe abdominal pain.  Abdominal pain started earlier this morning around 4 AM when it woke her up from sleep.  It was mainly located in the upper abdomen.  It started in the epigastric area and then spread to the left and right upper abdomen.  Eventually, pain "wrapped around to her back ".  It was associated with nausea and there were no known relieving or aggravating factors.  She got some relief when she was given IV Toradol in the emergency room ? ?She was found to have gallstone cholecystitis and choledocholithiasis.  She was treated with analgesics, empiric IV antibiotics and IV fluids.  She also had hypokalemia that was repleted. ? ? ? ?Assessment/Plan:  ? ?Principal Problem: ?  Cholecystitis, acute ?Active Problems: ?  Hypokalemia ?  Choledocholithiasis ? ? ?Nutrition Problem: Increased nutrient needs ?Etiology: acute illness ? ?Signs/Symptoms: estimated needs ? ? ?Body mass index is 33.27 kg/m?. (Obesity) ? ? ?Choledocholithiasis, acute gallstone cholecystitis: S/p ERCP with removal of choledocholithiasis, biliary sphincterotomy and balloon ?extraction.  Plan for laparoscopic cholecystectomy tomorrow.  Continue IV antibiotics.  Use analgesics as needed for pain.  She is on clear liquid diet.  Discontinue IV fluids. ? ?Elevated liver enzymes: This is likely from choledocholithiasis ?  ?Hypokalemia: Improved. ?  ?Hypertension: Continue antihypertensives ?  ?History of asthma: Compensated.  Continue bronchodilators. ? ?Diet Order   ? ?       ?  Diet clear liquid Room service appropriate? Yes; Fluid consistency: Thin  Diet effective now       ?  ? ?   ?  ? ?  ? ? ? ? ? ?Consultants: ?Gastroenterologist ?Education officer, environmental ? ?Procedures: ?ERCP ? ? ? ?Medications:  ? ? diltiazem  120 mg Oral Daily  ? fluticasone  2 spray Each Nare Daily  ? hyoscyamine  0.375 mg Oral BID  ? indomethacin      ? mometasone-formoterol  2 puff Inhalation BID  ? montelukast  10 mg Oral QHS  ? pantoprazole  40 mg Oral Daily  ? sertraline  50 mg Oral Daily  ? triamterene-hydrochlorothiazide  1 tablet Oral Daily  ? ?Continuous Infusions: ? cefTRIAXone (ROCEPHIN)  IV 2 g (06/18/21 1357)  ? metronidazole    ? ? ? ?Anti-infectives (From admission, onward)  ? ? Start     Dose/Rate Route Frequency Ordered Stop  ? 06/18/21 2000  metroNIDAZOLE (FLAGYL) IVPB 500 mg       ? 500 mg ?100 mL/hr over 60 Minutes Intravenous Every 12 hours 06/17/21 1705    ? 06/18/21 1000  cefTRIAXone (ROCEPHIN) 2 g in sodium chloride 0.9 % 100 mL IVPB       ? 2 g ?200 mL/hr over 30 Minutes Intravenous Every 24 hours 06/17/21 1705    ? 06/17/21 1530  cefTRIAXone (ROCEPHIN) 2 g in sodium chloride 0.9 % 100 mL IVPB  Status:  Discontinued       ? 2 g ?200 mL/hr over 30 Minutes Intravenous  Once 06/17/21 1519 06/17/21 1817  ? 06/17/21 1530  metroNIDAZOLE (FLAGYL) IVPB 500 mg  Status:  Discontinued       ?  500 mg ?100 mL/hr over 60 Minutes Intravenous  Once 06/17/21 1519 06/17/21 1705  ? ?  ? ? ? ? ? ? ? ? ? ?Family Communication/Anticipated D/C date and plan/Code Status  ? ?DVT prophylaxis: SCDs Start: 06/17/21 1705 ? ? ?  Code Status: Full Code ? ?Family Communication: None ?Disposition Plan: Possible discharge to home in 2 days ? ? ?Status is: Inpatient ?Remains inpatient appropriate because: IV antibiotics, plan for cholecystectomy ? ? ? ? ? ? ?Subjective:  ? ?Interval events noted.  She feels better today.  Abdominal pain is better.  [name], RN, was at the bedside ? ?Objective:  ? ? ?Vitals:  ? 06/18/21 1035 06/18/21 1145 06/18/21 1155 06/18/21 1205  ?BP: (!) 151/76 109/65 108/68 136/79  ?Pulse: 74 78 78 80  ?Resp: '20 16 14  '$ (!) 22  ?Temp: (!) 97.3 ?F (36.3 ?C) (!) 97.2 ?F (36.2 ?C)    ?TempSrc: Temporal Temporal    ?SpO2: 98% 98% 92% 98%  ?Weight: 90.7 kg     ?Height: '5\' 5"'$  (1.651 m)     ? ?No data found. ? ? ?Intake/Output Summary (Last 24 hours) at 06/18/2021 1500 ?Last data filed at 06/18/2021 0349 ?Gross per 24 hour  ?Intake 700.81 ml  ?Output --  ?Net 700.81 ml  ? ?Filed Weights  ? 06/17/21 0910 06/18/21 1035  ?Weight: 90.7 kg 90.7 kg  ? ? ?Exam: ? ?GEN: NAD ?SKIN: No rash ?EYES: EOMI ?ENT: MMM ?CV: RRR ?PULM: CTA B ?ABD: soft, ND, mild epigastric tenderness, +BS ?CNS: AAO x 3, non focal ?EXT: No edema or tenderness ? ? ? ?  ? ? ?Data Reviewed:  ? ?I have personally reviewed following labs and imaging studies: ? ?Labs: ?Labs show the following:  ? ?Basic Metabolic Panel: ?Recent Labs  ?Lab 06/17/21 ?0912 06/18/21 ?7124  ?NA 138 138  ?K 3.0* 3.6  ?CL 101 103  ?CO2 28 27  ?GLUCOSE 159* 129*  ?BUN 19 16  ?CREATININE 0.79 0.89  ?CALCIUM 9.1 8.3*  ?MG 1.8  --   ?PHOS 3.1  --   ? ?GFR ?Estimated Creatinine Clearance: 70.1 mL/min (by C-G formula based on SCr of 0.89 mg/dL). ?Liver Function Tests: ?Recent Labs  ?Lab 06/17/21 ?0912 06/18/21 ?5809  ?AST 183* 404*  ?ALT 77* 511*  ?ALKPHOS 105 123  ?BILITOT 1.0 1.8*  ?PROT 7.4 6.3*  ?ALBUMIN 4.1 3.4*  ? ?Recent Labs  ?Lab 06/17/21 ?0912  ?LIPASE 36  ? ?No results for input(s): AMMONIA in the last 168 hours. ?Coagulation profile ?No results for input(s): INR, PROTIME in the last 168 hours. ? ?CBC: ?Recent Labs  ?Lab 06/17/21 ?0912 06/18/21 ?9833  ?WBC 8.9 7.9  ?HGB 12.2 10.8*  ?HCT 37.8 33.2*  ?MCV 86.3 86.0  ?PLT 185 159  ? ?Cardiac Enzymes: ?No results for input(s): CKTOTAL, CKMB, CKMBINDEX, TROPONINI in the last 168 hours. ?BNP (last 3 results) ?No results for input(s): PROBNP in the last 8760 hours. ?CBG: ?No results for input(s): GLUCAP in the last 168 hours. ?D-Dimer: ?No results for input(s): DDIMER in the last 72 hours. ?Hgb A1c: ?No results for input(s): HGBA1C in the last 72  hours. ?Lipid Profile: ?No results for input(s): CHOL, HDL, LDLCALC, TRIG, CHOLHDL, LDLDIRECT in the last 72 hours. ?Thyroid function studies: ?No results for input(s): TSH, T4TOTAL, T3FREE, THYROIDAB in the last 72 hours. ? ?Invalid input(s): FREET3 ?Anemia work up: ?No results for input(s): VITAMINB12, FOLATE, FERRITIN, TIBC, IRON, RETICCTPCT in the last 72 hours. ?Sepsis Labs: ?Recent Labs  ?  Lab 06/17/21 ?0912 06/18/21 ?3159  ?WBC 8.9 7.9  ? ? ?Microbiology ?No results found for this or any previous visit (from the past 240 hour(s)). ? ?Procedures and diagnostic studies: ? ?MR ABDOMEN MRCP WO CONTRAST ? ?Result Date: 06/17/2021 ?CLINICAL DATA:  Right upper quadrant pain EXAM: MRI ABDOMEN WITHOUT CONTRAST  (INCLUDING MRCP) TECHNIQUE: Multiplanar multisequence MR imaging of the abdomen was performed. Heavily T2-weighted images of the biliary and pancreatic ducts were obtained, and three-dimensional MRCP images were rendered by post processing. COMPARISON:  Abdominal ultrasound dated June 17, 2021 FINDINGS: Lower chest: No acute findings. Hepatobiliary: Dilated gallbladder with trace pericholecystic fluid. Dilated common bile duct measuring up to 10 mm. Filling defect of the distal common bile duct measuring 6 mm seen on coronal thins, series 15, image 16. Pancreas: No mass, inflammatory changes, or other parenchymal abnormality identified. Spleen:  Within normal limits in size and appearance. Adrenals/Urinary Tract: No masses identified. No evidence of hydronephrosis. Stomach/Bowel: Small hiatal hernia. Stomach otherwise unremarkable. Visualized small and large bowel are unremarkable. Vascular/Lymphatic: No pathologically enlarged lymph nodes identified. No abdominal aortic aneurysm demonstrated. Other:  None. Musculoskeletal: No suspicious bone lesions identified. IMPRESSION: 1. Dilated gallbladder with trace pericholecystic fluid, concerning for acute cholecystitis. 2. Dilated common bile duct with filling defect  of the distal common bile duct measuring 6 mm, compatible with choledocholithiasis. Electronically Signed   By: Yetta Glassman M.D.   On: 06/17/2021 15:07  ? ?MR 3D Recon At Scanner ? ?Result Date: 06/17/2021 ?C

## 2021-06-18 NOTE — H&P (View-Only) (Signed)
Patient ID: Angela Villarreal, female   DOB: 18-Aug-1955, 66 y.o.   MRN: [id number] ?    SURGICAL PROGRESS NOTE  ? ?Hospital Day(s): 1.  ? ?Interval History: Patient seen and examined, no acute events or new complaints overnight. Patient reports feeling well.  Patient had ERCP today.  She endorses that she tolerated the procedure well.  She has mild soreness on the right upper quadrant.  No pain radiation.  She cannot identify any alleviating or aggravating factors. ? ?Vital signs in last 24 hours: [min-max] current  ?Temp:  [97.2 ?F (36.2 ?C)-100.1 ?F (37.8 ?C)] 97.8 ?F (36.6 ?C) (04/04 1539) ?Pulse Rate:  [72-112] 72 (04/04 1539) ?Resp:  [14-22] 22 (04/04 1205) ?BP: (108-167)/(62-82) 163/77 (04/04 1539) ?SpO2:  [92 %-100 %] 95 % (04/04 1539) ?Weight:  [90.7 kg] 90.7 kg (04/04 1035)     Height: '5\' 5"'$  (165.1 cm) Weight: 90.7 kg BMI (Calculated): 33.27  ? ?Physical Exam:  ?Constitutional: alert, cooperative and no distress  ?Respiratory: breathing non-labored at rest  ?Cardiovascular: regular rate and sinus rhythm  ?Gastrointestinal: soft, non-tender, and non-distended ? ?Labs:  ? ?  Latest Ref Rng & Units 06/18/2021  ?  5:45 AM 06/17/2021  ?  9:12 AM 09/05/2015  ? 10:53 AM  ?CBC  ?WBC 4.0 - 10.5 K/uL 7.9   8.9   7.1    ?Hemoglobin 12.0 - 15.0 g/dL 10.8   12.2   12.5    ?Hematocrit 36.0 - 46.0 % 33.2   37.8   36.4    ?Platelets 150 - 400 K/uL 159   185   241    ? ? ?  Latest Ref Rng & Units 06/18/2021  ?  5:45 AM 06/17/2021  ?  9:12 AM 02/15/2021  ? 11:11 AM  ?CMP  ?Glucose 70 - 99 mg/dL 129   159   105    ?BUN 8 - 23 mg/dL '16   19   15    '$ ?Creatinine 0.44 - 1.00 mg/dL 0.89   0.79   0.96    ?Sodium 135 - 145 mmol/L 138   138   141    ?Potassium 3.5 - 5.1 mmol/L 3.6   3.0   3.7    ?Chloride 98 - 111 mmol/L 103   101   101    ?CO2 22 - 32 mmol/L '27   28   25    '$ ?Calcium 8.9 - 10.3 mg/dL 8.3   9.1   9.4    ?Total Protein 6.5 - 8.1 g/dL 6.3   7.4     ?Total Bilirubin 0.3 - 1.2 mg/dL 1.8   1.0     ?Alkaline Phos 38 - 126 U/L 123   105      ?AST 15 - 41 U/L 404   183     ?ALT 0 - 44 U/L 511   77     ? ? ?Imaging studies: I personally evaluated the images of the ERCP. ? ? ?Assessment/Plan:  ?66 y.o. female with choledocholithiasis with acute cholecystitis Day of Surgery s/p ERCP. ? ?Patient with confirmed choledocholithiasis on MRCP.  She had ERCP today for treatment of choledocholithiasis.  She has been tolerating adequately. ? ?I discussed with patient recommendations to proceed with cholecystectomy.  Purpose of cholecystectomy to prevent recurrent episode of choledocholithiasis.  Patient was oriented about the procedure.  Patient oriented about the risk including bleeding, infection, injury to common bile duct, bile leak, injury to adjacent organs such as small bowel,  stomach, liver, among others.  Also discussed about the risk of anesthesia.  The patient reports she understood and agreed to proceed.  Scheduled for robotic assisted laparoscopic cholecystectomy tomorrow.  N.p.o. after midnight. ? ?Gwendolyn Grant, MD ? ? ? ?

## 2021-06-18 NOTE — Op Note (Signed)
[facility] ?Gastroenterology ?Patient Name: Angela Villarreal ?Procedure Date: 06/18/2021 11:14 AM ?MRN: [id number] ?Account #: [id number] ?Date of Birth: April 27, 1955 ?Admit Type: Outpatient ?Age: 66 ?Room: [facility] ENDO ROOM 4 ?Gender: Female ?Note Status: Finalized ?Instrument Name: EXALT Ercp scope ?Procedure:             ERCP ?Indications:           Common bile duct stone(s) ?Providers:             Lucilla Lame MD, MD ?Referring MD:          Dionne Bucy. Bacigalupo (Referring MD) ?Medicines:             Propofol per Anesthesia ?Complications:         No immediate complications. ?Procedure:             Pre-Anesthesia Assessment: ?                       - Prior to the procedure, a History and Physical was  ?                       performed, and patient medications and allergies were  ?                       reviewed. The patient's tolerance of previous  ?                       anesthesia was also reviewed. The risks and benefits  ?                       of the procedure and the sedation options and risks  ?                       were discussed with the patient. All questions were  ?                       answered, and informed consent was obtained. Prior  ?                       Anticoagulants: The patient has taken no previous  ?                       anticoagulant or antiplatelet agents. ASA Grade  ?                       Assessment: II - A patient with mild systemic disease.  ?                       After reviewing the risks and benefits, the patient  ?                       was deemed in satisfactory condition to undergo the  ?                       procedure. ?                       After obtaining informed consent, the scope was passed  ?  under direct vision. Throughout the procedure, the  ?                       patient's blood pressure, pulse, and oxygen  ?                       saturations were monitored continuously. The [place]  ?                       [employer] D  single use duodenoscope was  ?                       introduced through the mouth, and used to inject  ?                       contrast into and used to inject contrast into the  ?                       bile duct. The ERCP was accomplished without  ?                       difficulty. The patient tolerated the procedure well. ?Findings: ?     The scout film was normal. The esophagus was successfully intubated  ?     under direct vision. The scope was advanced to a normal major papilla in  ?     the descending duodenum without detailed examination of the pharynx,  ?     larynx and associated structures, and upper GI tract. The upper GI tract  ?     was grossly normal. The bile duct was deeply cannulated with the  ?     short-nosed traction sphincterotome. Contrast was injected. I personally  ?     interpreted the bile duct images. There was brisk flow of contrast  ?     through the ducts. Image quality was excellent. Contrast extended to the  ?     entire biliary tree. The major papilla was adjacent to a diverticulum. A  ?     wire was passed into the biliary tree. A 9 mm biliary sphincterotomy was  ?     made with a traction (standard) sphincterotome using ERBE  ?     electrocautery. There was no post-sphincterotomy bleeding. The biliary  ?     tree was swept with a 15 mm balloon starting at the bifurcation. All  ?     stones were removed. ?Impression:            - The major papilla was adjacent to a diverticulum. ?                       - Choledocholithiasis was found. Complete removal was  ?                       accomplished by biliary sphincterotomy and balloon  ?                       extraction. ?                       - A biliary sphincterotomy was performed. ?                       -  The biliary tree was swept. ?Recommendation:        - Return patient to hospital ward for ongoing care. ?                       - Clear liquid diet. ?                       - Watch for pancreatitis, bleeding, perforation, and  ?                        cholangitis. ?Procedure Code(s):     --- Professional --- ?                       [phone], Endoscopic retrograde cholangiopancreatography  ?                       (ERCP); with removal of calculi/debris from  ?                       biliary/pancreatic duct(s) ?                       90300, Endoscopic retrograde cholangiopancreatography  ?                       (ERCP); with sphincterotomy/papillotomy ?                       74328, Endoscopic catheterization of the biliary  ?                       ductal system, radiological supervision and  ?                       interpretation ?Diagnosis Code(s):     --- Professional --- ?                       K80.50, Calculus of bile duct without cholangitis or  ?                       cholecystitis without obstruction ?CPT copyright 2019 American Medical Association. All rights reserved. ?The codes documented in this report are preliminary and upon coder review may  ?be revised to meet current compliance requirements. ?Lucilla Lame MD, MD ?06/18/2021 11:41:36 AM ?This report has been signed electronically. ?Number of Addenda: 0 ?Note Initiated On: 06/18/2021 11:14 AM ?Estimated Blood Loss:  Estimated blood loss: none. ?     [facility] ?

## 2021-06-18 NOTE — Anesthesia Postprocedure Evaluation (Signed)
Anesthesia Post Note ? ?Patient: Angela Villarreal ? ?Procedure(s) Performed: ENDOSCOPIC RETROGRADE CHOLANGIOPANCREATOGRAPHY (ERCP) ? ?Patient location during evaluation: Endoscopy ?Anesthesia Type: General ?Level of consciousness: awake and alert ?Pain management: pain level controlled ?Vital Signs Assessment: post-procedure vital signs reviewed and stable ?Respiratory status: spontaneous breathing, nonlabored ventilation and respiratory function stable ?Cardiovascular status: blood pressure returned to baseline and stable ?Postop Assessment: no apparent nausea or vomiting ?Anesthetic complications: no ? ? ?No notable events documented. ? ? ?Last Vitals:  ?Vitals:  ? 06/18/21 1035 06/18/21 1145  ?BP: (!) 151/76 109/65  ?Pulse: 74 78  ?Resp: 20 16  ?Temp: (!) 36.3 ?C (!) 36.2 ?C  ?SpO2: 98% 98%  ?  ?Last Pain:  ?Vitals:  ? 06/18/21 1145  ?TempSrc: Temporal  ?PainSc: Asleep  ? ? ?  ?  ?  ?  ?  ?  ? ?[place] Ouch ? ? ? ? ?

## 2021-06-18 NOTE — Progress Notes (Signed)
Initial Nutrition Assessment ? ?DOCUMENTATION CODES:  ? ?Obesity unspecified ? ?INTERVENTION:  ?- Once diet resumes, recommend regular diet ? ?NUTRITION DIAGNOSIS:  ? ?Increased nutrient needs related to acute illness as evidenced by estimated needs. ? ?GOAL:  ? ?Patient will meet greater than or equal to 90% of their needs ? ?MONITOR:  ? ?PO intake, Supplement acceptance, Labs, Weight trends, Diet advancement ? ?REASON FOR ASSESSMENT:  ? ?Malnutrition Screening Tool ?  ? ?ASSESSMENT:  ? ?Pt admitted with abdominal pain secondary to acute cholecystitis. PMH significant for melanoma, hypothyroidism, HTN and asthma. ? ?Plans for ERCP today with consideration for cholecystectomy ? ?Pt out of room for procedure. Will follow up with pt as appropriate.  ? ?Reviewed weight history. Weight appears stable. Current admission weight 90.7 kg.  ? ?Medications: hyoscyamine, protonix, maxzide, IV abx, D5 and NaCl with KCl @ 59m/hr ? ?Labs: calcium 7.8, AST 404, ALT 511 ? ?NUTRITION - FOCUSED PHYSICAL EXAM: ?Deferred to follow up ? ?Diet Order:   ?Diet Order   ? ?       ?  Diet NPO time specified  Diet effective midnight       ?  ? ?  ?  ? ?  ? ? ?EDUCATION NEEDS:  ? ?No education needs have been identified at this time ? ?Skin:  Skin Assessment: Reviewed RN Assessment ? ?Last BM:  4/3 ? ?Height:  ? ?Ht Readings from Last 1 Encounters:  ?06/18/21 '5\' 5"'$  (1.651 m)  ? ? ?Weight:  ? ?Wt Readings from Last 1 Encounters:  ?06/18/21 90.7 kg  ? ?BMI:  Body mass index is 33.27 kg/m?. ? ?Estimated Nutritional Needs:  ? ?Kcal:  1600-1800 ? ?Protein:  80-95g ? ?Fluid:  >/=1.6L ? ?AClayborne Dana RDN, LDN ?Clinical Nutrition ?

## 2021-06-18 NOTE — Anesthesia Preprocedure Evaluation (Addendum)
Anesthesia Evaluation  ?Patient identified by MRN, date of birth, ID band ?Patient awake ? ? ? ?Reviewed: ?Allergy & Precautions, NPO status , Patient's Chart, lab work & pertinent test results ? ?History of Anesthesia Complications ?Negative for: history of anesthetic complications ? ?Airway ?Mallampati: II ? ?TM Distance: <3 FB ?Neck ROM: Full ? ? ? Dental ?no notable dental hx. ?(+) Teeth Intact ?  ?Pulmonary ?asthma , neg sleep apnea, neg COPD, Patient abstained from smoking.Not current smoker,  ?  ?Pulmonary exam normal ?breath sounds clear to auscultation ? ? ? ? ? ? Cardiovascular ?Exercise Tolerance: Good ?METShypertension, (-) CAD and (-) Past MI (-) dysrhythmias  ?Rhythm:Regular Rate:Normal ?- Systolic murmurs ? ?  ?Neuro/Psych ?PSYCHIATRIC DISORDERS Anxiety negative neurological ROS ?   ? GI/Hepatic ?GERD  Controlled,(+)  ?  ? (-) substance abuse ? , Choledolcholithiasis ?  ?Endo/Other  ?neg diabetesHypothyroidism  ? Renal/GU ?negative Renal ROS  ? ?  ?Musculoskeletal ? ?(+) Arthritis ,  ? Abdominal ?Normal abdominal exam  (+)   ?Peds ? Hematology ?  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    Comment:  Right clavicle. AMP. Excised: 04/19/2009, margins free. 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    Comment:  L cheek ?No date: Hypertension ?No date: Hypothyroidism ? Reproductive/Obstetrics ? ?  ? ? ? ? ? ? ? ? ? ? ? ? ? ?  ?  ? ? ? ? ? ? ? ?Anesthesia Physical ? ?Anesthesia Plan ? ?ASA: 2 ? ?Anesthesia Plan: General  ? ?Post-op Pain Management: Minimal or no pain anticipated  ? ?Induction: Intravenous ? ?PONV Risk Score and Plan: 3 and Propofol infusion, TIVA and Ondansetron ? ?Airway Management Planned: Natural Airway and Simple Face Mask ? ?Additional Equipment: None ? ?Intra-op Plan:  ? ?Post-operative Plan:  ? ?Informed Consent: I have reviewed the patients History and Physical, chart,  labs and discussed the procedure including the risks, benefits and alternatives for the proposed anesthesia with the patient or authorized representative who has indicated his/her understanding and acceptance.  ? ? ? ?Dental advisory given ? ?Plan Discussed with: CRNA and Anesthesiologist ? ?Anesthesia Plan Comments: (Discussed risks of anesthesia with patient, including possibility of difficulty with spontaneous ventilation under anesthesia necessitating airway intervention, PONV, and rare risks such as cardiac or respiratory or neurological events, and allergic reactions. Discussed the role of CRNA in patient's perioperative care. Patient understands.)  ? ? ? ? ? ?Anesthesia Quick Evaluation ? ?

## 2021-06-19 ENCOUNTER — Inpatient Hospital Stay: Payer: Medicare PPO | Admitting: Anesthesiology

## 2021-06-19 ENCOUNTER — Encounter: Payer: Self-pay | Admitting: Internal Medicine

## 2021-06-19 ENCOUNTER — Encounter
Admission: EM | Disposition: A | Discharge status: Home / Self Care | Payer: Self-pay | Source: Home / Self Care | Attending: Internal Medicine

## 2021-06-19 ENCOUNTER — Other Ambulatory Visit: Payer: Self-pay

## 2021-06-19 DIAGNOSIS — K810 Acute cholecystitis: Secondary | ICD-10-CM

## 2021-06-19 LAB — CBC WITH DIFFERENTIAL/PLATELET
Abs Immature Granulocytes: 0.02 10*3/uL (ref 0.00–0.07)
Basophils Absolute: 0 10*3/uL (ref 0.0–0.1)
Basophils Relative: 1 %
Eosinophils Absolute: 0.4 10*3/uL (ref 0.0–0.5)
Eosinophils Relative: 8 %
HCT: 33.5 % — ABNORMAL LOW (ref 36.0–46.0)
Hemoglobin: 10.8 g/dL — ABNORMAL LOW (ref 12.0–15.0)
Immature Granulocytes: 0 %
Lymphocytes Relative: 11 %
Lymphs Abs: 0.6 10*3/uL — ABNORMAL LOW (ref 0.7–4.0)
MCH: 28.1 pg (ref 26.0–34.0)
MCHC: 32.2 g/dL (ref 30.0–36.0)
MCV: 87 fL (ref 80.0–100.0)
Monocytes Absolute: 0.5 10*3/uL (ref 0.1–1.0)
Monocytes Relative: 8 %
Neutro Abs: 4.1 10*3/uL (ref 1.7–7.7)
Neutrophils Relative %: 72 %
Platelets: 158 10*3/uL (ref 150–400)
RBC: 3.85 MIL/uL — ABNORMAL LOW (ref 3.87–5.11)
RDW: 14 % (ref 11.5–15.5)
WBC: 5.6 10*3/uL (ref 4.0–10.5)
nRBC: 0 % (ref 0.0–0.2)

## 2021-06-19 LAB — COMPREHENSIVE METABOLIC PANEL
ALT: 307 U/L — ABNORMAL HIGH (ref 0–44)
AST: 120 U/L — ABNORMAL HIGH (ref 15–41)
Albumin: 3.5 g/dL (ref 3.5–5.0)
Alkaline Phosphatase: 113 U/L (ref 38–126)
Anion gap: 8 (ref 5–15)
BUN: 9 mg/dL (ref 8–23)
CO2: 27 mmol/L (ref 22–32)
Calcium: 8.4 mg/dL — ABNORMAL LOW (ref 8.9–10.3)
Chloride: 102 mmol/L (ref 98–111)
Creatinine, Ser: 0.8 mg/dL (ref 0.44–1.00)
GFR, Estimated: >60 mL/min (ref >60)
Glucose, Bld: 114 mg/dL — ABNORMAL HIGH (ref 70–99)
Potassium: 3.2 mmol/L — ABNORMAL LOW (ref 3.5–5.1)
Sodium: 137 mmol/L (ref 135–145)
Total Bilirubin: 1 mg/dL (ref 0.3–1.2)
Total Protein: 6.5 g/dL (ref 6.5–8.1)

## 2021-06-19 SURGERY — CHOLECYSTECTOMY, ROBOT-ASSISTED, LAPAROSCOPIC
Anesthesia: General | Site: Abdomen

## 2021-06-19 MED ORDER — DROPERIDOL 2.5 MG/ML IJ SOLN
0.6250 mg | Freq: Once | INTRAMUSCULAR | Status: DC | PRN
  Filled 2021-06-19: qty 2

## 2021-06-19 MED ORDER — BUPIVACAINE-EPINEPHRINE (PF) 0.25% -1:200000 IJ SOLN
INTRAMUSCULAR | Status: AC
  Filled 2021-06-19: qty 30

## 2021-06-19 MED ORDER — GABAPENTIN 300 MG PO CAPS: 300.0000 mg | ORAL_CAPSULE | Freq: Once | ORAL | Status: AC

## 2021-06-19 MED ORDER — PHENYLEPHRINE HCL-NACL 20-0.9 MG/250ML-% IV SOLN
INTRAVENOUS | Status: DC | PRN
  Administered 2021-06-19: 50 ug/min via INTRAVENOUS

## 2021-06-19 MED ORDER — EPHEDRINE SULFATE (PRESSORS) 50 MG/ML IJ SOLN
INTRAMUSCULAR | Status: DC | PRN
Start: 2021-06-19 — End: 2021-06-19
  Administered 2021-06-19: 5 mg via INTRAVENOUS
  Administered 2021-06-19 (×2): 10 mg via INTRAVENOUS

## 2021-06-19 MED ORDER — MIDAZOLAM HCL 2 MG/2ML IJ SOLN
INTRAMUSCULAR | Status: AC
  Filled 2021-06-19: qty 2

## 2021-06-19 MED ORDER — HYDROCODONE-ACETAMINOPHEN 7.5-325 MG PO TABS: 1.0000 | ORAL_TABLET | Freq: Four times a day (QID) | ORAL | Status: DC | PRN

## 2021-06-19 MED ORDER — PROPOFOL 10 MG/ML IV BOLUS
INTRAVENOUS | Status: AC
  Filled 2021-06-19: qty 20

## 2021-06-19 MED ORDER — POTASSIUM CHLORIDE CRYS ER 20 MEQ PO TBCR
40.0000 meq | EXTENDED_RELEASE_TABLET | Freq: Once | ORAL | Status: AC
  Administered 2021-06-19: 40 meq via ORAL
  Filled 2021-06-19: qty 2

## 2021-06-19 MED ORDER — HYDROMORPHONE HCL 1 MG/ML IJ SOLN
INTRAMUSCULAR | Status: AC
  Administered 2021-06-19: 0.25 mg via INTRAVENOUS
  Filled 2021-06-19: qty 1

## 2021-06-19 MED ORDER — PROPOFOL 10 MG/ML IV BOLUS
INTRAVENOUS | Status: DC | PRN
  Administered 2021-06-19: 150 mg via INTRAVENOUS

## 2021-06-19 MED ORDER — LIDOCAINE HCL (CARDIAC) PF 100 MG/5ML IV SOSY
PREFILLED_SYRINGE | INTRAVENOUS | Status: DC | PRN
  Administered 2021-06-19: 100 mg via INTRAVENOUS

## 2021-06-19 MED ORDER — POTASSIUM CHLORIDE 10 MEQ/100ML IV SOLN: 10.0000 meq | INTRAVENOUS | Status: DC

## 2021-06-19 MED ORDER — CEFAZOLIN SODIUM-DEXTROSE 2-3 GM-%(50ML) IV SOLR
INTRAVENOUS | Status: DC | PRN
  Administered 2021-06-19: 2 g via INTRAVENOUS

## 2021-06-19 MED ORDER — SODIUM CHLORIDE 0.9 % IV SOLN: INTRAVENOUS | Status: DC

## 2021-06-19 MED ORDER — GABAPENTIN 300 MG PO CAPS
ORAL_CAPSULE | ORAL | Status: AC
  Administered 2021-06-19: 300 mg via ORAL
  Filled 2021-06-19: qty 1

## 2021-06-19 MED ORDER — MIDAZOLAM HCL 2 MG/2ML IJ SOLN
INTRAMUSCULAR | Status: DC | PRN
  Administered 2021-06-19: 2 mg via INTRAVENOUS

## 2021-06-19 MED ORDER — HYDROMORPHONE HCL 1 MG/ML IJ SOLN
0.2500 mg | INTRAMUSCULAR | Status: DC | PRN
  Administered 2021-06-19 (×2): 0.25 mg via INTRAVENOUS

## 2021-06-19 MED ORDER — FENTANYL CITRATE (PF) 100 MCG/2ML IJ SOLN
INTRAMUSCULAR | Status: DC | PRN
Start: 2021-06-19 — End: 2021-06-19
  Administered 2021-06-19 (×2): 50 ug via INTRAVENOUS

## 2021-06-19 MED ORDER — LACTATED RINGERS IV SOLN: INTRAVENOUS | Status: DC | PRN

## 2021-06-19 MED ORDER — ONDANSETRON HCL 4 MG/2ML IJ SOLN
INTRAMUSCULAR | Status: DC | PRN
  Administered 2021-06-19: 4 mg via INTRAVENOUS

## 2021-06-19 MED ORDER — ROCURONIUM BROMIDE 100 MG/10ML IV SOLN
INTRAVENOUS | Status: DC | PRN
  Administered 2021-06-19: 50 mg via INTRAVENOUS

## 2021-06-19 MED ORDER — FENTANYL CITRATE (PF) 100 MCG/2ML IJ SOLN
INTRAMUSCULAR | Status: AC
Start: 2021-06-19 — End: ?
  Filled 2021-06-19: qty 2

## 2021-06-19 MED ORDER — CELECOXIB 200 MG PO CAPS
ORAL_CAPSULE | ORAL | Status: AC
  Filled 2021-06-19: qty 1

## 2021-06-19 MED ORDER — DEXAMETHASONE SODIUM PHOSPHATE 10 MG/ML IJ SOLN
INTRAMUSCULAR | Status: DC | PRN
  Administered 2021-06-19: 10 mg via INTRAVENOUS

## 2021-06-19 MED ORDER — PROMETHAZINE HCL 25 MG/ML IJ SOLN: 6.2500 mg | INTRAMUSCULAR | Status: DC | PRN

## 2021-06-19 MED ORDER — 0.9 % SODIUM CHLORIDE (POUR BTL) OPTIME
TOPICAL | Status: DC | PRN
  Administered 2021-06-19: 500 mL

## 2021-06-19 MED ORDER — GLYCOPYRROLATE 0.2 MG/ML IJ SOLN
INTRAMUSCULAR | Status: DC | PRN
  Administered 2021-06-19: .2 mg via INTRAVENOUS

## 2021-06-19 MED ORDER — PHENYLEPHRINE 40 MCG/ML (10ML) SYRINGE FOR IV PUSH (FOR BLOOD PRESSURE SUPPORT)
PREFILLED_SYRINGE | INTRAVENOUS | Status: DC | PRN
  Administered 2021-06-19 (×2): 160 ug via INTRAVENOUS

## 2021-06-19 MED ORDER — CELECOXIB 200 MG PO CAPS
200.0000 mg | ORAL_CAPSULE | Freq: Once | ORAL | Status: AC
  Administered 2021-06-19: 200 mg via ORAL

## 2021-06-19 MED ORDER — KETOROLAC TROMETHAMINE 30 MG/ML IJ SOLN
INTRAMUSCULAR | Status: DC | PRN
  Administered 2021-06-19: 30 mg via INTRAVENOUS

## 2021-06-19 SURGICAL SUPPLY — 56 items
ADH SKN CLS APL DERMABOND .7 (GAUZE/BANDAGES/DRESSINGS) ×2
BAG INFUSER PRESSURE 100CC (MISCELLANEOUS) IMPLANT
BAG RETRIEVAL 10 (BASKET) ×1
BLADE SURG SZ11 CARB STEEL (BLADE) ×3 IMPLANT
CANNULA REDUC XI 12-8 STAPL (CANNULA) ×3
CANNULA REDUCER 12-8 DVNC XI (CANNULA) ×2 IMPLANT
CATH REDDICK CHOLANGI 4FR 50CM (CATHETERS) IMPLANT
CLIP LIGATING HEM O LOK PURPLE (MISCELLANEOUS) IMPLANT
CLIP LIGATING HEMO O LOK GREEN (MISCELLANEOUS) ×3 IMPLANT
DERMABOND ADVANCED (GAUZE/BANDAGES/DRESSINGS) ×1
DERMABOND ADVANCED .7 DNX12 (GAUZE/BANDAGES/DRESSINGS) ×2 IMPLANT
DRAPE ARM DVNC X/XI (DISPOSABLE) ×8 IMPLANT
DRAPE C-ARM XRAY 36X54 (DRAPES) IMPLANT
DRAPE COLUMN DVNC XI (DISPOSABLE) ×2 IMPLANT
DRAPE DA VINCI XI ARM (DISPOSABLE) ×12
DRAPE DA VINCI XI COLUMN (DISPOSABLE) ×3
ELECT REM PT RETURN 9FT ADLT (ELECTROSURGICAL) ×3
ELECTRODE REM PT RTRN 9FT ADLT (ELECTROSURGICAL) ×2 IMPLANT
GLOVE SURG ENC MOIS LTX SZ6.5 (GLOVE) ×6 IMPLANT
GLOVE SURG UNDER POLY LF SZ6.5 (GLOVE) ×6 IMPLANT
GOWN STRL REUS W/ TWL LRG LVL3 (GOWN DISPOSABLE) ×6 IMPLANT
GOWN STRL REUS W/TWL LRG LVL3 (GOWN DISPOSABLE) ×9
GRASPER SUT TROCAR 14GX15 (MISCELLANEOUS) ×3 IMPLANT
IRRIGATOR SUCT 8 DISP DVNC XI (IRRIGATION / IRRIGATOR) IMPLANT
IRRIGATOR SUCTION 8MM XI DISP (IRRIGATION / IRRIGATOR)
IV CATH ANGIO 12GX3 LT BLUE (NEEDLE) IMPLANT
IV NS 1000ML (IV SOLUTION)
IV NS 1000ML BAXH (IV SOLUTION) IMPLANT
KIT PINK PAD W/HEAD ARE REST (MISCELLANEOUS) ×3 IMPLANT
KIT PINK PAD W/HEAD ARM REST (MISCELLANEOUS) ×2 IMPLANT
LABEL OR SOLS (LABEL) ×3 IMPLANT
MANIFOLD NEPTUNE II (INSTRUMENTS) ×3 IMPLANT
NDL INSUFFLATION 14GA 120MM (NEEDLE) ×2 IMPLANT
NEEDLE HYPO 22GX1.5 SAFETY (NEEDLE) ×3 IMPLANT
NEEDLE INSUFFLATION 14GA 120MM (NEEDLE) ×3 IMPLANT
NS IRRIG 500ML POUR BTL (IV SOLUTION) ×3 IMPLANT
OBTURATOR OPTICAL STANDARD 8MM (TROCAR) ×3
OBTURATOR OPTICAL STND 8 DVNC (TROCAR) ×2
OBTURATOR OPTICALSTD 8 DVNC (TROCAR) ×2 IMPLANT
PACK LAP CHOLECYSTECTOMY (MISCELLANEOUS) ×3 IMPLANT
SEAL CANN UNIV 5-8 DVNC XI (MISCELLANEOUS) ×6 IMPLANT
SEAL XI 5MM-8MM UNIVERSAL (MISCELLANEOUS) ×9
SET TUBE SMOKE EVAC HIGH FLOW (TUBING) ×3 IMPLANT
SLEEVE SCD COMPRESS KNEE MED (STOCKING) ×1 IMPLANT
SOLUTION ELECTROLUBE (MISCELLANEOUS) ×3 IMPLANT
SPIKE FLUID TRANSFER (MISCELLANEOUS) ×6 IMPLANT
SPONGE T-LAP 4X18 ~~LOC~~+RFID (SPONGE) IMPLANT
STAPLER CANNULA SEAL DVNC XI (STAPLE) ×2 IMPLANT
STAPLER CANNULA SEAL XI (STAPLE) ×3
SUT MNCRL 4-0 (SUTURE) ×3
SUT MNCRL 4-0 27XMFL (SUTURE) ×2
SUT VICRYL 0 AB UR-6 (SUTURE) ×3 IMPLANT
SUTURE MNCRL 4-0 27XMF (SUTURE) ×2 IMPLANT
SYS BAG RETRIEVAL 10MM (BASKET) ×2
SYSTEM BAG RETRIEVAL 10MM (BASKET) ×2 IMPLANT
WATER STERILE IRR 500ML POUR (IV SOLUTION) ×3 IMPLANT

## 2021-06-19 NOTE — Transfer of Care (Signed)
Immediate Anesthesia Transfer of Care Note ? ?Patient: Angela Villarreal ? ?Procedure(s) Performed: XI ROBOTIC ASSISTED LAPAROSCOPIC CHOLECYSTECTOMY (Abdomen) ?INDOCYANINE GREEN FLUORESCENCE IMAGING (ICG) ? ?Patient Location: PACU ? ?Anesthesia Type:General ? ?Level of Consciousness: drowsy ? ?Airway & Oxygen Therapy: Patient Spontanous Breathing and Patient connected to face mask oxygen ? ?Post-op Assessment: Report given to RN and Post -op Vital signs reviewed and stable ? ?Post vital signs: Reviewed and stable ? ?Last Vitals:  ?Vitals Value Taken Time  ?BP 158/82 06/19/21 1715  ?Temp 36.8 ?C 06/19/21 1705  ?Pulse 86 06/19/21 1718  ?Resp 11 06/19/21 1718  ?SpO2 93 % 06/19/21 1718  ?Vitals shown include unvalidated device data. ? ?Last Pain:  ?Vitals:  ? 06/19/21 1705  ?TempSrc:   ?PainSc: Asleep  ?   ? ?Patients Stated Pain Goal: 0 (06/19/21 1440) ? ?Complications: No notable events documented. ?

## 2021-06-19 NOTE — Interval H&P Note (Signed)
History and Physical Interval Note: ? ?06/19/2021 ?3:27 PM ? ?Angela Villarreal  has presented today for surgery, with the diagnosis of acute cholecysitis.  The various methods of treatment have been discussed with the patient and family. After consideration of risks, benefits and other options for treatment, the patient has consented to  Procedure(s): ?XI ROBOTIC ASSISTED LAPAROSCOPIC CHOLECYSTECTOMY (N/A) ?INDOCYANINE GREEN FLUORESCENCE IMAGING (ICG) (N/A) as a surgical intervention.  The patient's history has been reviewed, patient examined, no change in status, stable for surgery.  I have reviewed the patient's chart and labs.  Questions were answered to the patient's satisfaction.   ? ? ?Angela Villarreal ? ? ?

## 2021-06-19 NOTE — Anesthesia Procedure Notes (Signed)
Procedure Name: Intubation ?Date/Time: 06/19/2021 4:02 PM ?Performed by: Beverely Low, CRNA ?Pre-anesthesia Checklist: Patient identified, Patient being monitored, Timeout performed, Emergency Drugs available and Suction available ?Patient Re-evaluated:Patient Re-evaluated prior to induction ?Oxygen Delivery Method: Circle system utilized ?Preoxygenation: Pre-oxygenation with 100% oxygen ?Induction Type: IV induction ?Ventilation: Mask ventilation without difficulty ?Laryngoscope Size: 3 and McGraph ?Grade View: Grade I ?Tube type: Oral ?Tube size: 7.0 mm ?Number of attempts: 1 ?Airway Equipment and Method: Stylet ?Placement Confirmation: ETT inserted through vocal cords under direct vision, positive ETCO2 and breath sounds checked- equal and bilateral ?Secured at: 21 cm ?Tube secured with: Tape ?Dental Injury: Teeth and Oropharynx as per pre-operative assessment  ? ? ? ? ?

## 2021-06-19 NOTE — Op Note (Signed)
Preoperative diagnosis: Choledocholithiasis with acute cholecystitis ? ?Postoperative diagnosis: Choledocholithiasis with acute cholecystitis ? ?Procedure: Robotic Assisted Laparoscopic Cholecystectomy.  ? ?Anesthesia: GETA ?  ?Surgeon: Dr. Windell Moment ? ?Wound Classification: Clean Contaminated ? ?Indications: Patient is a 66 y.o. female developed right upper quadrant pain and on workup was found to have dilated common bile duct, cholecystitis and choledocholithiasis.  After ERCP and resolution of choledocholithiasis Robotic Assisted Laparoscopic cholecystectomy was elected. ? ?Findings: ?Pericholecystic edema ?Critical view of safety achieved ?Cystic duct and artery identified, ligated and divided ?Adequate hemostasis ? ? ? ? ? ? ?Description of procedure: The patient was placed on the operating table in the supine position. General anesthesia was induced. A time-out was completed verifying correct patient, procedure, site, positioning, and implant(s) and/or special equipment prior to beginning this procedure. An orogastric tube was placed. The abdomen was prepped and draped in the usual sterile fashion.  ?An incision was made in a natural skin line below the umbilicus.  ?The fascia was elevated and the Veress needle inserted. Proper position was confirmed by aspiration and saline meniscus test.  ?The abdomen was insufflated with carbon dioxide to a pressure of 15 mmHg. The patient tolerated insufflation well. A 8-mm trocar was then inserted in optiview fashion.  ?The laparoscope was inserted and the abdomen inspected. No injuries from initial trocar placement were noted. Additional trocars were then inserted in the following locations: an 8-mm trocar in the left lateral abdomen, and another two 8-mm trocars to the right side of the abdomen 5 cm appart. The umbilical trocar was changed to a 12 mm trocar all under direct visualization. The abdomen was inspected and no abnormalities were found. The table was placed  in the reverse Trendelenburg position with the right side up. The robotic arms were docked and target anatomy identified. Instrument inserted under direct visualization.  ?Filmy adhesions between the gallbladder and omentum, duodenum and transverse colon were lysed with electrocautery. The dome of the gallbladder was grasped with a prograsp and retracted over the dome of the liver. The infundibulum was also grasped with an atraumatic grasper and retracted toward the right lower quadrant. This maneuver exposed Calot?s triangle. The peritoneum overlying the gallbladder infundibulum was then incised and the cystic duct and cystic artery identified and circumferentially dissected. Critical view of safety reviewed before ligating any structure. Firefly images taken to visualize biliary ducts. The cystic duct and cystic artery were then doubly clipped and divided close to the gallbladder.  ?The gallbladder was then dissected from its peritoneal attachments by electrocautery. Hemostasis was checked and the gallbladder and contained stones were removed using an endoscopic retrieval bag. The gallbladder was passed off the table as a specimen.  There was no evidence of bleeding from the gallbladder fossa or cystic artery or leakage of the bile from the cystic duct stump. Secondary trocars were removed under direct vision. No bleeding was noted. The robotic arms were undoked. The scope was withdrawn and the umbilical trocar removed. The abdomen was allowed to collapse. The fascia of the 42m trocar sites was closed with figure-of-eight 0 vicryl sutures. The skin was closed with subcuticular sutures of 4-0 monocryl and topical skin adhesive. The orogastric tube was removed.  ?The patient tolerated the procedure well and was taken to the postanesthesia care unit in stable condition.  ? ?Specimen: Gallbladder ? ?Complications: None ? ?EBL: 5 mL ? ?

## 2021-06-19 NOTE — Anesthesia Preprocedure Evaluation (Addendum)
Anesthesia Evaluation  ?Patient identified by MRN, date of birth, ID band ?Patient awake ? ? ? ?Reviewed: ?Allergy & Precautions, NPO status , Patient's Chart, lab work & pertinent test results ? ?History of Anesthesia Complications ?Negative for: history of anesthetic complications ? ?Airway ?Mallampati: II ? ?TM Distance: <3 FB ?Neck ROM: Full ? ? ? Dental ?no notable dental hx. ?(+) Teeth Intact ?  ?Pulmonary ?asthma , neg sleep apnea, neg COPD, Patient abstained from smoking.Not current smoker,  ?  ?Pulmonary exam normal ?breath sounds clear to auscultation ? ? ? ? ? ? Cardiovascular ?Exercise Tolerance: Good ?METShypertension, (-) CAD and (-) Past MI (-) dysrhythmias  ?Rhythm:Regular Rate:Normal ?- Systolic murmurs ? ?  ?Neuro/Psych ?PSYCHIATRIC DISORDERS Anxiety negative neurological ROS ?   ? GI/Hepatic ?GERD  Controlled,(+)  ?  ? (-) substance abuse ? , Choledolcholithiasis s/p ERCP ?  ?Endo/Other  ?neg diabetesHypothyroidism  ? Renal/GU ?negative Renal ROS  ? ?  ?Musculoskeletal ? ?(+) Arthritis ,  ? Abdominal ?Normal abdominal exam  (+)   ?Peds ? Hematology ?  ?Anesthesia Other Findings ?Hypokalemia ? 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    Comment:  Right clavicle. AMP. Excised: 04/19/2009, margins free. 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 Reproductive/Obstetrics ? ?  ? ? ? ? ? ? ? ? ? ? ? ? ? ?  ?  ? ? ? ? ? ? ? ?Anesthesia Physical ? ?Anesthesia Plan ? ?ASA: 2 ? ?Anesthesia Plan: General  ? ?Post-op Pain Management: Toradol IV (intra-op)*, Ofirmev IV (intra-op)*, Gabapentin PO (pre-op)* and Celebrex PO (pre-op)*  ? ?Induction: Intravenous ? ?PONV Risk Score and Plan: 3 and Ondansetron, Dexamethasone and Midazolam ? ?Airway Management Planned: Oral ETT ? ?Additional Equipment: None ? ?Intra-op Plan:  ? ?Post-operative Plan: Extubation in  OR ? ?Informed Consent: I have reviewed the patients History and Physical, chart, labs and discussed the procedure including the risks, benefits and alternatives for the proposed anesthesia with the patient or authorized representative who has indicated his/her understanding and acceptance.  ? ? ? ?Dental advisory given ? ?Plan Discussed with: CRNA and Anesthesiologist ? ?Anesthesia Plan Comments: (Discussed risks of anesthesia with patient, including possibility of difficulty with spontaneous ventilation under anesthesia necessitating airway intervention, PONV, and rare risks such as cardiac or respiratory or neurological events, and allergic reactions. Discussed the role of CRNA in patient's perioperative care. Patient understands.)  ? ? ? ? ? ?Anesthesia Quick Evaluation ? ?

## 2021-06-19 NOTE — Progress Notes (Signed)
Mobility Specialist - Progress Note ? ? 06/19/21 1300  ?Mobility  ?Activity Ambulated independently in hallway;Stood at bedside  ?Level of Assistance Independent  ?Assistive Device Other (Comment) ?(IV Pole)  ?Distance Ambulated (ft) 400 ft  ?Activity Response Tolerated well  ?$Mobility charge 1 Mobility  ? ? ? ?Pt supine upon arrival using RA with family at bedside. Pt ambulates in hallways indep voicing no complaints. Pt returns to room with needs in reach. ? ?Angela Villarreal ?Mobility Specialist ?06/19/21, 1:44 PM ? ? ? ?

## 2021-06-19 NOTE — Progress Notes (Signed)
?PROGRESS NOTE ? ? ? ?Angela Villarreal  ZYY:[id number] DOB: [birth year] DOA: 06/17/2021 ?PCP: Virginia Crews, MD  ? ? ?Brief Narrative:  ?66 y.o. female with medical history significant for melanoma, hypothyroidism, hypertension, asthma, who presented to the hospital with severe abdominal pain.  Abdominal pain started earlier this morning around 4 AM when it woke her up from sleep.  It was mainly located in the upper abdomen.  It started in the epigastric area and then spread to the left and right upper abdomen.  Eventually, pain "wrapped around to her back ".  It was associated with nausea and there were no known relieving or aggravating factors.  She got some relief when she was given IV Toradol in the emergency room ?  ?She was found to have gallstone cholecystitis and choledocholithiasis.  She was treated with analgesics, empiric IV antibiotics and IV fluids. S/p ERCP on 4/4. ? ?Scheduled for lap chole 4/5 ? ? ?Assessment & Plan: ?  ?Principal Problem: ?  Cholecystitis, acute ?Active Problems: ?  Hypokalemia ?  Choledocholithiasis ? ?Choledocholithiasis ?acute cholecystitis ? S/p ERCP with removal of choledocholithiasis, biliary sphincterotomy and balloon ?extraction.   ?Plan for laparoscopic cholecystectomy  ?Plan: ?NPO for lap chole today ?Continue IVF and abx ?PRN pain control ?  ?Elevated liver enzymes ? This is likely from choledocholithiasis ?Monitor, downtrending ?  ?Hypokalemia ? Monitor and replace PRN. ?  ?Hypertension ? Continue antihypertensives ?  ?History of asthma ? Compensated.  Continue bronchodilators. ? ? ? ?DVT prophylaxis: SQ lovenox ?Code Status: FULL ?Family Communication:None today, offered to call but patient declined ?Disposition Plan: Status is: Inpatient ?Remains inpatient appropriate because: Acute cholecysitits.  Lap chole today ? ? ?Level of care: Med-Surg ? ?Consultants:  ?Gen surg ? ?Procedures:  ?ERCP 4/4 ?Lap chole  4/5 ? ?Antimicrobials: ?Ceftriazone ?Flagyl ? ? ?Subjective: ?Seen and examined.  No complaints.  Denies abd pain ? ?Objective: ?Vitals:  ? 06/18/21 1539 06/18/21 2032 06/19/21 0430 06/19/21 0817  ?BP: (!) 163/77 (!) 158/82 (!) 149/74 (!) 142/91  ?Pulse: 72 76 74 71  ?Resp:  '20 16 16  '$ ?Temp: 97.8 ?F (36.6 ?C) 98.4 ?F (36.9 ?C) 97.8 ?F (36.6 ?C) 99.1 ?F (37.3 ?C)  ?TempSrc:  Oral Oral Oral  ?SpO2: 95% 99% 96% 98%  ?Weight:      ?Height:      ? ? ?Intake/Output Summary (Last 24 hours) at 06/19/2021 1055 ?Last data filed at 06/19/2021 0421 ?Gross per 24 hour  ?Intake 436.06 ml  ?Output --  ?Net 436.06 ml  ? ?Filed Weights  ? 06/17/21 0910 06/18/21 1035  ?Weight: 90.7 kg 90.7 kg  ? ? ?Examination: ? ?General exam: Appears calm and comfortable  ?Respiratory system: Clear to auscultation. Respiratory effort normal. ?Cardiovascular system: S1 & S2 heard, RRR. No JVD, murmurs, rubs, gallops or clicks. No pedal edema. ?Gastrointestinal system: Obese, non tender non distended, Pos bowel sounds ?Central nervous system: Alert and oriented. No focal neurological deficits. ?Extremities: Symmetric 5 x 5 power. ?Skin: No rashes, lesions or ulcers ?Psychiatry: Judgement and insight appear normal. Mood & affect appropriate.  ? ? ? ?Data Reviewed: I have personally reviewed following labs and imaging studies ? ?CBC: ?Recent Labs  ?Lab 06/17/21 ?0912 06/18/21 ?4888 06/19/21 ?9169  ?WBC 8.9 7.9 5.6  ?NEUTROABS  --   --  4.1  ?HGB 12.2 10.8* 10.8*  ?HCT 37.8 33.2* 33.5*  ?MCV 86.3 86.0 87.0  ?PLT 185 159 158  ? ?Basic Metabolic Panel: ?Recent Labs  ?Lab  06/17/21 ?0912 06/18/21 ?8185 06/19/21 ?6314  ?NA 138 138 137  ?K 3.0* 3.6 3.2*  ?CL 101 103 102  ?CO2 '28 27 27  '$ ?GLUCOSE 159* 129* 114*  ?BUN '19 16 9  '$ ?CREATININE 0.79 0.89 0.80  ?CALCIUM 9.1 8.3* 8.4*  ?MG 1.8  --   --   ?PHOS 3.1  --   --   ? ?GFR: ?Estimated Creatinine Clearance: 78 mL/min (by C-G formula based on SCr of 0.8 mg/dL). ?Liver Function Tests: ?Recent Labs  ?Lab 06/17/21 ?0912  06/18/21 ?9702 06/19/21 ?6378  ?AST 183* 404* 120*  ?ALT 77* 511* 307*  ?ALKPHOS 105 123 113  ?BILITOT 1.0 1.8* 1.0  ?PROT 7.4 6.3* 6.5  ?ALBUMIN 4.1 3.4* 3.5  ? ?Recent Labs  ?Lab 06/17/21 ?0912  ?LIPASE 36  ? ?No results for input(s): AMMONIA in the last 168 hours. ?Coagulation Profile: ?No results for input(s): INR, PROTIME in the last 168 hours. ?Cardiac Enzymes: ?No results for input(s): CKTOTAL, CKMB, CKMBINDEX, TROPONINI in the last 168 hours. ?BNP (last 3 results) ?No results for input(s): PROBNP in the last 8760 hours. ?HbA1C: ?No results for input(s): HGBA1C in the last 72 hours. ?CBG: ?No results for input(s): GLUCAP in the last 168 hours. ?Lipid Profile: ?No results for input(s): CHOL, HDL, LDLCALC, TRIG, CHOLHDL, LDLDIRECT in the last 72 hours. ?Thyroid Function Tests: ?No results for input(s): TSH, T4TOTAL, FREET4, T3FREE, THYROIDAB in the last 72 hours. ?Anemia Panel: ?No results for input(s): VITAMINB12, FOLATE, FERRITIN, TIBC, IRON, RETICCTPCT in the last 72 hours. ?Sepsis Labs: ?No results for input(s): PROCALCITON, LATICACIDVEN in the last 168 hours. ? ?No results found for this or any previous visit (from the past 240 hour(s)).  ? ? ? ? ? ?Radiology Studies: ?MR ABDOMEN MRCP WO CONTRAST ? ?Result Date: 06/17/2021 ?CLINICAL DATA:  Right upper quadrant pain EXAM: MRI ABDOMEN WITHOUT CONTRAST  (INCLUDING MRCP) TECHNIQUE: Multiplanar multisequence MR imaging of the abdomen was performed. Heavily T2-weighted images of the biliary and pancreatic ducts were obtained, and three-dimensional MRCP images were rendered by post processing. COMPARISON:  Abdominal ultrasound dated June 17, 2021 FINDINGS: Lower chest: No acute findings. Hepatobiliary: Dilated gallbladder with trace pericholecystic fluid. Dilated common bile duct measuring up to 10 mm. Filling defect of the distal common bile duct measuring 6 mm seen on coronal thins, series 15, image 16. Pancreas: No mass, inflammatory changes, or other  parenchymal abnormality identified. Spleen:  Within normal limits in size and appearance. Adrenals/Urinary Tract: No masses identified. No evidence of hydronephrosis. Stomach/Bowel: Small hiatal hernia. Stomach otherwise unremarkable. Visualized small and large bowel are unremarkable. Vascular/Lymphatic: No pathologically enlarged lymph nodes identified. No abdominal aortic aneurysm demonstrated. Other:  None. Musculoskeletal: No suspicious bone lesions identified. IMPRESSION: 1. Dilated gallbladder with trace pericholecystic fluid, concerning for acute cholecystitis. 2. Dilated common bile duct with filling defect of the distal common bile duct measuring 6 mm, compatible with choledocholithiasis. Electronically Signed   By: Yetta Glassman M.D.   On: 06/17/2021 15:07  ? ?MR 3D Recon At Scanner ? ?Result Date: 06/17/2021 ?CLINICAL DATA:  Right upper quadrant pain EXAM: MRI ABDOMEN WITHOUT CONTRAST  (INCLUDING MRCP) TECHNIQUE: Multiplanar multisequence MR imaging of the abdomen was performed. Heavily T2-weighted images of the biliary and pancreatic ducts were obtained, and three-dimensional MRCP images were rendered by post processing. COMPARISON:  Abdominal ultrasound dated June 17, 2021 FINDINGS: Lower chest: No acute findings. Hepatobiliary: Dilated gallbladder with trace pericholecystic fluid. Dilated common bile duct measuring up to 10 mm. Filling defect  of the distal common bile duct measuring 6 mm seen on coronal thins, series 15, image 16. Pancreas: No mass, inflammatory changes, or other parenchymal abnormality identified. Spleen:  Within normal limits in size and appearance. Adrenals/Urinary Tract: No masses identified. No evidence of hydronephrosis. Stomach/Bowel: Small hiatal hernia. Stomach otherwise unremarkable. Visualized small and large bowel are unremarkable. Vascular/Lymphatic: No pathologically enlarged lymph nodes identified. No abdominal aortic aneurysm demonstrated. Other:  None.  Musculoskeletal: No suspicious bone lesions identified. IMPRESSION: 1. Dilated gallbladder with trace pericholecystic fluid, concerning for acute cholecystitis. 2. Dilated common bile duct with filling defect of the distal common bile duct measuring 6 mm, compatible with choledocholit

## 2021-06-20 LAB — CBC WITH DIFFERENTIAL/PLATELET
Abs Immature Granulocytes: 0.02 10*3/uL (ref 0.00–0.07)
Basophils Absolute: 0 10*3/uL (ref 0.0–0.1)
Basophils Relative: 1 %
Eosinophils Absolute: 0.3 10*3/uL (ref 0.0–0.5)
Eosinophils Relative: 5 %
HCT: 30.5 % — ABNORMAL LOW (ref 36.0–46.0)
Hemoglobin: 9.8 g/dL — ABNORMAL LOW (ref 12.0–15.0)
Immature Granulocytes: 0 %
Lymphocytes Relative: 12 %
Lymphs Abs: 0.7 10*3/uL (ref 0.7–4.0)
MCH: 28.3 pg (ref 26.0–34.0)
MCHC: 32.1 g/dL (ref 30.0–36.0)
MCV: 88.2 fL (ref 80.0–100.0)
Monocytes Absolute: 0.5 10*3/uL (ref 0.1–1.0)
Monocytes Relative: 8 %
Neutro Abs: 4.4 10*3/uL (ref 1.7–7.7)
Neutrophils Relative %: 74 %
Platelets: 153 10*3/uL (ref 150–400)
RBC: 3.46 MIL/uL — ABNORMAL LOW (ref 3.87–5.11)
RDW: 14.1 % (ref 11.5–15.5)
WBC: 5.9 10*3/uL (ref 4.0–10.5)
nRBC: 0 % (ref 0.0–0.2)

## 2021-06-20 LAB — COMPREHENSIVE METABOLIC PANEL
ALT: 188 U/L — ABNORMAL HIGH (ref 0–44)
AST: 55 U/L — ABNORMAL HIGH (ref 15–41)
Albumin: 3.1 g/dL — ABNORMAL LOW (ref 3.5–5.0)
Alkaline Phosphatase: 126 U/L (ref 38–126)
Anion gap: 7 (ref 5–15)
BUN: 10 mg/dL (ref 8–23)
CO2: 26 mmol/L (ref 22–32)
Calcium: 7.9 mg/dL — ABNORMAL LOW (ref 8.9–10.3)
Chloride: 101 mmol/L (ref 98–111)
Creatinine, Ser: 0.9 mg/dL (ref 0.44–1.00)
GFR, Estimated: >60 mL/min (ref >60)
Glucose, Bld: 122 mg/dL — ABNORMAL HIGH (ref 70–99)
Potassium: 3.3 mmol/L — ABNORMAL LOW (ref 3.5–5.1)
Sodium: 134 mmol/L — ABNORMAL LOW (ref 135–145)
Total Bilirubin: 0.8 mg/dL (ref 0.3–1.2)
Total Protein: 6.2 g/dL — ABNORMAL LOW (ref 6.5–8.1)

## 2021-06-20 MED ORDER — ENSURE MAX PROTEIN PO LIQD
11.0000 [oz_av] | Freq: Two times a day (BID) | ORAL | Status: DC
Start: 2021-06-20 — End: 2021-06-20
  Administered 2021-06-20: 11 [oz_av] via ORAL
  Filled 2021-06-20: qty 330

## 2021-06-20 MED ORDER — POTASSIUM CHLORIDE CRYS ER 20 MEQ PO TBCR
40.0000 meq | EXTENDED_RELEASE_TABLET | Freq: Once | ORAL | Status: AC
  Administered 2021-06-20: 40 meq via ORAL
  Filled 2021-06-20: qty 2

## 2021-06-20 MED ORDER — ONDANSETRON HCL 4 MG PO TABS: 4.0000 mg | ORAL_TABLET | Freq: Every day | ORAL | 0 refills | Status: DC | PRN

## 2021-06-20 MED ORDER — HYDROCODONE-ACETAMINOPHEN 5-325 MG PO TABS: 1.0000 | ORAL_TABLET | ORAL | 0 refills | Status: AC | PRN

## 2021-06-20 MED ORDER — ADULT MULTIVITAMIN W/MINERALS CH: 1.0000 | ORAL_TABLET | Freq: Every day | ORAL | Status: DC

## 2021-06-20 NOTE — Progress Notes (Signed)
Patient ID: Angela Villarreal, female   DOB: 07/22/1955, 66 y.o.   MRN: [id number] ?    SURGICAL PROGRESS NOTE  ? ?Hospital Day(s): 3.  ? ?Interval History: Patient seen and examined, no acute events or new complaints overnight. Patient reports feeling well.  She has some minimal soreness on the lower abdomen but no significant pain.  She has been able to ambulate.  She has been tolerating full liquids.  No nausea or vomiting. ? ?Vital signs in last 24 hours: [min-max] current  ?Temp:  [98.2 ?F (36.8 ?C)-99.7 ?F (37.6 ?C)] 99.2 ?F (37.3 ?C) (04/06 0719) ?Pulse Rate:  [66-96] 69 (04/06 0719) ?Resp:  [11-19] 18 (04/06 0719) ?BP: (130-158)/(65-90) 139/90 (04/06 0719) ?SpO2:  [95 %-100 %] 98 % (04/06 0719) ?Weight:  [90.7 kg] 90.7 kg (04/05 1440)     Height: '5\' 5"'$  (165.1 cm) Weight: 90.7 kg BMI (Calculated): 33.27  ? ?Physical Exam:  ?Constitutional: alert, cooperative and no distress  ?Respiratory: breathing non-labored at rest  ?Cardiovascular: regular rate and sinus rhythm  ?Gastrointestinal: soft, non-tender, and non-distended.  The wounds are dry and clean ? ?Labs:  ? ?  Latest Ref Rng & Units 06/20/2021  ?  7:41 AM 06/19/2021  ?  6:12 AM 06/18/2021  ?  5:45 AM  ?CBC  ?WBC 4.0 - 10.5 K/uL 5.9   5.6   7.9    ?Hemoglobin 12.0 - 15.0 g/dL 9.8   10.8   10.8    ?Hematocrit 36.0 - 46.0 % 30.5   33.5   33.2    ?Platelets 150 - 400 K/uL 153   158   159    ? ? ?  Latest Ref Rng & Units 06/20/2021  ?  7:41 AM 06/19/2021  ?  6:12 AM 06/18/2021  ?  5:45 AM  ?CMP  ?Glucose 70 - 99 mg/dL 122   114   129    ?BUN 8 - 23 mg/dL '10   9   16    '$ ?Creatinine 0.44 - 1.00 mg/dL 0.90   0.80   0.89    ?Sodium 135 - 145 mmol/L 134   137   138    ?Potassium 3.5 - 5.1 mmol/L 3.3   3.2   3.6    ?Chloride 98 - 111 mmol/L 101   102   103    ?CO2 22 - 32 mmol/L '26   27   27    '$ ?Calcium 8.9 - 10.3 mg/dL 7.9   8.4   8.3    ?Total Protein 6.5 - 8.1 g/dL 6.2   6.5   6.3    ?Total Bilirubin 0.3 - 1.2 mg/dL 0.8   1.0   1.8    ?Alkaline Phos 38 - 126 U/L 126   113    123    ?AST 15 - 41 U/L 55   120   404    ?ALT 0 - 44 U/L 188   307   511    ? ? ?Imaging studies: No new pertinent imaging studies ? ? ?Assessment/Plan:  ?66 y.o. female with choledocholithiasis with acute cholecystitis 1 Day Post-Op s/p ERCP on 06/18/2021 and robotic assisted laparoscopic cholecystectomy on 06/19/2021. ? ?The patient is recovering adequately.  The pain is controlled.  She has been ambulating.  No sign of any complications.  Labs today shows normal bilirubin.  Normal white blood cell count. ? ?From a standpoint patient can be discharged I will follow her in my office in 2 weeks.  I sent a prescription of pain medication as needed.  I discussed with patient to try to maintain a low-fat diet.  We discussed about physical restriction mainly as tolerated by pain. ? ? ?Gwendolyn Grant, MD ? ? ? ?

## 2021-06-20 NOTE — Care Management Important Message (Signed)
Important Message ? ?Patient Details  ?Name: Angela Villarreal ?MRN: [id number] ?Date of Birth: 10-13-1955 ? ? ?Medicare Important Message Given:  Yes ? ? ? ? ?Angela Villarreal ?06/20/2021, 12:56 PM ?

## 2021-06-20 NOTE — Plan of Care (Signed)
?  Problem: Education: ?Goal: Knowledge of General Education information will improve ?Description: Including pain rating scale, medication(s)/side effects and non-pharmacologic comfort measures ?06/20/2021 1423 by Evelena Peat, RN ?Outcome: Completed/Met ?06/20/2021 1031 by Evelena Peat, RN ?Outcome: Progressing ?  ?Problem: Health Behavior/Discharge Planning: ?Goal: Ability to manage health-related needs will improve ?06/20/2021 1423 by Evelena Peat, RN ?Outcome: Completed/Met ?06/20/2021 1031 by Evelena Peat, RN ?Outcome: Progressing ?  ?Problem: Clinical Measurements: ?Goal: Ability to maintain clinical measurements within normal limits will improve ?06/20/2021 1423 by Evelena Peat, RN ?Outcome: Completed/Met ?06/20/2021 1031 by Evelena Peat, RN ?Outcome: Progressing ?Goal: Will remain free from infection ?06/20/2021 1423 by Evelena Peat, RN ?Outcome: Completed/Met ?06/20/2021 1031 by Evelena Peat, RN ?Outcome: Progressing ?Goal: Diagnostic test results will improve ?06/20/2021 1423 by Evelena Peat, RN ?Outcome: Completed/Met ?06/20/2021 1031 by Evelena Peat, RN ?Outcome: Progressing ?Goal: Respiratory complications will improve ?06/20/2021 1423 by Evelena Peat, RN ?Outcome: Completed/Met ?06/20/2021 1031 by Evelena Peat, RN ?Outcome: Progressing ?Goal: Cardiovascular complication will be avoided ?06/20/2021 1423 by Evelena Peat, RN ?Outcome: Completed/Met ?06/20/2021 1031 by Evelena Peat, RN ?Outcome: Progressing ?  ?Problem: Activity: ?Goal: Risk for activity intolerance will decrease ?06/20/2021 1423 by Evelena Peat, RN ?Outcome: Completed/Met ?06/20/2021 1031 by Evelena Peat, RN ?Outcome: Progressing ?  ?Problem: Nutrition: ?Goal: Adequate nutrition will be maintained ?06/20/2021 1423 by Evelena Peat, RN ?Outcome: Completed/Met ?06/20/2021 1031 by Evelena Peat, RN ?Outcome: Progressing ?  ?Problem: Coping: ?Goal: Level of anxiety will  decrease ?06/20/2021 1423 by Evelena Peat, RN ?Outcome: Completed/Met ?06/20/2021 1031 by Evelena Peat, RN ?Outcome: Progressing ?  ?Problem: Elimination: ?Goal: Will not experience complications related to bowel motility ?06/20/2021 1423 by Evelena Peat, RN ?Outcome: Completed/Met ?06/20/2021 1031 by Evelena Peat, RN ?Outcome: Progressing ?Goal: Will not experience complications related to urinary retention ?06/20/2021 1423 by Evelena Peat, RN ?Outcome: Completed/Met ?06/20/2021 1031 by Evelena Peat, RN ?Outcome: Progressing ?  ?Problem: Pain Managment: ?Goal: General experience of comfort will improve ?06/20/2021 1423 by Evelena Peat, RN ?Outcome: Completed/Met ?06/20/2021 1031 by Evelena Peat, RN ?Outcome: Progressing ?  ?Problem: Safety: ?Goal: Ability to remain free from injury will improve ?06/20/2021 1423 by Evelena Peat, RN ?Outcome: Completed/Met ?06/20/2021 1031 by Evelena Peat, RN ?Outcome: Progressing ?  ?Problem: Skin Integrity: ?Goal: Risk for impaired skin integrity will decrease ?06/20/2021 1423 by Evelena Peat, RN ?Outcome: Completed/Met ?06/20/2021 1031 by Evelena Peat, RN ?Outcome: Progressing ?  ?Problem: Increased Nutrient Needs (NI-5.1) ?Goal: Food and/or nutrient delivery ?Description: Individualized approach for food/nutrient provision. ?Outcome: Completed/Met ?  ?

## 2021-06-20 NOTE — Anesthesia Postprocedure Evaluation (Signed)
Anesthesia Post Note ? ?Patient: Angela Villarreal ? ?Procedure(s) Performed: XI ROBOTIC ASSISTED LAPAROSCOPIC CHOLECYSTECTOMY (Abdomen) ?INDOCYANINE GREEN FLUORESCENCE IMAGING (ICG) ? ?Patient location during evaluation: PACU ?Anesthesia Type: General ?Level of consciousness: awake and alert ?Pain management: pain level controlled ?Vital Signs Assessment: post-procedure vital signs reviewed and stable ?Respiratory status: spontaneous breathing, nonlabored ventilation and respiratory function stable ?Cardiovascular status: blood pressure returned to baseline and stable ?Postop Assessment: no apparent nausea or vomiting ?Anesthetic complications: no ? ? ?No notable events documented. ? ? ?Last Vitals:  ?Vitals:  ? 06/20/21 0550 06/20/21 0719  ?BP: 136/76 139/90  ?Pulse: 84 69  ?Resp: 18 18  ?Temp: 36.8 ?C 37.3 ?C  ?SpO2: 97% 98%  ?  ?Last Pain:  ?Vitals:  ? 06/20/21 0900  ?TempSrc:   ?PainSc: 0-No pain  ? ? ?  ?  ?  ?  ?  ?  ? ?Iran Ouch ? ? ? ? ?

## 2021-06-20 NOTE — Progress Notes (Signed)
Patient discharged to home with al belongings. Patient given discharge instructions and medication instructions. No complaints or questions at this time. PIVX1 removed with catheter intact . ?

## 2021-06-20 NOTE — Plan of Care (Signed)

## 2021-06-20 NOTE — Addendum Note (Signed)
Addendum  created 06/20/21 1247 by Iran Ouch, MD  ? Clinical Note Signed, Review and Sign - Ready for Procedure  ?  ?

## 2021-06-20 NOTE — Discharge Summary (Signed)
Physician Discharge Summary  ?Angela Villarreal TOI:[id number] DOB: 1955-10-16 DOA: 06/17/2021 ? ?PCP: Virginia Crews, MD ? ?Admit date: 06/17/2021 ?Discharge date: 06/20/2021 ? ?Admitted From: Home ?Disposition: Home ? ?Recommendations for Outpatient Follow-up:  ?Follow up with PCP in 1-2 weeks ?Follow-up with general surgery Dr. Peyton Najjar as directed ? ?Home Health: No ?Equipment/Devices: None ? ?Discharge Condition: Stable ?CODE STATUS: Full ?Diet recommendation: Regular ? ?Brief/Interim Summary: ?66 y.o. female with medical history significant for melanoma, hypothyroidism, hypertension, asthma, who presented to the hospital with severe abdominal pain.  Abdominal pain started earlier this morning around 4 AM when it woke her up from sleep.  It was mainly located in the upper abdomen.  It started in the epigastric area and then spread to the left and right upper abdomen.  Eventually, pain "wrapped around to her back ".  It was associated with nausea and there were no known relieving or aggravating factors.  She got some relief when she was given IV Toradol in the emergency room ?  ?She was found to have gallstone cholecystitis and choledocholithiasis.  She was treated with analgesics, empiric IV antibiotics and IV fluids. S/p ERCP on 4/4. ?  ?Status post laparoscopic cholecystectomy 4/5.  Tolerated procedure well with no immediate complications.  Seen in consultation by general surgery on postoperative day #1.  Cleared for discharge.  No occasion for antibiotics at time of discharge.  DC home with outpatient surgical and PCP follow-up. ? ? ? ?Discharge Diagnoses:  ?Principal Problem: ?  Cholecystitis, acute ?Active Problems: ?  Hypokalemia ?  Choledocholithiasis ? ?Choledocholithiasis ?acute cholecystitis ? S/p ERCP with removal of choledocholithiasis, biliary sphincterotomy and balloon ?extraction.   ?Status post laparoscopic cholecystectomy 4/5 ?Tolerated procedure well with no complications ?Postoperative pain  mild ?Plan: ?Discharge home.  As needed pain control and nausea control prescribed.  Regular diet okay.  Follow-up outpatient general surgery and PCP.  No indication for antibiotics at time of discharge ?  ?Elevated liver enzymes ? This is likely from choledocholithiasis ?Downtrending at time of discharge ?  ?Hypokalemia ? Monitor and replace PRN. ?  ?Hypertension ? Continue antihypertensives ?  ?History of asthma ? Compensated.  Continue bronchodilators. ? ?Discharge Instructions ? ?Discharge Instructions   ? ? Diet - low sodium heart healthy   Complete by: As directed ?  ? Increase activity slowly   Complete by: As directed ?  ? No wound care   Complete by: As directed ?  ? ?  ? ?Allergies as of 06/20/2021   ? ?   Reactions  ? Amoxicillin   ? Other reaction(s): Rash, Hives ()  ? Gentamicin Sulfate   ? Lisinopril Cough  ? ?  ? ?  ?Medication List  ?  ? ?TAKE these medications   ? ?albuterol 108 (90 Base) MCG/ACT inhaler ?Commonly known as: VENTOLIN HFA ?Inhale 1-2 puffs into the lungs every 4 (four) hours as needed for wheezing or shortness of breath. ?  ?Allergy Relief 10 MG tablet ?Generic drug: loratadine ?TAKE 1 TABLET BY MOUTH DAILY. ?  ?diltiazem 120 MG 24 hr capsule ?Commonly known as: CARDIZEM CD ?TAKE 1 CAPSULE (120 MG) BY MOUTH EVERY DAY ?  ?FLUTICASONE PROPIONATE (NASAL) NA ?Place into the nose. ?  ?fluticasone 50 MCG/ACT nasal spray ?Commonly known as: FLONASE ?Place 2 sprays into both nostrils daily. ?  ?fluticasone-salmeterol 115-21 MCG/ACT inhaler ?Commonly known as: ADVAIR HFA ?Inhale 2 puffs into the lungs 2 (two) times daily. ?  ?HYDROcodone-acetaminophen 5-325 MG tablet ?Commonly known  as: Norco ?Take 1 tablet by mouth every 4 (four) hours as needed for up to 3 days for moderate pain. ?  ?hyoscyamine 0.375 MG 12 hr tablet ?Commonly known as: LEVBID ?TAKE ONE TABLET BY MOUTH TWICE DAILY ?  ?levothyroxine 100 MCG tablet ?Commonly known as: SYNTHROID ?Take 1 tablet (100 mcg total) by mouth daily. ?   ?montelukast 10 MG tablet ?Commonly known as: SINGULAIR ?Take 1 tablet (10 mg total) by mouth at bedtime. ?  ?naproxen sodium 220 MG tablet ?Commonly known as: ALEVE ?Take 220 mg by mouth 2 (two) times daily as needed. ?  ?omeprazole 40 MG capsule ?Commonly known as: PRILOSEC ?Take 40 mg by mouth daily. ?  ?ondansetron 4 MG tablet ?Commonly known as: Zofran ?Take 1 tablet (4 mg total) by mouth daily as needed for nausea or vomiting. ?  ?potassium chloride SA 20 MEQ tablet ?Commonly known as: KLOR-CON M ?TAKE ONE TABLET TWICE DAILY ?  ?rosuvastatin 5 MG tablet ?Commonly known as: CRESTOR ?TAKE 1 TABLET BY MOUTH DAILY ?  ?sertraline 50 MG tablet ?Commonly known as: ZOLOFT ?TAKE 1 TABLET BY MOUTH DAILY. ?  ?triamterene-hydrochlorothiazide 37.5-25 MG tablet ?Commonly known as: MAXZIDE-25 ?TAKE 1 TABLET BY MOUTH DAILY ?  ? ?  ? ? Follow-up Information   ? ? Herbert Pun, MD Follow up in 2 week(s).   ?Specialty: General Surgery ?Why: Follow up from cholecystectomy ?Contact information: ?[address] ?[place] [address] ?[phone] ? ? ?  ?  ? ?  ?  ? ?  ? ?Allergies  ?Allergen Reactions  ? Amoxicillin   ?  Other reaction(s): Rash, Hives ()  ? Gentamicin Sulfate   ? Lisinopril Cough  ? ? ?Consultations: ?General surgery ? ? ?Procedures/Studies: ?MR ABDOMEN MRCP WO CONTRAST ? ?Result Date: 06/17/2021 ?CLINICAL DATA:  Right upper quadrant pain EXAM: MRI ABDOMEN WITHOUT CONTRAST  (INCLUDING MRCP) TECHNIQUE: Multiplanar multisequence MR imaging of the abdomen was performed. Heavily T2-weighted images of the biliary and pancreatic ducts were obtained, and three-dimensional MRCP images were rendered by post processing. COMPARISON:  Abdominal ultrasound dated June 17, 2021 FINDINGS: Lower chest: No acute findings. Hepatobiliary: Dilated gallbladder with trace pericholecystic fluid. Dilated common bile duct measuring up to 10 mm. Filling defect of the distal common bile duct measuring 6 mm seen on coronal  thins, series 15, image 16. Pancreas: No mass, inflammatory changes, or other parenchymal abnormality identified. Spleen:  Within normal limits in size and appearance. Adrenals/Urinary Tract: No masses identified. No evidence of hydronephrosis. Stomach/Bowel: Small hiatal hernia. Stomach otherwise unremarkable. Visualized small and large bowel are unremarkable. Vascular/Lymphatic: No pathologically enlarged lymph nodes identified. No abdominal aortic aneurysm demonstrated. Other:  None. Musculoskeletal: No suspicious bone lesions identified. IMPRESSION: 1. Dilated gallbladder with trace pericholecystic fluid, concerning for acute cholecystitis. 2. Dilated common bile duct with filling defect of the distal common bile duct measuring 6 mm, compatible with choledocholithiasis. Electronically Signed   By: Yetta Glassman M.D.   On: 06/17/2021 15:07  ? ?MR 3D Recon At Scanner ? ?Result Date: 06/17/2021 ?CLINICAL DATA:  Right upper quadrant pain EXAM: MRI ABDOMEN WITHOUT CONTRAST  (INCLUDING MRCP) TECHNIQUE: Multiplanar multisequence MR imaging of the abdomen was performed. Heavily T2-weighted images of the biliary and pancreatic ducts were obtained, and three-dimensional MRCP images were rendered by post processing. COMPARISON:  Abdominal ultrasound dated June 17, 2021 FINDINGS: Lower chest: No acute findings. Hepatobiliary: Dilated gallbladder with trace pericholecystic fluid. Dilated common bile duct measuring up to 10 mm. Filling defect  of the distal common bile duct measuring 6 mm seen on coronal thins, series 15, image 16. Pancreas: No mass, inflammatory changes, or other parenchymal abnormality identified. Spleen:  Within normal limits in size and appearance. Adrenals/Urinary Tract: No masses identified. No evidence of hydronephrosis. Stomach/Bowel: Small hiatal hernia. Stomach otherwise unremarkable. Visualized small and large bowel are unremarkable. Vascular/Lymphatic: No pathologically enlarged lymph nodes  identified. No abdominal aortic aneurysm demonstrated. Other:  None. Musculoskeletal: No suspicious bone lesions identified. IMPRESSION: 1. Dilated gallbladder with trace pericholecystic fluid, concerning for acute ch

## 2021-06-21 ENCOUNTER — Telehealth: Payer: Self-pay

## 2021-06-21 LAB — SURGICAL PATHOLOGY

## 2021-06-21 NOTE — Telephone Encounter (Signed)
Transition Care Management Unsuccessful Follow-up Telephone Call ? ?Date of discharge and from where:  06/20/21 [facility] ? ?Attempts:  1st Attempt ? ?Reason for unsuccessful TCM follow-up call:  Left voice message for patient to contact office to schedule appt.  ? ?  ?

## 2021-06-21 NOTE — Telephone Encounter (Signed)
Copied from [place] [id number]. Topic: General - Other ?>> Jun 21, 2021 12:03 PM Tessa Lerner A wrote: ?Reason for CRM: The patient has returned the missed call from [place]. ? ?The patient has declined to schedule a hospital follow up at the time of call with agent  ? ?The patient will be scheduling a follow up appointment with their surgeon  ? ?Please contact further if needed ?

## 2021-06-24 NOTE — Telephone Encounter (Signed)
Noted  

## 2021-07-16 ENCOUNTER — Other Ambulatory Visit: Payer: Self-pay | Admitting: Family Medicine

## 2021-07-16 DIAGNOSIS — I10 Essential (primary) hypertension: Secondary | ICD-10-CM

## 2021-07-17 NOTE — Telephone Encounter (Signed)
Requested medication (s) are due for refill today - yes ? ?Requested medication (s) are on the active medication list -yes ? ?Future visit scheduled -yes ? ?Last refill: 03/15/21 #90 ? ?Notes to clinic: Request RF: changes in labs since last OV- sent for PCP review  ? ?Requested Prescriptions  ?Pending Prescriptions Disp Refills  ? diltiazem (CARDIZEM CD) 120 MG 24 hr capsule [Pharmacy Med Name: DILTIAZEM HCL ER COATED BEADS 120 M] 90 capsule 0  ?  Sig: TAKE 1 CAPSULE (120 MG) BY MOUTH EVERY DAY  ?  ? Cardiovascular: Calcium Channel Blockers 3 Failed - 07/16/2021  4:19 PM  ?  ?  Failed - ALT in normal range and within 360 days  ?  ALT  ?Date Value Ref Range Status  ?06/20/2021 188 (H) 0 - 44 U/L Final  ?  ?  ?  ?  Failed - AST in normal range and within 360 days  ?  AST  ?Date Value Ref Range Status  ?06/20/2021 55 (H) 15 - 41 U/L Final  ?  ?  ?  ?  Failed - Last BP in normal range  ?  BP Readings from Last 1 Encounters:  ?06/20/21 139/90  ?  ?  ?  ?  Passed - Cr in normal range and within 360 days  ?  Creatinine, Ser  ?Date Value Ref Range Status  ?06/20/2021 0.90 0.44 - 1.00 mg/dL Final  ?  ?  ?  ?  Passed - Last Heart Rate in normal range  ?  Pulse Readings from Last 1 Encounters:  ?06/20/21 69  ?  ?  ?  ?  Passed - Valid encounter within last 6 months  ?  Recent Outpatient Visits   ? ?      ? 1 month ago Welcome to [employer] preventive visit  ? [facility] [place], Dionne Bucy, MD  ? 3 months ago Acute non-recurrent maxillary sinusitis  ? [place], DO  ? 5 months ago Adult hypothyroidism  ? [facility] [place], Dionne Bucy, MD  ? 8 months ago Prediabetes  ? [facility] [place], Dionne Bucy, MD  ? 10 months ago Viral upper respiratory tract infection  ? [place], [place] P, DO  ? ?  ?  ?Future Appointments   ? ?        ? In 1 month Bacigal[place], Dionne Bucy, MD [facility]  ? ?  ? ? ?  ?  ?  ?  triamterene-hydrochlorothiazide (MAXZIDE-25) 37.5-25 MG tablet [Pharmacy Med Name: TRIAMTERENE-HCTZ 37.5-25 MG TAB] 90 tablet 0  ?  Sig: TAKE 1 TABLET BY MOUTH DAILY  ?  ? Cardiovascular: Diuretic Combos Failed - 07/16/2021  4:19 PM  ?  ?  Failed - K in normal range and within 180 days  ?  Potassium  ?Date Value Ref Range Status  ?06/20/2021 3.3 (L) 3.5 - 5.1 mmol/L Final  ?  ?  ?  ?  Failed - Na in normal range and within 180 days  ?  Sodium  ?Date Value Ref Range Status  ?06/20/2021 134 (L) 135 - 145 mmol/L Final  ?02/15/2021 141 134 - 144 mmol/L Final  ?  ?  ?  ?  Failed - Last BP in normal range  ?  BP Readings from Last 1 Encounters:  ?06/20/21 139/90  ?  ?  ?  ?  Passed - Cr in normal range and within  180 days  ?  Creatinine, Ser  ?Date Value Ref Range Status  ?06/20/2021 0.90 0.44 - 1.00 mg/dL Final  ?  ?  ?  ?  Passed - Valid encounter within last 6 months  ?  Recent Outpatient Visits   ? ?      ? 1 month ago Welcome to [employer] preventive visit  ? [facility] [place], Dionne Bucy, MD  ? 3 months ago Acute non-recurrent maxillary sinusitis  ? [place], DO  ? 5 months ago Adult hypothyroidism  ? [facility] [place], Dionne Bucy, MD  ? 8 months ago Prediabetes  ? [facility] [place], Dionne Bucy, MD  ? 10 months ago Viral upper respiratory tract infection  ? [place], [place] P, DO  ? ?  ?  ?Future Appointments   ? ?        ? In 1 month Bacigalupo, Dionne Bucy, MD [facility], [facility]  ? ?  ? ? ?  ?  ?  ? ? ? ?Requested Prescriptions  ?Pending Prescriptions Disp Refills  ? diltiazem (CARDIZEM CD) 120 MG 24 hr capsule [Pharmacy Med Name: DILTIAZEM HCL ER COATED BEADS 120 M] 90 capsule 0  ?  Sig: TAKE 1 CAPSULE (120 MG) BY MOUTH EVERY DAY  ?  ? Cardiovascular: Calcium Channel Blockers 3 Failed - 07/16/2021  4:19 PM  ?  ?  Failed - ALT in normal range and within 360 days  ?  ALT  ?Date Value Ref Range Status   ?06/20/2021 188 (H) 0 - 44 U/L Final  ?  ?  ?  ?  Failed - AST in normal range and within 360 days  ?  AST  ?Date Value Ref Range Status  ?06/20/2021 55 (H) 15 - 41 U/L Final  ?  ?  ?  ?  Failed - Last BP in normal range  ?  BP Readings from Last 1 Encounters:  ?06/20/21 139/90  ?  ?  ?  ?  Passed - Cr in normal range and within 360 days  ?  Creatinine, Ser  ?Date Value Ref Range Status  ?06/20/2021 0.90 0.44 - 1.00 mg/dL Final  ?  ?  ?  ?  Passed - Last Heart Rate in normal range  ?  Pulse Readings from Last 1 Encounters:  ?06/20/21 69  ?  ?  ?  ?  Passed - Valid encounter within last 6 months  ?  Recent Outpatient Visits   ? ?      ? 1 month ago Welcome to [employer] preventive visit  ? [facility] [place], Dionne Bucy, MD  ? 3 months ago Acute non-recurrent maxillary sinusitis  ? [place], DO  ? 5 months ago Adult hypothyroidism  ? [facility] [place], Dionne Bucy, MD  ? 8 months ago Prediabetes  ? [facility] [place], Dionne Bucy, MD  ? 10 months ago Viral upper respiratory tract infection  ? [place], [place] P, DO  ? ?  ?  ?Future Appointments   ? ?        ? In 1 month Bacigalupo, Dionne Bucy, MD [facility], [facility]  ? ?  ? ? ?  ?  ?  ? triamterene-hydrochlorothiazide (MAXZIDE-25) 37.5-25 MG tablet [Pharmacy Med Name: TRIAMTERENE-HCTZ 37.5-25 MG TAB] 90 tablet 0  ?  Sig: TAKE 1 TABLET BY MOUTH DAILY  ?  ?  Cardiovascular: Diuretic Combos Failed - 07/16/2021  4:19 PM  ?  ?  Failed - K in normal range and within 180 days  ?  Potassium  ?Date Value Ref Range Status  ?06/20/2021 3.3 (L) 3.5 - 5.1 mmol/L Final  ?  ?  ?  ?  Failed - Na in normal range and within 180 days  ?  Sodium  ?Date Value Ref Range Status  ?06/20/2021 134 (L) 135 - 145 mmol/L Final  ?02/15/2021 141 134 - 144 mmol/L Final  ?  ?  ?  ?  Failed - Last BP in normal range  ?  BP Readings from Last 1 Encounters:  ?06/20/21 139/90  ?  ?  ?  ?   Passed - Cr in normal range and within 180 days  ?  Creatinine, Ser  ?Date Value Ref Range Status  ?06/20/2021 0.90 0.44 - 1.00 mg/dL Final  ?  ?  ?  ?  Passed - Valid encounter within last 6 months  ?  Recent Outpatient Visits   ? ?      ? 1 month ago Welcome to [employer] preventive visit  ? [facility] [place], Dionne Bucy, MD  ? 3 months ago Acute non-recurrent maxillary sinusitis  ? [place], DO  ? 5 months ago Adult hypothyroidism  ? [facility] [place], Dionne Bucy, MD  ? 8 months ago Prediabetes  ? [facility] [place], Dionne Bucy, MD  ? 10 months ago Viral upper respiratory tract infection  ? [place], [place] P, DO  ? ?  ?  ?Future Appointments   ? ?        ? In 1 month Bacigalupo, Dionne Bucy, MD [facility]  ? ?  ? ? ?  ?  ?  ? ? ? ?

## 2021-07-30 ENCOUNTER — Ambulatory Visit
Admission: RE | Admit: 2021-07-30 | Discharge: 2021-07-30 | Disposition: A | Discharge status: Home / Self Care | Payer: Medicare PPO | Source: Ambulatory Visit | Attending: Family Medicine | Admitting: Family Medicine

## 2021-07-30 DIAGNOSIS — Z1231 Encounter for screening mammogram for malignant neoplasm of breast: Secondary | ICD-10-CM | POA: Insufficient documentation

## 2021-07-30 DIAGNOSIS — Z780 Asymptomatic menopausal state: Secondary | ICD-10-CM | POA: Diagnosis not present

## 2021-07-30 DIAGNOSIS — J4540 Moderate persistent asthma, uncomplicated: Secondary | ICD-10-CM | POA: Diagnosis not present

## 2021-08-14 ENCOUNTER — Other Ambulatory Visit: Payer: Self-pay | Admitting: Family Medicine

## 2021-08-21 DIAGNOSIS — M25511 Pain in right shoulder: Secondary | ICD-10-CM | POA: Diagnosis not present

## 2021-08-21 DIAGNOSIS — M62830 Muscle spasm of back: Secondary | ICD-10-CM | POA: Diagnosis not present

## 2021-08-21 DIAGNOSIS — M5412 Radiculopathy, cervical region: Secondary | ICD-10-CM | POA: Diagnosis not present

## 2021-08-21 DIAGNOSIS — M9901 Segmental and somatic dysfunction of cervical region: Secondary | ICD-10-CM | POA: Diagnosis not present

## 2021-08-24 DIAGNOSIS — M9901 Segmental and somatic dysfunction of cervical region: Secondary | ICD-10-CM | POA: Diagnosis not present

## 2021-08-24 DIAGNOSIS — M25511 Pain in right shoulder: Secondary | ICD-10-CM | POA: Diagnosis not present

## 2021-08-24 DIAGNOSIS — M62830 Muscle spasm of back: Secondary | ICD-10-CM | POA: Diagnosis not present

## 2021-08-24 DIAGNOSIS — M5412 Radiculopathy, cervical region: Secondary | ICD-10-CM | POA: Diagnosis not present

## 2021-08-27 DIAGNOSIS — M5412 Radiculopathy, cervical region: Secondary | ICD-10-CM | POA: Diagnosis not present

## 2021-08-27 DIAGNOSIS — M9901 Segmental and somatic dysfunction of cervical region: Secondary | ICD-10-CM | POA: Diagnosis not present

## 2021-08-27 DIAGNOSIS — M62830 Muscle spasm of back: Secondary | ICD-10-CM | POA: Diagnosis not present

## 2021-08-27 DIAGNOSIS — M25511 Pain in right shoulder: Secondary | ICD-10-CM | POA: Diagnosis not present

## 2021-08-27 NOTE — Progress Notes (Signed)
Established patient visit   Patient: Angela Villarreal   DOB: [birth year]   66 y.o. Female  MRN: [id number] Visit Date: 08/30/2021  Today's healthcare provider: [name], MD   Chief Complaint  Patient presents with   Hypertension   Anxiety   Subjective    HPI  Hypertension, follow-up  BP Readings from Last 3 Encounters:  08/30/21 (!) 160/90  06/20/21 139/90  05/28/21 113/76   Wt Readings from Last 3 Encounters:  08/30/21 200 lb (90.7 kg)  06/19/21 199 lb 15.3 oz (90.7 kg)  05/28/21 198 lb 1.6 oz (89.9 kg)     She was last seen for hypertension 6 months ago.  BP at that visit was 136/82. Management since that visit includes Continue current medications.  She reports good compliance with treatment. She is not having side effects.    Symptoms: No chest pain No chest pressure  No palpitations No syncope  No dyspnea No orthopnea  No paroxysmal nocturnal dyspnea No lower extremity edema   Pertinent labs Lab Results  Component Value Date   CHOL 143 11/02/2020   HDL 42 11/02/2020   LDLCALC 78 11/02/2020   TRIG 132 11/02/2020   CHOLHDL 3.4 11/02/2020   Lab Results  Component Value Date   NA 134 (L) 06/20/2021   K 3.3 (L) 06/20/2021   CREATININE 0.90 06/20/2021   GFRNONAA >60 06/20/2021   GLUCOSE 122 (H) 06/20/2021   TSH 0.929 02/12/2021     The 10-year ASCVD risk score (Arnett DK, et al., 2019) is: 10.8%  ---------------------------------------------------------------------------------------------------  Hypothyroid, follow-up  Lab Results  Component Value Date   TSH 0.929 02/12/2021   TSH 0.322 (L) 11/02/2020   TSH 0.181 (L) 08/09/2020   T4TOTAL 8.4 09/04/2016    She was last seen for hypothyroid 6 months ago.  Management since that visit includes Continue Synthroid 116mg.  Symptoms: No change in energy level No constipation  No diarrhea No heat / cold intolerance  No nervousness No palpitations  No weight changes     -----------------------------------------------------------------------------------------  Anxiety, Follow-up  She was last seen for anxiety 6 months ago. Changes made at last visit include Continue Zoloft '50mg'$   PHQ-9 score 1, GAD-& score 1 - can consider taper at next visit.    Symptoms: No chest pain No difficulty concentrating  No dizziness No fatigue  No feelings of losing control No insomnia  No irritable No palpitations  No panic attacks No racing thoughts  No shortness of breath No sweating  No tremors/shakes    GAD-7 Results    08/30/2021    9:47 AM 02/15/2021    9:59 AM 08/09/2020    3:33 PM  GAD-7 Generalized Anxiety Disorder Screening Tool  1. Feeling Nervous, Anxious, or on Edge 0 1 0  2. Not Being Able to Stop or Control Worrying 0 0 0  3. Worrying Too Much About Different Things 0 0 0  4. Trouble Relaxing 0 0 0  5. Being So Restless it's Hard To Sit Still 0 0 0  6. Becoming Easily Annoyed or Irritable 0 0 0  7. Feeling Afraid As If Something Awful Might Happen 0 0 0  Total GAD-7 Score 0 1 0  Difficulty At Work, Home, or Getting  Along With Others? Not difficult at all Not difficult at all Not difficult at all    PHQ-9 Scores    08/30/2021    9:47 AM 05/28/2021   11:08 AM 02/15/2021   10:10 AM  PHQ9 SCORE ONLY  PHQ-9 Total Score '2 2 1    '$ ---------------------------------------------------------------------------------------------------  Lipid/Cholesterol, Follow-up  Last lipid panel Other pertinent labs  Lab Results  Component Value Date   CHOL 143 11/02/2020   HDL 42 11/02/2020   LDLCALC 78 11/02/2020   TRIG 132 11/02/2020   CHOLHDL 3.4 11/02/2020   Lab Results  Component Value Date   ALT 188 (H) 06/20/2021   AST 55 (H) 06/20/2021   PLT 153 06/20/2021   TSH 0.929 02/12/2021     She was last seen for this 6 months ago.  Management since that visit includes Continue Statin.   Symptoms: No chest pain No chest pressure/discomfort  No  dyspnea No lower extremity edema  No numbness or tingling of extremity No orthopnea  No palpitations No paroxysmal nocturnal dyspnea  No speech difficulty No syncope   The 10-year ASCVD risk score (Arnett DK, et al., 2019) is: 10.8%   Having pain in L base of thumb x several months. R hand dominant. ---------------------------------------------------------------------------------------------------   Medications: Outpatient Medications Prior to Visit  Medication Sig   albuterol (VENTOLIN HFA) 108 (90 Base) MCG/ACT inhaler Inhale 1-2 puffs into the lungs every 4 (four) hours as needed for wheezing or shortness of breath.   ALLERGY RELIEF 10 MG tablet TAKE 1 TABLET BY MOUTH DAILY.   diltiazem (CARDIZEM CD) 120 MG 24 hr capsule TAKE 1 CAPSULE (120 MG) BY MOUTH EVERY DAY   fluticasone (FLONASE) 50 MCG/ACT nasal spray Place 2 sprays into both nostrils daily.   FLUTICASONE PROPIONATE, NASAL, NA Place into the nose.   fluticasone-salmeterol (ADVAIR HFA) 115-21 MCG/ACT inhaler Inhale 2 puffs into the lungs 2 (two) times daily.   hyoscyamine (LEVBID) 0.375 MG 12 hr tablet TAKE ONE TABLET BY MOUTH TWICE DAILY   levothyroxine (SYNTHROID) 100 MCG tablet Take 1 tablet (100 mcg total) by mouth daily.   montelukast (SINGULAIR) 10 MG tablet Take 1 tablet (10 mg total) by mouth at bedtime.   naproxen sodium (ALEVE) 220 MG tablet Take 220 mg by mouth 2 (two) times daily as needed.   omeprazole (PRILOSEC) 40 MG capsule Take 40 mg by mouth daily.   potassium chloride SA (KLOR-CON) 20 MEQ tablet TAKE ONE TABLET TWICE DAILY   rosuvastatin (CRESTOR) 5 MG tablet TAKE 1 TABLET BY MOUTH DAILY   sertraline (ZOLOFT) 50 MG tablet TAKE 1 TABLET BY MOUTH DAILY.   triamterene-hydrochlorothiazide (MAXZIDE-25) 37.5-25 MG tablet TAKE 1 TABLET BY MOUTH DAILY   [DISCONTINUED] ondansetron (ZOFRAN) 4 MG tablet Take 1 tablet (4 mg total) by mouth daily as needed for nausea or vomiting. (Patient not taking: Reported on  08/30/2021)   No facility-administered medications prior to visit.    Review of Systems per HPI      Objective    BP (!) 160/90 (BP Location: Left Arm, Patient Position: Sitting, Cuff Size: Normal)   Pulse 81   Temp 98.5 F (36.9 C) (Oral)   Wt 200 lb (90.7 kg)   SpO2 97%   BMI 33.28 kg/m   BP Readings from Last 3 Encounters:  08/30/21 (!) 160/90  06/20/21 139/90  05/28/21 113/76     Physical Exam Vitals reviewed.  Constitutional:      General: She is not in acute distress.    Appearance: Normal appearance. She is well-developed. She is not diaphoretic.  HENT:     Head: Normocephalic and atraumatic.  Eyes:     General: No scleral icterus.    Conjunctiva/sclera: Conjunctivae normal.  Neck:     Thyroid: No thyromegaly.  Cardiovascular:     Rate and Rhythm: Normal rate and regular rhythm.     Pulses: Normal pulses.     Heart sounds: Normal heart sounds. No murmur heard. Pulmonary:     Effort: Pulmonary effort is normal. No respiratory distress.     Breath sounds: Normal breath sounds. No wheezing, rhonchi or rales.  Musculoskeletal:     Cervical back: Neck supple.     Right lower leg: No edema.     Left lower leg: No edema.     Comments: L Wrist: Inspection normal with no visible erythema or swelling. ROM smooth and normal with good flexion and extension and ulnar/radial deviation that is symmetrical with opposite wrist. Strength 5/5 in all directions without pain. Positive Finkelstein  Lymphadenopathy:     Cervical: No cervical adenopathy.  Skin:    General: Skin is warm and dry.     Findings: No rash.  Neurological:     Mental Status: She is alert and oriented to person, place, and time. Mental status is at baseline.  Psychiatric:        Mood and Affect: Mood normal.        Behavior: Behavior normal.       No results found for any visits on 08/30/21.  Assessment & Plan     Problem List Items Addressed This Visit       Cardiovascular and  Mediastinum   Essential hypertension - Primary    Chronic and elevated, but did have high sodium meal last night and emotions are high Continue current meds Recheck in 1 month and if still elevated will increase meds      Relevant Orders   Comprehensive metabolic panel     Endocrine   Adult hypothyroidism    Previously well controlled Continue Synthroid at current dose  Recheck TSH and adjust Synthroid as indicated        Relevant Orders   TSH     Musculoskeletal and Integument   De Quervain's tenosynovitis, left    New problem Discussed rest (will be lifting grandchild significantly less), ice, bracing        Other   Hyperlipemia, mixed    Previously well controlled Continue statin Repeat FLP and CMP      Relevant Orders   Comprehensive metabolic panel   Lipid panel   Obesity    Discussed importance of healthy weight management Discussed diet and exercise       Hypokalemia   Relevant Orders   Comprehensive metabolic panel   Prediabetes    Recommend low carb diet Recheck A1c       Relevant Orders   Hemoglobin A1c   Anxiety    Well controlled Wants to consider Zoloft taper - discussed decreasing to '25mg'$  daily x1wk and then '25mg'$  qod x1wk and then stopping      Elevated LFTs    Related to recent choledocholithiasis s/p cholescystectomy Recheck today to see if normalized      Relevant Orders   Comprehensive metabolic panel     Return in about 4 weeks (around 09/27/2021) for BP f/u.      I, [name], MD, have reviewed all documentation for this visit. The documentation on 08/30/21 for the exam, diagnosis, procedures, and orders are all accurate and complete.   Minna Dumire, Dionne Bucy, MD, MPH [facility]

## 2021-08-30 ENCOUNTER — Encounter: Payer: Self-pay | Admitting: Family Medicine

## 2021-08-30 ENCOUNTER — Ambulatory Visit (INDEPENDENT_AMBULATORY_CARE_PROVIDER_SITE_OTHER): Payer: Medicare PPO | Admitting: Family Medicine

## 2021-08-30 VITALS — BP 150/100 | HR 81 | Temp 98.5°F | Wt 200.0 lb

## 2021-08-30 DIAGNOSIS — M62830 Muscle spasm of back: Secondary | ICD-10-CM | POA: Diagnosis not present

## 2021-08-30 DIAGNOSIS — E669 Obesity, unspecified: Secondary | ICD-10-CM | POA: Diagnosis not present

## 2021-08-30 DIAGNOSIS — E782 Mixed hyperlipidemia: Secondary | ICD-10-CM

## 2021-08-30 DIAGNOSIS — E039 Hypothyroidism, unspecified: Secondary | ICD-10-CM

## 2021-08-30 DIAGNOSIS — R7303 Prediabetes: Secondary | ICD-10-CM | POA: Diagnosis not present

## 2021-08-30 DIAGNOSIS — M5412 Radiculopathy, cervical region: Secondary | ICD-10-CM | POA: Diagnosis not present

## 2021-08-30 DIAGNOSIS — R7989 Other specified abnormal findings of blood chemistry: Secondary | ICD-10-CM | POA: Diagnosis not present

## 2021-08-30 DIAGNOSIS — M9901 Segmental and somatic dysfunction of cervical region: Secondary | ICD-10-CM | POA: Diagnosis not present

## 2021-08-30 DIAGNOSIS — M654 Radial styloid tenosynovitis [de Quervain]: Secondary | ICD-10-CM | POA: Diagnosis not present

## 2021-08-30 DIAGNOSIS — I10 Essential (primary) hypertension: Secondary | ICD-10-CM

## 2021-08-30 DIAGNOSIS — E876 Hypokalemia: Secondary | ICD-10-CM

## 2021-08-30 DIAGNOSIS — Z6833 Body mass index (BMI) 33.0-33.9, adult: Secondary | ICD-10-CM

## 2021-08-30 DIAGNOSIS — F419 Anxiety disorder, unspecified: Secondary | ICD-10-CM | POA: Diagnosis not present

## 2021-08-30 DIAGNOSIS — M25511 Pain in right shoulder: Secondary | ICD-10-CM | POA: Diagnosis not present

## 2021-08-30 NOTE — Assessment & Plan Note (Signed)
Well controlled Wants to consider Zoloft taper - discussed decreasing to '25mg'$  daily x1wk and then '25mg'$  qod x1wk and then stopping

## 2021-08-30 NOTE — Assessment & Plan Note (Signed)
Previously well controlled Continue Synthroid at current dose  Recheck TSH and adjust Synthroid as indicated   

## 2021-08-30 NOTE — Assessment & Plan Note (Signed)
Chronic and elevated, but did have high sodium meal last night and emotions are high Continue current meds Recheck in 1 month and if still elevated will increase meds

## 2021-08-30 NOTE — Assessment & Plan Note (Signed)
Related to recent choledocholithiasis s/p cholescystectomy Recheck today to see if normalized

## 2021-08-30 NOTE — Assessment & Plan Note (Signed)
Previously well controlled Continue statin Repeat FLP and CMP  

## 2021-08-30 NOTE — Assessment & Plan Note (Signed)
Discussed importance of healthy weight management Discussed diet and exercise  

## 2021-08-30 NOTE — Assessment & Plan Note (Signed)
Recommend low carb diet °Recheck A1c  °

## 2021-08-30 NOTE — Assessment & Plan Note (Signed)
New problem Discussed rest (will be lifting grandchild significantly less), ice, bracing

## 2021-08-31 LAB — COMPREHENSIVE METABOLIC PANEL
ALT: 16 IU/L (ref 0–32)
AST: 18 IU/L (ref 0–40)
Albumin/Globulin Ratio: 1.9 (ref 1.2–2.2)
Albumin: 4.6 g/dL (ref 3.8–4.8)
Alkaline Phosphatase: 94 IU/L (ref 44–121)
BUN/Creatinine Ratio: 23 (ref 12–28)
BUN: 22 mg/dL (ref 8–27)
Bilirubin Total: 0.4 mg/dL (ref 0.0–1.2)
CO2: 23 mmol/L (ref 20–29)
Calcium: 9.5 mg/dL (ref 8.7–10.3)
Chloride: 100 mmol/L (ref 96–106)
Creatinine, Ser: 0.96 mg/dL (ref 0.57–1.00)
Globulin, Total: 2.4 g/dL (ref 1.5–4.5)
Glucose: 113 mg/dL — ABNORMAL HIGH (ref 70–99)
Potassium: 3.9 mmol/L (ref 3.5–5.2)
Sodium: 139 mmol/L (ref 134–144)
Total Protein: 7 g/dL (ref 6.0–8.5)
eGFR: 66 mL/min/{1.73_m2} (ref >59)

## 2021-08-31 LAB — LIPID PANEL
Chol/HDL Ratio: 2.9 ratio (ref 0.0–4.4)
Cholesterol, Total: 167 mg/dL (ref 100–199)
HDL: 58 mg/dL (ref >39)
LDL Chol Calc (NIH): 91 mg/dL (ref 0–99)
Triglycerides: 99 mg/dL (ref 0–149)
VLDL Cholesterol Cal: 18 mg/dL (ref 5–40)

## 2021-08-31 LAB — HEMOGLOBIN A1C
Est. average glucose Bld gHb Est-mCnc: 131 mg/dL
Hgb A1c MFr Bld: 6.2 % — ABNORMAL HIGH (ref 4.8–5.6)

## 2021-08-31 LAB — TSH: TSH: 5.22 u[IU]/mL — ABNORMAL HIGH (ref 0.450–4.500)

## 2021-09-02 ENCOUNTER — Other Ambulatory Visit: Payer: Self-pay

## 2021-09-02 DIAGNOSIS — M9901 Segmental and somatic dysfunction of cervical region: Secondary | ICD-10-CM | POA: Diagnosis not present

## 2021-09-02 DIAGNOSIS — M5412 Radiculopathy, cervical region: Secondary | ICD-10-CM | POA: Diagnosis not present

## 2021-09-02 DIAGNOSIS — E039 Hypothyroidism, unspecified: Secondary | ICD-10-CM

## 2021-09-02 DIAGNOSIS — M62830 Muscle spasm of back: Secondary | ICD-10-CM | POA: Diagnosis not present

## 2021-09-02 DIAGNOSIS — M25511 Pain in right shoulder: Secondary | ICD-10-CM | POA: Diagnosis not present

## 2021-09-05 DIAGNOSIS — M25511 Pain in right shoulder: Secondary | ICD-10-CM | POA: Diagnosis not present

## 2021-09-05 DIAGNOSIS — M5412 Radiculopathy, cervical region: Secondary | ICD-10-CM | POA: Diagnosis not present

## 2021-09-05 DIAGNOSIS — M9901 Segmental and somatic dysfunction of cervical region: Secondary | ICD-10-CM | POA: Diagnosis not present

## 2021-09-05 DIAGNOSIS — M62830 Muscle spasm of back: Secondary | ICD-10-CM | POA: Diagnosis not present

## 2021-09-12 DIAGNOSIS — R399 Unspecified symptoms and signs involving the genitourinary system: Secondary | ICD-10-CM | POA: Diagnosis not present

## 2021-09-12 DIAGNOSIS — Z6833 Body mass index (BMI) 33.0-33.9, adult: Secondary | ICD-10-CM | POA: Diagnosis not present

## 2021-09-12 DIAGNOSIS — N3001 Acute cystitis with hematuria: Secondary | ICD-10-CM | POA: Diagnosis not present

## 2021-09-15 ENCOUNTER — Other Ambulatory Visit: Payer: Self-pay | Admitting: Family Medicine

## 2021-09-15 DIAGNOSIS — E039 Hypothyroidism, unspecified: Secondary | ICD-10-CM

## 2021-09-15 DIAGNOSIS — I10 Essential (primary) hypertension: Secondary | ICD-10-CM

## 2021-09-18 DIAGNOSIS — M5412 Radiculopathy, cervical region: Secondary | ICD-10-CM | POA: Diagnosis not present

## 2021-09-18 DIAGNOSIS — M25511 Pain in right shoulder: Secondary | ICD-10-CM | POA: Diagnosis not present

## 2021-09-18 DIAGNOSIS — M9901 Segmental and somatic dysfunction of cervical region: Secondary | ICD-10-CM | POA: Diagnosis not present

## 2021-09-18 DIAGNOSIS — M62830 Muscle spasm of back: Secondary | ICD-10-CM | POA: Diagnosis not present

## 2021-09-20 ENCOUNTER — Other Ambulatory Visit: Payer: Self-pay | Admitting: Family Medicine

## 2021-09-20 DIAGNOSIS — E876 Hypokalemia: Secondary | ICD-10-CM

## 2021-10-01 ENCOUNTER — Other Ambulatory Visit: Payer: Self-pay | Admitting: Family Medicine

## 2021-10-01 DIAGNOSIS — M25511 Pain in right shoulder: Secondary | ICD-10-CM | POA: Diagnosis not present

## 2021-10-01 DIAGNOSIS — M62830 Muscle spasm of back: Secondary | ICD-10-CM | POA: Diagnosis not present

## 2021-10-01 DIAGNOSIS — M5412 Radiculopathy, cervical region: Secondary | ICD-10-CM | POA: Diagnosis not present

## 2021-10-01 DIAGNOSIS — M9901 Segmental and somatic dysfunction of cervical region: Secondary | ICD-10-CM | POA: Diagnosis not present

## 2021-10-02 ENCOUNTER — Ambulatory Visit: Payer: Medicare PPO | Admitting: Dermatology

## 2021-10-02 DIAGNOSIS — D229 Melanocytic nevi, unspecified: Secondary | ICD-10-CM

## 2021-10-02 DIAGNOSIS — L821 Other seborrheic keratosis: Secondary | ICD-10-CM

## 2021-10-02 DIAGNOSIS — L570 Actinic keratosis: Secondary | ICD-10-CM

## 2021-10-02 DIAGNOSIS — D1800 Hemangioma unspecified site: Secondary | ICD-10-CM

## 2021-10-02 DIAGNOSIS — I8393 Asymptomatic varicose veins of bilateral lower extremities: Secondary | ICD-10-CM

## 2021-10-02 DIAGNOSIS — D2362 Other benign neoplasm of skin of left upper limb, including shoulder: Secondary | ICD-10-CM | POA: Diagnosis not present

## 2021-10-02 DIAGNOSIS — L814 Other melanin hyperpigmentation: Secondary | ICD-10-CM | POA: Diagnosis not present

## 2021-10-02 DIAGNOSIS — L578 Other skin changes due to chronic exposure to nonionizing radiation: Secondary | ICD-10-CM

## 2021-10-02 DIAGNOSIS — D2371 Other benign neoplasm of skin of right lower limb, including hip: Secondary | ICD-10-CM

## 2021-10-02 DIAGNOSIS — D239 Other benign neoplasm of skin, unspecified: Secondary | ICD-10-CM

## 2021-10-02 DIAGNOSIS — Z85820 Personal history of malignant melanoma of skin: Secondary | ICD-10-CM

## 2021-10-02 DIAGNOSIS — Z1283 Encounter for screening for malignant neoplasm of skin: Secondary | ICD-10-CM

## 2021-10-02 DIAGNOSIS — C439 Malignant melanoma of skin, unspecified: Secondary | ICD-10-CM

## 2021-10-02 NOTE — Progress Notes (Signed)
Follow-Up Visit   Subjective  Angela Villarreal is a 66 y.o. female who presents for the following: Annual Exam (History of Melanoma - The patient presents for Total-Body Skin Exam (TBSE) for skin cancer screening and mole check.  The patient has spots, moles and lesions to be evaluated, some may be new or changing and the patient has concerns that these could be cancer./).  The following portions of the chart were reviewed this encounter and updated as appropriate:   Tobacco  Allergies  Meds  Problems  Med Hx  Surg Hx  Fam Hx     Review of Systems:  No other skin or systemic complaints except as noted in HPI or Assessment and Plan.  Objective  Well appearing patient in no apparent distress; mood and affect are within normal limits.  A full examination was performed including scalp, head, eyes, ears, nose, lips, neck, chest, axillae, abdomen, back, buttocks, bilateral upper extremities, bilateral lower extremities, hands, feet, fingers, toes, fingernails, and toenails. All findings within normal limits unless otherwise noted below.  Right distal medial thigh, left forearm Firm pink/brown papulenodule with dimple sign.   Legs Varicies  Nose Erythematous thin papules/macules with gritty scale.    Assessment & Plan   History of Melanoma - No evidence of recurrence today - No lymphadenopathy - Recommend regular full body skin exams - Recommend daily broad spectrum sunscreen SPF 30+ to sun-exposed areas, reapply every 2 hours as needed.  - Call if any new or changing lesions are noted between office visits  Lentigines - Scattered tan macules - Due to sun exposure - Benign-appearing, observe - Recommend daily broad spectrum sunscreen SPF 30+ to sun-exposed areas, reapply every 2 hours as needed. - Call for any changes  Seborrheic Keratoses - Stuck-on, waxy, tan-brown papules and/or plaques  - Benign-appearing - Discussed benign etiology and prognosis. - Observe - Call  for any changes  Melanocytic Nevi - Tan-brown and/or pink-flesh-colored symmetric macules and papules - Benign appearing on exam today - Observation - Call clinic for new or changing moles - Recommend daily use of broad spectrum spf 30+ sunscreen to sun-exposed areas.   Hemangiomas - Red papules - Discussed benign nature - Observe - Call for any changes  Actinic Damage - Chronic condition, secondary to cumulative UV/sun exposure - diffuse scaly erythematous macules with underlying dyspigmentation - Recommend daily broad spectrum sunscreen SPF 30+ to sun-exposed areas, reapply every 2 hours as needed.  - Staying in the shade or wearing long sleeves, sun glasses (UVA+UVB protection) and wide brim hats (4-inch brim around the entire circumference of the hat) are also recommended for sun protection.  - Call for new or changing lesions.  Skin cancer screening performed today.  Dermatofibroma Right distal medial thigh, left forearm A dermatofibroma is a benign growth possibly related to trauma, such as an insect bite or inflamed acne-type bump.  Discussed removal (shave vrs excision) with resulting scar and risk of recurrence.  Since not bothersome, will observe for now.   Asymptomatic varicose veins of both lower extremities Legs Benign, observe.    AK (actinic keratosis) Nose Destruction of lesion - Nose Complexity: simple   Destruction method: cryotherapy   Informed consent: discussed and consent obtained   Timeout:  patient name, date of birth, surgical site, and procedure verified Lesion destroyed using liquid nitrogen: Yes   Region frozen until ice ball extended beyond lesion: Yes   Outcome: patient tolerated procedure well with no complications   Post-procedure details:  wound care instructions given    Return in about 1 year (around 10/03/2022) for TBSE.  I, Ashok Cordia, CMA, am acting as scribe for Sarina Ser, MD . Documentation: I have reviewed the above  documentation for accuracy and completeness, and I agree with the above.  Sarina Ser, MD

## 2021-10-02 NOTE — Patient Instructions (Addendum)
Cryotherapy Aftercare  Wash gently with soap and water everyday.   Apply Vaseline and Band-Aid daily until healed.    A dermatofibroma is a benign growth possibly related to trauma, such as an insect bite or inflamed acne-type bump.  Discussed removal (shave vrs excision) with resulting scar and risk of recurrence.  Since not bothersome, will observe for now.   Due to recent changes in healthcare laws, you may see results of your pathology and/or laboratory studies on MyChart before the doctors have had a chance to review them. We understand that in some cases there may be results that are confusing or concerning to you. Please understand that not all results are received at the same time and often the doctors may need to interpret multiple results in order to provide you with the best plan of care or course of treatment. Therefore, we ask that you please give [place] 2 business days to thoroughly review all your results before contacting the office for clarification. Should we see a critical lab result, you will be contacted sooner.   If You Need Anything After Your Visit  If you have any questions or concerns for your doctor, please call our main line at [phone] and press option 4 to reach your doctor's medical assistant. If no one answers, please leave a voicemail as directed and we will return your call as soon as possible. Messages left after 4 pm will be answered the following business day.   You may also send [place] a message via [place]. We typically respond to MyChart messages within 1-2 business days.  For prescription refills, please ask your pharmacy to contact our office. Our fax number is [phone].  If you have an urgent issue when the clinic is closed that cannot wait until the next business day, you can page your doctor at the number below.    Please note that while we do our best to be available for urgent issues outside of office hours, we are not available 24/7.   If you  have an urgent issue and are unable to reach [place], you may choose to seek medical care at your doctor's office, retail clinic, urgent care center, or emergency room.  If you have a medical emergency, please immediately call 911 or go to the emergency department.  Pager Numbers  - Dr. Nehemiah Massed: [phone]  - Dr. Laurence Ferrari: [phone]  - Dr. Nicole Kindred: [phone]  In the event of inclement weather, please call our main line at [phone] for an update on the status of any delays or closures.  Dermatology Medication Tips: Please keep the boxes that topical medications come in in order to help keep track of the instructions about where and how to use these. Pharmacies typically print the medication instructions only on the boxes and not directly on the medication tubes.   If your medication is too expensive, please contact our office at [phone] option 4 or send [place] a message through [place].   We are unable to tell what your co-pay for medications will be in advance as this is different depending on your insurance coverage. However, we may be able to find a substitute medication at lower cost or fill out paperwork to get insurance to cover a needed medication.   If a prior authorization is required to get your medication covered by your insurance company, please allow [place] 1-2 business days to complete this process.  Drug prices often vary depending on where the prescription is filled and some pharmacies may  offer cheaper prices.  The website www.goodrx.com contains coupons for medications through different pharmacies. The prices here do not account for what the cost may be with help from insurance (it may be cheaper with your insurance), but the website can give you the price if you did not use any insurance.  - You can print the associated coupon and take it with your prescription to the pharmacy.  - You may also stop by our office during regular business hours and pick up a GoodRx coupon  card.  - If you need your prescription sent electronically to a different pharmacy, notify our office through [facility] or by phone at [phone] option 4.     Si Usted Necesita Algo Despus de Su Visita  Tambin puede enviarnos un mensaje a travs de Pharmacist, community. Por lo general respondemos a los mensajes de MyChart en el transcurso de 1 a 2 das hbiles.  Para renovar recetas, por favor pida a su farmacia que se ponga en contacto con nuestra oficina. Harland Dingwall de fax es [place] [phone].  Si tiene un asunto urgente cuando la clnica est cerrada y que no puede esperar hasta el siguiente da hbil, puede llamar/localizar a su doctor(a) al nmero que aparece a continuacin.   Por favor, tenga en cuenta que aunque hacemos todo lo posible para estar disponibles para asuntos urgentes fuera del horario de [place], no estamos disponibles las 24 horas del da, los 7 das de la [place].   Si tiene un problema urgente y no puede comunicarse con nosotros, puede optar por buscar atencin mdica  en el consultorio de su doctor(a), en una clnica privada, en un centro de atencin urgente o en una sala de emergencias.  Si tiene Engineering geologist, por favor llame inmediatamente al 911 o vaya a la sala de emergencias.  Nmeros de bper  - Dr. Nehemiah Massed: [phone]  - Dra. Moye: [phone]  - Dra. Nicole Kindred: [phone]  En caso de inclemencias del [place], por favor llame a Johnsie Kindred principal al [phone] para una actualizacin sobre el [place] de cualquier retraso o cierre.  Consejos para la medicacin en dermatologa: Por favor, guarde las cajas en las que vienen los medicamentos de uso tpico para ayudarle a seguir las instrucciones sobre dnde y cmo usarlos. Las farmacias generalmente imprimen las instrucciones del medicamento slo en las cajas y no directamente en los tubos del [place].   Si su medicamento es muy caro, por favor, pngase en contacto con Zigmund Daniel llamando al [phone] y presione la opcin 4 o envenos un mensaje a travs de Pharmacist, community.   No podemos decirle cul ser su copago por los medicamentos por adelantado ya que esto es diferente dependiendo de la cobertura de su seguro. Sin embargo, es posible que podamos encontrar un medicamento sustituto a Electrical engineer un formulario para que el seguro cubra el medicamento que se considera necesario.   Si se requiere una autorizacin previa para que su compaa de seguros [place] su medicamento, por favor permtanos de 1 a 2 das hbiles para completar este proceso.  Los precios de los medicamentos varan con frecuencia dependiendo del Environmental consultant de dnde se surte la receta y alguna farmacias pueden ofrecer precios ms baratos.  El sitio web www.goodrx.com tiene cupones para medicamentos de Airline pilot. Los precios aqu no tienen en cuenta lo que podra costar con la ayuda del seguro (puede ser ms barato con su seguro), pero el sitio web puede darle el precio si no Copy  ningn seguro.  - Puede imprimir el cupn correspondiente y llevarlo con su receta a la farmacia.  - Tambin puede pasar por nuestra oficina durante el horario de atencin regular y Charity fundraiser una tarjeta de cupones de GoodRx.  - Si necesita que su receta se enve electrnicamente a una farmacia diferente, informe a nuestra oficina a travs de MyChart de [place] o por telfono llamando al [phone] y presione la opcin 4.

## 2021-10-07 ENCOUNTER — Encounter: Payer: Self-pay | Admitting: Family Medicine

## 2021-10-07 ENCOUNTER — Ambulatory Visit (INDEPENDENT_AMBULATORY_CARE_PROVIDER_SITE_OTHER): Payer: Medicare PPO | Admitting: Family Medicine

## 2021-10-07 VITALS — BP 120/73 | HR 80 | Ht 65.0 in | Wt 203.9 lb

## 2021-10-07 DIAGNOSIS — I10 Essential (primary) hypertension: Secondary | ICD-10-CM

## 2021-10-07 DIAGNOSIS — F419 Anxiety disorder, unspecified: Secondary | ICD-10-CM

## 2021-10-07 NOTE — Assessment & Plan Note (Signed)
Previously discussed possible zoloft taper per patient preference, has not done yet. Continue to monitor.

## 2021-10-07 NOTE — Assessment & Plan Note (Signed)
Well controlled on today's reading. Compared office cuff (large) with home cuff (regular) with home cuff systolic reading 20 points higher. Patient will get large cuff for home. No changes to medications today. F/u in 1 month with new readings, adjust medication as needed. Obtain labs at that time.

## 2021-10-07 NOTE — Patient Instructions (Signed)
It was great to see you!  Our plans for today:  - No changes to your medications.  - Come back next month for recheck.   Take care and seek immediate care sooner if you develop any concerns.   Dr. Ky Barban

## 2021-10-07 NOTE — Progress Notes (Signed)
   SUBJECTIVE:   CHIEF COMPLAINT / HPI:   Hypertension: - Medications: diltiazem, maxzide - Compliance: yes - Checking BP at home: yes (129/84 , 171/98) - Denies any SOB, CP, vision changes, LE edema, medication SEs, or symptoms of hypotension - Diet: regular  - Exercise: no  Anxiety - previously discussed possible zoloft taper per patient preference, has not done yet.    OBJECTIVE:   BP 120/73 (BP Location: Left Arm, Patient Position: Sitting, Cuff Size: Large)   Pulse 80   Ht '5\' 5"'$  (1.651 m)   Wt 203 lb 14.4 oz (92.5 kg)   SpO2 99%   BMI 33.93 kg/m   Gen: well appearing, in NAD Card: Reg rate Lungs: Comfortable WOB on RA Ext: WWP, no edema   ASSESSMENT/PLAN:   Essential hypertension Well controlled on today's reading. Compared office cuff (large) with home cuff (regular) with home cuff systolic reading 20 points higher. Patient will get large cuff for home. No changes to medications today. F/u in 1 month with new readings, adjust medication as needed. Obtain labs at that time.   Anxiety Previously discussed possible zoloft taper per patient preference, has not done yet. Continue to monitor.     Myles Gip, DO

## 2021-10-11 ENCOUNTER — Encounter: Payer: Self-pay | Admitting: Dermatology

## 2021-10-16 DIAGNOSIS — M5412 Radiculopathy, cervical region: Secondary | ICD-10-CM | POA: Diagnosis not present

## 2021-10-16 DIAGNOSIS — M62830 Muscle spasm of back: Secondary | ICD-10-CM | POA: Diagnosis not present

## 2021-10-16 DIAGNOSIS — M9901 Segmental and somatic dysfunction of cervical region: Secondary | ICD-10-CM | POA: Diagnosis not present

## 2021-10-16 DIAGNOSIS — M25511 Pain in right shoulder: Secondary | ICD-10-CM | POA: Diagnosis not present

## 2021-10-30 DIAGNOSIS — M5412 Radiculopathy, cervical region: Secondary | ICD-10-CM | POA: Diagnosis not present

## 2021-10-30 DIAGNOSIS — M25511 Pain in right shoulder: Secondary | ICD-10-CM | POA: Diagnosis not present

## 2021-10-30 DIAGNOSIS — M9901 Segmental and somatic dysfunction of cervical region: Secondary | ICD-10-CM | POA: Diagnosis not present

## 2021-10-30 DIAGNOSIS — M62830 Muscle spasm of back: Secondary | ICD-10-CM | POA: Diagnosis not present

## 2021-10-31 DIAGNOSIS — E039 Hypothyroidism, unspecified: Secondary | ICD-10-CM | POA: Diagnosis not present

## 2021-11-01 LAB — TSH+FREE T4
Free T4: 1.13 ng/dL (ref 0.82–1.77)
TSH: 12.9 u[IU]/mL — ABNORMAL HIGH (ref 0.450–4.500)

## 2021-11-06 ENCOUNTER — Telehealth: Payer: Self-pay

## 2021-11-06 ENCOUNTER — Ambulatory Visit: Payer: Medicare PPO | Admitting: Family Medicine

## 2021-11-06 ENCOUNTER — Other Ambulatory Visit: Payer: Self-pay | Admitting: Physician Assistant

## 2021-11-06 DIAGNOSIS — E039 Hypothyroidism, unspecified: Secondary | ICD-10-CM

## 2021-11-06 MED ORDER — LEVOTHYROXINE SODIUM 25 MCG PO TABS: 25.0000 ug | ORAL_TABLET | Freq: Every day | ORAL | 0 refills | Status: DC

## 2021-11-06 NOTE — Addendum Note (Signed)
Addended by: Barnie Mort on: 11/06/2021 08:30 AM   Modules accepted: Orders

## 2021-11-06 NOTE — Telephone Encounter (Signed)
Patient had abnormal TSH last week and was waiting until today when she was to see Dr Ky Barban to discuss the medication change.  Since she won't be seen until next week she would like to go ahead and get on the new dose now because she is having symptoms.  She had just gotten a 90 day supply last month and still has 2 months of 100 mcg.  She asked if there was any way to adjust it so she can utilize those instead of wasting them she would appreciate it.  She is agreeable to taking 2 pills for a couple months if needed ( 100 + 25 ??) She uses [facility].

## 2021-11-11 ENCOUNTER — Ambulatory Visit (INDEPENDENT_AMBULATORY_CARE_PROVIDER_SITE_OTHER): Payer: Medicare PPO | Admitting: Family Medicine

## 2021-11-11 ENCOUNTER — Encounter: Payer: Self-pay | Admitting: Family Medicine

## 2021-11-11 VITALS — BP 136/89 | HR 81 | Temp 98.6°F | Resp 16 | Ht 65.0 in | Wt 202.2 lb

## 2021-11-11 DIAGNOSIS — E039 Hypothyroidism, unspecified: Secondary | ICD-10-CM | POA: Diagnosis not present

## 2021-11-11 DIAGNOSIS — I10 Essential (primary) hypertension: Secondary | ICD-10-CM

## 2021-11-11 DIAGNOSIS — M1990 Unspecified osteoarthritis, unspecified site: Secondary | ICD-10-CM

## 2021-11-11 DIAGNOSIS — F419 Anxiety disorder, unspecified: Secondary | ICD-10-CM | POA: Diagnosis not present

## 2021-11-11 NOTE — Patient Instructions (Signed)
It was great to see you!  Our plans for today:  - Continue on your zoloft taper, decrease to '25mg'$  every other day for a couple weeks then stop. - continue taking 123mg of Synthroid. - continue checking your blood pressure, when you are calm, not in pain and have been sitting for at least 5 minutes.  - Follow up in 6 weeks.  Take care and seek immediate care sooner if you develop any concerns.   Dr. RKy Barban

## 2021-11-11 NOTE — Progress Notes (Unsigned)
   SUBJECTIVE:   CHIEF COMPLAINT / HPI:   Hypertension: - Medications: diltiazem, maxzide - Compliance: good - Checking BP at home: yes, usually 140s. Highest 160s.  - Denies any SOB, CP, vision changes, LE edema, medication SEs, or symptoms of hypotension  Hypothyroidism - Medications: Synthroid 147mg - Current symptoms:  change in energy level - Denies diarrhea - Symptoms have gradually worsened  Anxiety - currently on zoloft taper. Currently on '25mg'$  daily. Feels she is doing ok, has noticed slightly decreased patience.   Athritis - hands. Voltaren helps. Compression helps. Loves doing needle point but has pain afterwards. Interested in [employer]   OBJECTIVE:   BP 136/89 (BP Location: Left Arm, Patient Position: Sitting, Cuff Size: Large)   Pulse 81   Temp 98.6 F (37 C) (Oral)   Resp 16   Ht '5\' 5"'$  (1.651 m)   Wt 202 lb 3.2 oz (91.7 kg)   BMI 33.65 kg/m   Gen: well appearing, in NAD Card: RRR Lungs: CTAB Ext: WWP, no edema   ASSESSMENT/PLAN:   Essential hypertension Slightly elevated today, improved on recheck. Concerned about thyroid abnormalities contributing to increased BP, plan to recheck labs prior med adjustment. Continue to monitor at home. F/u in 6 wks.   Arthritis, degenerative Recommend continued voltaren prn. Ok to take oral NSAID prior to prolonged use of hands.  Adult hypothyroidism Recently adjusted synthroid dose. Plan to recheck labs in 6 weeks.   Anxiety Overall doing well on taper with minimal symptoms. Continue taper as previously discussed with low threshold to restart if redevelops symptoms.     AMyles Gip DO

## 2021-11-12 DIAGNOSIS — M5412 Radiculopathy, cervical region: Secondary | ICD-10-CM | POA: Diagnosis not present

## 2021-11-12 DIAGNOSIS — M62830 Muscle spasm of back: Secondary | ICD-10-CM | POA: Diagnosis not present

## 2021-11-12 DIAGNOSIS — M9901 Segmental and somatic dysfunction of cervical region: Secondary | ICD-10-CM | POA: Diagnosis not present

## 2021-11-12 DIAGNOSIS — M25511 Pain in right shoulder: Secondary | ICD-10-CM | POA: Diagnosis not present

## 2021-11-12 NOTE — Assessment & Plan Note (Signed)
Slightly elevated today, improved on recheck. Concerned about thyroid abnormalities contributing to increased BP, plan to recheck labs prior med adjustment. Continue to monitor at home. F/u in 6 wks.

## 2021-11-12 NOTE — Assessment & Plan Note (Signed)
Recommend continued voltaren prn. Ok to take oral NSAID prior to prolonged use of hands.

## 2021-11-12 NOTE — Assessment & Plan Note (Signed)
Overall doing well on taper with minimal symptoms. Continue taper as previously discussed with low threshold to restart if redevelops symptoms.

## 2021-11-12 NOTE — Assessment & Plan Note (Signed)
Recently adjusted synthroid dose. Plan to recheck labs in 6 weeks.

## 2021-11-13 ENCOUNTER — Other Ambulatory Visit: Payer: Self-pay | Admitting: Family Medicine

## 2021-11-24 ENCOUNTER — Emergency Department: Payer: Medicare PPO

## 2021-11-24 ENCOUNTER — Emergency Department
Admission: EM | Admit: 2021-11-24 | Discharge: 2021-11-24 | Disposition: A | Discharge status: Home / Self Care | Payer: Medicare PPO | Attending: Emergency Medicine | Admitting: Emergency Medicine

## 2021-11-24 ENCOUNTER — Other Ambulatory Visit: Payer: Self-pay

## 2021-11-24 DIAGNOSIS — J45909 Unspecified asthma, uncomplicated: Secondary | ICD-10-CM | POA: Diagnosis not present

## 2021-11-24 DIAGNOSIS — R55 Syncope and collapse: Secondary | ICD-10-CM | POA: Diagnosis not present

## 2021-11-24 DIAGNOSIS — E039 Hypothyroidism, unspecified: Secondary | ICD-10-CM | POA: Diagnosis not present

## 2021-11-24 DIAGNOSIS — I10 Essential (primary) hypertension: Secondary | ICD-10-CM | POA: Diagnosis not present

## 2021-11-24 DIAGNOSIS — R531 Weakness: Secondary | ICD-10-CM | POA: Diagnosis not present

## 2021-11-24 LAB — BASIC METABOLIC PANEL
Anion gap: 10 (ref 5–15)
BUN: 19 mg/dL (ref 8–23)
CO2: 24 mmol/L (ref 22–32)
Calcium: 8.6 mg/dL — ABNORMAL LOW (ref 8.9–10.3)
Chloride: 105 mmol/L (ref 98–111)
Creatinine, Ser: 1.01 mg/dL — ABNORMAL HIGH (ref 0.44–1.00)
GFR, Estimated: >60 mL/min (ref >60)
Glucose, Bld: 159 mg/dL — ABNORMAL HIGH (ref 70–99)
Potassium: 3.2 mmol/L — ABNORMAL LOW (ref 3.5–5.1)
Sodium: 139 mmol/L (ref 135–145)

## 2021-11-24 LAB — CBC
HCT: 35.7 % — ABNORMAL LOW (ref 36.0–46.0)
Hemoglobin: 11.3 g/dL — ABNORMAL LOW (ref 12.0–15.0)
MCH: 27.6 pg (ref 26.0–34.0)
MCHC: 31.7 g/dL (ref 30.0–36.0)
MCV: 87.1 fL (ref 80.0–100.0)
Platelets: 191 10*3/uL (ref 150–400)
RBC: 4.1 MIL/uL (ref 3.87–5.11)
RDW: 13.2 % (ref 11.5–15.5)
WBC: 6.8 10*3/uL (ref 4.0–10.5)
nRBC: 0 % (ref 0.0–0.2)

## 2021-11-24 LAB — CBG MONITORING, ED: Glucose-Capillary: 147 mg/dL — ABNORMAL HIGH (ref 70–99)

## 2021-11-24 MED ORDER — ONDANSETRON HCL 4 MG/2ML IJ SOLN
INTRAMUSCULAR | Status: AC
  Filled 2021-11-24: qty 2

## 2021-11-24 NOTE — ED Triage Notes (Addendum)
Pt presents to ED with c/o of syncope after giving blood PTA. Pt states this was witnessed. Pt states she has given blood before but it has been several years. Pt has c/o of nausea. Pt denies hitting her head or any blood thinner use.   Pt states recent change in synthroid medication  EMS gave 500 ml of fluid and 4 mg of Zofran.  [place]   Pt denies any recent sickness.    Pt is having some difficulty finding her words.

## 2021-11-24 NOTE — ED Provider Notes (Signed)
[facility] Provider Note    Event Date/Time   First MD Initiated Contact with Patient 11/24/21 1523     (approximate)   History   Loss of Consciousness   HPI  Angela Villarreal is a 66 y.o. female with history of hypertension, asthma, hypothyroidism, and melanoma who presents with a syncopal episode shortly after giving blood today.  The patient states that she last gave blood about 10 years ago.  She gave a blood donation and then went across the street to eat.  She was putting a cracker in her mouth when she started to feel lightheaded, announce that she was feeling dizzy, and then syncopized, slumping over the table.  She was let down onto the ground by bystanders and did not hit her head.  Per her husband, she awoke after approximately 45 seconds.  The patient states that for some time after she continued to feel lightheaded and nauseous and felt like she could not get her words out.  At this time she endorses mild nausea but states she is otherwise feeling significantly better.  She denies any chest pain, difficulty breathing, fever, headache, or other acute symptoms.    Physical Exam   Triage Vital Signs: ED Triage Vitals  Enc Vitals Group     BP 11/24/21 1458 (!) 139/90     Pulse Rate 11/24/21 1458 70     Resp 11/24/21 1458 17     Temp 11/24/21 1501 97.9 F (36.6 C)     Temp Source 11/24/21 1501 Oral     SpO2 11/24/21 1458 100 %     Weight --      Height --      Head Circumference --      Peak Flow --      Pain Score --      Pain Loc --      Pain Edu? --      Excl. in [place]? --     Most recent vital signs: Vitals:   11/24/21 1458 11/24/21 1501  BP: (!) 139/90   Pulse: 70   Resp: 17   Temp:  97.9 F (36.6 C)  SpO2: 100%      General: Alert and oriented, well-appearing. CV:  Good peripheral perfusion.  Normal heart sounds. Resp:  Normal effort.  Lungs CTAB. Abd:  Soft and nontender.  No distention.  Other:  EOMI.  PERRLA.  Cranial  nerves III through XII grossly intact.  Motor and sensory intact in all extremities.  Normal coordination with no ataxia on finger-to-nose.  No pronator drift.   ED Results / Procedures / Treatments   Labs (all labs ordered are listed, but only abnormal results are displayed) Labs Reviewed  BASIC METABOLIC PANEL - Abnormal; Notable for the following components:      Result Value   Potassium 3.2 (*)    Glucose, Bld 159 (*)    Creatinine, Ser 1.01 (*)    Calcium 8.6 (*)    All other components within normal limits  CBC - Abnormal; Notable for the following components:   Hemoglobin 11.3 (*)    HCT 35.7 (*)    All other components within normal limits  CBG MONITORING, ED - Abnormal; Notable for the following components:   Glucose-Capillary 147 (*)    All other components within normal limits  URINALYSIS, ROUTINE W REFLEX MICROSCOPIC     EKG  ED ECG REPORT I, Arta Silence, the attending physician, personally viewed and interpreted this  ECG.  Date: 11/24/2021 EKG Time: 1501 Rate: 66 Rhythm: normal sinus rhythm QRS Axis: normal Intervals: normal ST/T Wave abnormalities: Nonspecific ST abnormalities Narrative Interpretation: no evidence of acute ischemia; no significant change when compared to EKG of 06/17/2021    RADIOLOGY  CT head: I independently viewed and interpreted the images; there is no ICH.  Radiology report indicates no acute abnormality.  PROCEDURES:  Critical Care performed: No  Procedures   MEDICATIONS ORDERED IN ED: Medications  ondansetron (ZOFRAN) 4 MG/2ML injection (has no administration in time range)     IMPRESSION / MDM / ASSESSMENT AND PLAN / ED COURSE  I reviewed the triage vital signs and the nursing notes.  66 year old female with PMH as noted above presents with a syncopal episode less than 10 minutes after giving blood.  She had a prodrome of lightheadedness.  She is now feeling significantly better.  I reviewed the past medical  records.  The patient was most recently admitted in April.  Per the hospitalist discharge summary from 4/6 she presented with abdominal pain and was found to have cholecystitis.  She is status post laparoscopic cholecystectomy on 4/5.  On exam the patient is well-appearing.  Her vital signs are normal.  Physical exam is unremarkable.  Neurologic exam is nonfocal.  Differential diagnosis includes, but is not limited to, vasovagal syncope, dehydration/hypovolemia, anemia.  The EKG shows no acute findings and there is no evidence of cardiac cause.  I also do not suspect CNS etiology.  The patient had no head trauma.  Patient's presentation is most consistent with acute presentation with potential threat to life or bodily function.  Basic labs are unremarkable with normal electrolytes except for borderline low potassium and calcium.  Hemoglobin is 11.3.  Glucose is normal.  CT head was obtained from triage and shows no acute findings.  There are no indications for cardiac enzymes.  The patient is on the cardiac monitor to evaluate for evidence of arrhythmia and/or significant heart rate changes.  Some fluids were given by EMS.  We will give an additional fluid bolus, nausea medicine, and reassess.  ----------------------------------------- 5:53 PM on 11/24/2021 -----------------------------------------  The patient is feeling significantly better and appears well.  She is stable for discharge home at this time.  I counseled her on the results of the work-up.  Return precautions given, and she expresses understanding.   FINAL CLINICAL IMPRESSION(S) / ED DIAGNOSES   Final diagnoses:  Vasovagal syncope     Rx / DC Orders   ED Discharge Orders     None        Note:  This document was prepared using Dragon voice recognition software and may include unintentional dictation errors.    Arta Silence, MD 11/24/21 1753

## 2021-11-24 NOTE — ED Notes (Signed)
D/C and reasons to return to ED discussed with pt, pt verbalized understanding. Pt ambulatory with steady gait on D/C. Husband with pt.

## 2021-11-24 NOTE — Discharge Instructions (Signed)
Drink plenty of fluids.  Return to the ER for new, worsening, or recurrent lightheadedness, passing out, or any other new or worsening symptoms that concern you.

## 2021-11-28 DIAGNOSIS — M9901 Segmental and somatic dysfunction of cervical region: Secondary | ICD-10-CM | POA: Diagnosis not present

## 2021-11-28 DIAGNOSIS — M62830 Muscle spasm of back: Secondary | ICD-10-CM | POA: Diagnosis not present

## 2021-11-28 DIAGNOSIS — M5412 Radiculopathy, cervical region: Secondary | ICD-10-CM | POA: Diagnosis not present

## 2021-11-28 DIAGNOSIS — M25511 Pain in right shoulder: Secondary | ICD-10-CM | POA: Diagnosis not present

## 2021-12-02 DIAGNOSIS — K219 Gastro-esophageal reflux disease without esophagitis: Secondary | ICD-10-CM | POA: Diagnosis not present

## 2021-12-10 ENCOUNTER — Ambulatory Visit (INDEPENDENT_AMBULATORY_CARE_PROVIDER_SITE_OTHER): Payer: Medicare PPO | Admitting: Family Medicine

## 2021-12-10 ENCOUNTER — Encounter: Payer: Self-pay | Admitting: Family Medicine

## 2021-12-10 VITALS — BP 118/76 | HR 91 | Temp 98.6°F | Resp 16 | Wt 206.7 lb

## 2021-12-10 DIAGNOSIS — L03213 Periorbital cellulitis: Secondary | ICD-10-CM

## 2021-12-10 MED ORDER — DOXYCYCLINE HYCLATE 100 MG PO TABS: 100.0000 mg | ORAL_TABLET | Freq: Two times a day (BID) | ORAL | 0 refills | Status: AC

## 2021-12-10 NOTE — Progress Notes (Signed)
   SUBJECTIVE:   CHIEF COMPLAINT / HPI:   EYE COMPLAINT - L upper eyelid Duration:  1 week Involved eye:  left Quality: sore Foreign body sensation:no Visual impairment: no Eye redness: yes Discharge: yes Crusting or matting of eyelids: yes Swelling: yes Photophobia: no Itching: yes Headache:  minimal Floaters: no URI symptoms: no Contact lens use: yes Close contacts with similar problems: no Eye trauma: no Aggravating factors: none Alleviating factors: warm compress, helped for a few days, no change over the last few days Status: stable Treatments attempted: pataday drops, saline, hot compress   OBJECTIVE:   BP 118/76 (BP Location: Left Arm, Patient Position: Sitting, Cuff Size: Large)   Pulse 91   Temp 98.6 F (37 C) (Oral)   Resp 16   Wt 206 lb 11.2 oz (93.8 kg)   BMI 34.40 kg/m   Gen: well appearing, in NAD HEENT: PERRL, EOMI. Erythema and swelling to L upper outer eyelid. Conjunctiva within normal limits, not injected. No crusting/discharge apparent.   ASSESSMENT/PLAN:   Preorbital cellulitis No orbital involvement. Rx doxycycline given allergy to amoxicillin. Continue warm compresses.    Myles Gip, DO

## 2021-12-16 DIAGNOSIS — M9901 Segmental and somatic dysfunction of cervical region: Secondary | ICD-10-CM | POA: Diagnosis not present

## 2021-12-16 DIAGNOSIS — M5412 Radiculopathy, cervical region: Secondary | ICD-10-CM | POA: Diagnosis not present

## 2021-12-16 DIAGNOSIS — M25511 Pain in right shoulder: Secondary | ICD-10-CM | POA: Diagnosis not present

## 2021-12-16 DIAGNOSIS — M62830 Muscle spasm of back: Secondary | ICD-10-CM | POA: Diagnosis not present

## 2021-12-25 DIAGNOSIS — E039 Hypothyroidism, unspecified: Secondary | ICD-10-CM | POA: Diagnosis not present

## 2021-12-26 LAB — TSH+FREE T4
Free T4: 1.66 ng/dL (ref 0.82–1.77)
TSH: 1.35 u[IU]/mL (ref 0.450–4.500)

## 2021-12-27 ENCOUNTER — Encounter: Payer: Self-pay | Admitting: Family Medicine

## 2021-12-27 ENCOUNTER — Ambulatory Visit (INDEPENDENT_AMBULATORY_CARE_PROVIDER_SITE_OTHER): Payer: Medicare PPO | Admitting: Family Medicine

## 2021-12-27 VITALS — BP 138/89 | HR 84 | Resp 16 | Wt 206.0 lb

## 2021-12-27 DIAGNOSIS — E039 Hypothyroidism, unspecified: Secondary | ICD-10-CM

## 2021-12-27 DIAGNOSIS — F419 Anxiety disorder, unspecified: Secondary | ICD-10-CM

## 2021-12-27 DIAGNOSIS — Z23 Encounter for immunization: Secondary | ICD-10-CM | POA: Diagnosis not present

## 2021-12-27 DIAGNOSIS — Z6834 Body mass index (BMI) 34.0-34.9, adult: Secondary | ICD-10-CM | POA: Diagnosis not present

## 2021-12-27 DIAGNOSIS — R7303 Prediabetes: Secondary | ICD-10-CM | POA: Diagnosis not present

## 2021-12-27 DIAGNOSIS — I10 Essential (primary) hypertension: Secondary | ICD-10-CM | POA: Diagnosis not present

## 2021-12-27 DIAGNOSIS — E669 Obesity, unspecified: Secondary | ICD-10-CM

## 2021-12-27 MED ORDER — LEVOTHYROXINE SODIUM 125 MCG PO TABS
125.0000 ug | ORAL_TABLET | Freq: Every day | ORAL | 1 refills | Status: DC
Start: 2021-12-27 — End: 2022-06-30

## 2021-12-27 NOTE — Assessment & Plan Note (Signed)
Well controlled on new dose of Synthroid - reviewed recent TSH Will recheck at next visit

## 2021-12-27 NOTE — Progress Notes (Signed)
I,Angela Villarreal,acting as a scribe for Angela Paganini, MD.,have documented all relevant documentation on the behalf of Angela Paganini, MD,as directed by  Angela Paganini, MD while in the presence of Angela Paganini, MD.   Established patient visit   Patient: Angela Villarreal   DOB: 1955-11-04   66 y.o. Female  MRN: [id number] Visit Date: 12/27/2021  Today's healthcare provider: [name], MD   Chief Complaint  Patient presents with   Follow-up   Hypertension   Hypothyroidism   Anxiety   Subjective    HPI  Hypertension, follow-up  BP Readings from Last 3 Encounters:  12/27/21 138/89  12/10/21 118/76  11/24/21 (!) 132/90   Wt Readings from Last 3 Encounters:  12/27/21 206 lb (93.4 kg)  12/10/21 206 lb 11.2 oz (93.8 kg)  11/11/21 202 lb 3.2 oz (91.7 kg)     She was last seen for hypertension 6 weeks ago.  Management since that visit includes; taking diltiazem, maxzide.  Outside blood pressures are checks occassionally.  Pertinent labs Lab Results  Component Value Date   CHOL 167 08/30/2021   HDL 58 08/30/2021   LDLCALC 91 08/30/2021   TRIG 99 08/30/2021   CHOLHDL 2.9 08/30/2021   Lab Results  Component Value Date   NA 139 11/24/2021   K 3.2 (L) 11/24/2021   CREATININE 1.01 (H) 11/24/2021   GFRNONAA >60 11/24/2021   GLUCOSE 159 (H) 11/24/2021   TSH 1.350 12/25/2021     The 10-year ASCVD risk score (Arnett DK, et al., 2019) is: 7.7%  ---------------------------------------------------------------------------------------------------  Hypothyroid, follow-up  Lab Results  Component Value Date   TSH 1.350 12/25/2021   TSH 12.900 (H) 10/31/2021   TSH 5.220 (H) 08/30/2021   FREET4 1.66 12/25/2021   FREET4 1.13 10/31/2021   T4TOTAL 8.4 09/04/2016    Wt Readings from Last 3 Encounters:  12/27/21 206 lb (93.4 kg)  12/10/21 206 lb 11.2 oz (93.8 kg)  11/11/21 202 lb 3.2 oz (91.7 kg)    She was last seen for hypothyroid 6 weeks ago.   Management since that visit includes; labs checked showing stable.  ----------------------------------------------------------------------------------------- Anxiety, Follow-up  She was last seen for anxiety 6 weeks ago. Changes made at last visit include; currently on zoloft taper. Currently on '25mg'$  daily. Feels she is doing ok.  GAD-7 Results    08/30/2021    9:47 AM 02/15/2021    9:59 AM 08/09/2020    3:33 PM  GAD-7 Generalized Anxiety Disorder Screening Tool  1. Feeling Nervous, Anxious, or on Edge 0 1 0  2. Not Being Able to Stop or Control Worrying 0 0 0  3. Worrying Too Much About Different Things 0 0 0  4. Trouble Relaxing 0 0 0  5. Being So Restless it's Hard To Sit Still 0 0 0  6. Becoming Easily Annoyed or Irritable 0 0 0  7. Feeling Afraid As If Something Awful Might Happen 0 0 0  Total GAD-7 Score 0 1 0  Difficulty At Work, Home, or Getting  Along With Others? Not difficult at all Not difficult at all Not difficult at all    PHQ-9 Scores    12/27/2021   10:50 AM 12/10/2021    1:20 PM 11/11/2021   11:05 AM  PHQ9 SCORE ONLY  PHQ-9 Total Score 0 0 6    ---------------------------------------------------------------------------------------------------   Medications: Outpatient Medications Prior to Visit  Medication Sig   albuterol (VENTOLIN HFA) 108 (90 Base) MCG/ACT inhaler Inhale 1-2 puffs  into the lungs every 4 (four) hours as needed for wheezing or shortness of breath.   ALLERGY RELIEF 10 MG tablet TAKE 1 TABLET BY MOUTH DAILY.   diltiazem (CARDIZEM CD) 120 MG 24 hr capsule TAKE 1 CAPSULE (120 MG) BY MOUTH EVERY DAY   fluticasone (FLONASE) 50 MCG/ACT nasal spray 2 SPRAYS IN EACH NOSTRIL EVERY DAY   fluticasone-salmeterol (ADVAIR HFA) 115-21 MCG/ACT inhaler Inhale 2 puffs into the lungs 2 (two) times daily.   hyoscyamine (LEVBID) 0.375 MG 12 hr tablet TAKE ONE TABLET BY MOUTH TWICE DAILY (Patient taking differently: as needed.)   montelukast (SINGULAIR) 10 MG  tablet Take 1 tablet (10 mg total) by mouth at bedtime.   omeprazole (PRILOSEC) 40 MG capsule Take 40 mg by mouth daily.   potassium chloride SA (KLOR-CON M) 20 MEQ tablet TAKE ONE TABLET TWICE DAILY   rosuvastatin (CRESTOR) 5 MG tablet TAKE 1 TABLET BY MOUTH DAILY   triamterene-hydrochlorothiazide (MAXZIDE-25) 37.5-25 MG tablet TAKE 1 TABLET BY MOUTH DAILY   [DISCONTINUED] levothyroxine (SYNTHROID) 100 MCG tablet TAKE 1 TABLET EVERY DAY ON EMPTY STOMACHWITH A GLASS OF WATER AT LEAST 30-60 MINBEFORE BREAKFAST   [DISCONTINUED] levothyroxine (SYNTHROID) 25 MCG tablet Take 1 tablet (25 mcg total) by mouth daily. To take with your 100 mcg tablet.   [DISCONTINUED] sertraline (ZOLOFT) 50 MG tablet TAKE 1 TABLET BY MOUTH DAILY.   [DISCONTINUED] naproxen sodium (ALEVE) 220 MG tablet Take 220 mg by mouth 2 (two) times daily as needed.   No facility-administered medications prior to visit.    Review of Systems  Constitutional:  Negative for appetite change, chills, fatigue and fever.  Respiratory:  Negative for chest tightness and shortness of breath.   Cardiovascular:  Negative for chest pain and palpitations.  Gastrointestinal:  Negative for abdominal pain, nausea and vomiting.  Neurological:  Negative for dizziness and weakness.       Objective    BP 138/89 (BP Location: Left Arm, Patient Position: Sitting, Cuff Size: Large)   Pulse 84   Resp 16   Wt 206 lb (93.4 kg)   SpO2 99%   BMI 34.28 kg/m    Physical Exam Vitals reviewed.  Constitutional:      General: She is not in acute distress.    Appearance: Normal appearance. She is well-developed. She is not diaphoretic.  HENT:     Head: Normocephalic and atraumatic.     Right Ear: Tympanic membrane, ear canal and external ear normal.     Left Ear: Tympanic membrane, ear canal and external ear normal.     Nose: Nose normal.     Mouth/Throat:     Mouth: Mucous membranes are moist.     Pharynx: Oropharynx is clear. No oropharyngeal  exudate.  Eyes:     General: No scleral icterus.    Conjunctiva/sclera: Conjunctivae normal.     Pupils: Pupils are equal, round, and reactive to light.  Neck:     Thyroid: No thyromegaly.  Cardiovascular:     Rate and Rhythm: Normal rate and regular rhythm.     Pulses: Normal pulses.     Heart sounds: Normal heart sounds. No murmur heard. Pulmonary:     Effort: Pulmonary effort is normal. No respiratory distress.     Breath sounds: Normal breath sounds. No wheezing or rales.  Abdominal:     General: There is no distension.     Palpations: Abdomen is soft.     Tenderness: There is no abdominal tenderness.  Musculoskeletal:  General: No deformity.     Cervical back: Neck supple.     Right lower leg: No edema.     Left lower leg: No edema.  Lymphadenopathy:     Cervical: No cervical adenopathy.  Skin:    General: Skin is warm and dry.     Findings: No rash.  Neurological:     Mental Status: She is alert and oriented to person, place, and time. Mental status is at baseline.     Sensory: No sensory deficit.     Motor: No weakness.     Gait: Gait normal.  Psychiatric:        Mood and Affect: Mood normal.        Behavior: Behavior normal.        Thought Content: Thought content normal.       No results found for any visits on 12/27/21.  Assessment & Plan     Problem List Items Addressed This Visit       Cardiovascular and Mediastinum   Essential hypertension - Primary    Well controlled Continue current medications Recheck metabolic panel        Endocrine   Adult hypothyroidism    Well controlled on new dose of Synthroid - reviewed recent TSH Will recheck at next visit      Relevant Medications   levothyroxine (SYNTHROID) 125 MCG tablet     Other   Obesity    Congratulated on weight loss Increased physical activity Discussed importance of healthy weight management Discussed diet and exercise       Prediabetes    Encourage low carb  diet Recheck A1c at next visit      Anxiety    Doing well off of Zoloft  Will continue to see how she does off of it        Return in about 5 months (around 05/28/2022) for AWV, CPE after 05/30/22.      I, Angela Paganini, MD, have reviewed all documentation for this visit. The documentation on 12/27/21 for the exam, diagnosis, procedures, and orders are all accurate and complete.   Anntonette Madewell, Dionne Bucy, MD, MPH [facility]

## 2021-12-27 NOTE — Assessment & Plan Note (Signed)
Doing well off of Zoloft  Will continue to see how she does off of it

## 2021-12-27 NOTE — Assessment & Plan Note (Signed)
Well controlled Continue current medications Recheck metabolic panel 

## 2021-12-27 NOTE — Assessment & Plan Note (Signed)
Encourage low carb diet Recheck A1c at next visit

## 2021-12-27 NOTE — Assessment & Plan Note (Signed)
Congratulated on weight loss Increased physical activity Discussed importance of healthy weight management Discussed diet and exercise

## 2022-01-06 DIAGNOSIS — M62830 Muscle spasm of back: Secondary | ICD-10-CM | POA: Diagnosis not present

## 2022-01-06 DIAGNOSIS — M25511 Pain in right shoulder: Secondary | ICD-10-CM | POA: Diagnosis not present

## 2022-01-06 DIAGNOSIS — M5412 Radiculopathy, cervical region: Secondary | ICD-10-CM | POA: Diagnosis not present

## 2022-01-06 DIAGNOSIS — M9901 Segmental and somatic dysfunction of cervical region: Secondary | ICD-10-CM | POA: Diagnosis not present

## 2022-01-20 DIAGNOSIS — L03213 Periorbital cellulitis: Secondary | ICD-10-CM | POA: Diagnosis not present

## 2022-01-24 DIAGNOSIS — L03213 Periorbital cellulitis: Secondary | ICD-10-CM | POA: Diagnosis not present

## 2022-02-04 DIAGNOSIS — L03213 Periorbital cellulitis: Secondary | ICD-10-CM | POA: Diagnosis not present

## 2022-02-12 DIAGNOSIS — L232 Allergic contact dermatitis due to cosmetics: Secondary | ICD-10-CM | POA: Diagnosis not present

## 2022-02-16 ENCOUNTER — Other Ambulatory Visit: Payer: Self-pay | Admitting: Family Medicine

## 2022-02-16 DIAGNOSIS — I10 Essential (primary) hypertension: Secondary | ICD-10-CM

## 2022-02-17 DIAGNOSIS — H35033 Hypertensive retinopathy, bilateral: Secondary | ICD-10-CM | POA: Diagnosis not present

## 2022-02-26 DIAGNOSIS — H35372 Puckering of macula, left eye: Secondary | ICD-10-CM | POA: Diagnosis not present

## 2022-03-05 ENCOUNTER — Telehealth: Payer: Self-pay

## 2022-03-05 NOTE — Telephone Encounter (Signed)
Patient called regarding rash under her right breast. She states you have seen this in the past and prescribed Alcortin-A.   Bring patient in for a office visit or send something for the holiday?

## 2022-03-06 ENCOUNTER — Telehealth: Payer: Self-pay

## 2022-03-06 MED ORDER — HYDROCORTISONE 2.5 % EX CREA: TOPICAL_CREAM | Freq: Two times a day (BID) | CUTANEOUS | 0 refills | Status: DC | PRN

## 2022-03-06 MED ORDER — KETOCONAZOLE 2 % EX CREA: 1.0000 | TOPICAL_CREAM | Freq: Every day | CUTANEOUS | 0 refills | Status: DC

## 2022-03-06 NOTE — Telephone Encounter (Signed)
Left message for patient with information per Dr. Nehemiah Massed. aw

## 2022-03-06 NOTE — Telephone Encounter (Signed)
Patient called returning Estill Bamberg White's (RMA) phone call. Patient prefers to have two separate prescriptions sent to [facility]. Johnsie Kindred has already sent those prescriptions in for her.

## 2022-03-13 ENCOUNTER — Other Ambulatory Visit: Payer: Self-pay

## 2022-03-13 MED ORDER — KETOCONAZOLE 2 % EX CREA: 1.0000 | TOPICAL_CREAM | Freq: Every day | CUTANEOUS | 0 refills | Status: AC

## 2022-03-13 NOTE — Progress Notes (Signed)
Patient called regarding Ketoconazole Cream not being at [place]. RX resent. aw

## 2022-04-03 ENCOUNTER — Ambulatory Visit: Payer: Self-pay | Admitting: *Deleted

## 2022-04-03 ENCOUNTER — Encounter: Payer: Self-pay | Admitting: Family Medicine

## 2022-04-03 ENCOUNTER — Telehealth (INDEPENDENT_AMBULATORY_CARE_PROVIDER_SITE_OTHER): Payer: Medicare Other | Admitting: Family Medicine

## 2022-04-03 VITALS — Temp 98.5°F | Wt 204.0 lb

## 2022-04-03 DIAGNOSIS — U071 COVID-19: Secondary | ICD-10-CM | POA: Diagnosis not present

## 2022-04-03 MED ORDER — NIRMATRELVIR/RITONAVIR (PAXLOVID)TABLET: 3.0000 | ORAL_TABLET | Freq: Two times a day (BID) | ORAL | 0 refills | Status: AC

## 2022-04-03 NOTE — Progress Notes (Signed)
I,Sulibeya S Dimas,acting as a Education administrator for [name], MD.,have documented all relevant documentation on the behalf of [name], MD,as directed by  [name], MD while in the presence of [name], MD.  MyChart Video Visit    Virtual Visit via Video Note   This format is felt to be most appropriate for this patient at this time. Physical exam was limited by quality of the video and audio technology used for the visit.    Patient location: home Provider location: [place] involved in the visit: patient, provider   I discussed the limitations of evaluation and management by telemedicine and the availability of in person appointments. The patient expressed understanding and agreed to proceed.  Patient: Angela Villarreal   DOB: 1955/04/23   67 y.o. Female  MRN: [id number] Visit Date: 04/03/2022  Today's healthcare provider: [name], MD   Chief Complaint  Patient presents with   Covid Positive   Subjective    HPI  Patient reports testing positive for COVID today. She reports symptoms started on Tuesday. She reports cough, congestion, headache and body aches. She reports taking OTC generic coricidin, Tylenol and Delsym.   Medications: Outpatient Medications Prior to Visit  Medication Sig   albuterol (VENTOLIN HFA) 108 (90 Base) MCG/ACT inhaler Inhale 1-2 puffs into the lungs every 4 (four) hours as needed for wheezing or shortness of breath.   ALLERGY RELIEF 10 MG tablet TAKE 1 TABLET BY MOUTH DAILY.   diltiazem (CARDIZEM CD) 120 MG 24 hr capsule TAKE 1 CAPSULE (120 MG) BY MOUTH EVERY DAY   fluticasone (FLONASE) 50 MCG/ACT nasal spray 2 SPRAYS IN EACH NOSTRIL EVERY DAY   fluticasone-salmeterol (ADVAIR HFA) 115-21 MCG/ACT inhaler Inhale 2 puffs into the lungs 2 (two) times daily.   hydrocortisone 2.5 % cream Apply topically 2 (two) times daily as needed (Rash).   hyoscyamine (LEVBID) 0.375 MG 12 hr tablet TAKE ONE  TABLET BY MOUTH TWICE DAILY (Patient taking differently: as needed.)   ketoconazole (NIZORAL) 2 % cream Apply 1 Application topically daily.   levothyroxine (SYNTHROID) 125 MCG tablet Take 1 tablet (125 mcg total) by mouth daily before breakfast.   montelukast (SINGULAIR) 10 MG tablet Take 1 tablet (10 mg total) by mouth at bedtime.   omeprazole (PRILOSEC) 40 MG capsule Take 40 mg by mouth daily.   potassium chloride SA (KLOR-CON M) 20 MEQ tablet TAKE ONE TABLET TWICE DAILY   rosuvastatin (CRESTOR) 5 MG tablet TAKE 1 TABLET BY MOUTH DAILY   triamterene-hydrochlorothiazide (MAXZIDE-25) 37.5-25 MG tablet TAKE 1 TABLET BY MOUTH DAILY   No facility-administered medications prior to visit.    Review of Systems per HPI     Objective    There were no vitals taken for this visit.     Physical Exam Constitutional:      General: She is not in acute distress.    Appearance: Normal appearance.  HENT:     Head: Normocephalic.  Pulmonary:     Effort: Pulmonary effort is normal. No respiratory distress.  Neurological:     Mental Status: She is alert and oriented to person, place, and time. Mental status is at baseline.        Assessment & Plan     1. COVID-19 - symptoms c/w viral URI  - home test positive COVID19 infection - discussed benefit and side effects of antivirals - patient agrees to Paxlovid - discussed need to quarantine 5 days from start of symptoms  and until fever-free for at least 24 hours - discussed symptomatic management, natural course, and return precautions    No orders of the defined types were placed in this encounter.    No follow-ups on file.     I discussed the assessment and treatment plan with the patient. The patient was provided an opportunity to ask questions and all were answered. The patient agreed with the plan and demonstrated an understanding of the instructions.   The patient was advised to call back or seek an in-person evaluation if the  symptoms worsen or if the condition fails to improve as anticipated.  I, [name], MD, have reviewed all documentation for this visit. The documentation on 04/03/22 for the exam, diagnosis, procedures, and orders are all accurate and complete.   Sherrika Weakland, Dionne Bucy, MD, MPH [facility]

## 2022-04-03 NOTE — Telephone Encounter (Signed)
Summary: Pt tested positive for Covid and request Rx   Pt stated she tested positive for Covid and she would like to know if she can be prescribed an antiviral medication. Pt stated she has respiratory issues so she would like a call back to advise. Cb# [phone]         Reason for Disposition  [1] HIGH RISK patient (e.g., weak immune system, age > 54 years, obesity with BMI 30 or higher, pregnant, chronic lung disease or other chronic medical condition) AND [2] COVID symptoms (e.g., cough, fever)  (Exceptions: Already seen by PCP and no new or worsening symptoms.)  Answer Assessment - Initial Assessment Questions 1. COVID-19 DIAGNOSIS: "How do you know that you have COVID?" (e.g., positive lab test or self-test, diagnosed by doctor or NP/PA, symptoms after exposure).     + COVID- home test- today 2. COVID-19 EXPOSURE: "Was there any known exposure to COVID before the symptoms began?" CDC Definition of close contact: within 6 feet (2 meters) for a total of 15 minutes or more over a 24-hour period.      unknown 3. ONSET: "When did the COVID-19 symptoms start?"      Started 2 days ago 4. WORST SYMPTOM: "What is your worst symptom?" (e.g., cough, fever, shortness of breath, muscle aches)     Congestion  5. COUGH: "Do you have a cough?" If Yes, ask: "How bad is the cough?"       Yes- not productive 6. FEVER: "Do you have a fever?" If Yes, ask: "What is your temperature, how was it measured, and when did it start?"     no 7. RESPIRATORY STATUS: "Describe your breathing?" (e.g., normal; shortness of breath, wheezing, unable to speak)      No SOB- does have asthma 8. BETTER-SAME-WORSE: "Are you getting better, staying the same or getting worse compared to yesterday?"  If getting worse, ask, "In what way?"     Worse- more symptoms 9. OTHER SYMPTOMS: "Do you have any other symptoms?"  (e.g., chills, fatigue, headache, loss of smell or taste, muscle pain, sore throat)     Achy, headache,  congestion, cough 10. HIGH RISK DISEASE: "Do you have any chronic medical problems?" (e.g., asthma, heart or lung disease, weak immune system, obesity, etc.)       asthma 11. VACCINE: "Have you had the COVID-19 vaccine?" If Yes, ask: "Which one, how many shots, when did you get it?"       Up to date- 01/2022 12. PREGNANCY: "Is there any chance you are pregnant?" "When was your last menstrual period?"       na  Protocols used: Coronavirus (COVID-19) Diagnosed or Suspected-A-AH

## 2022-04-03 NOTE — Telephone Encounter (Signed)
  Chief Complaint: + COVID Symptoms: cough, congestion, headache, body aches Frequency: symptoms started Tuesday Pertinent Negatives: Patient denies fever,SOB Disposition: '[]'$ ED /'[]'$ Urgent Care (no appt availability in office) / '[x]'$ Appointment(In office/virtual)/ '[]'$  [facility]/ '[]'$ Home Care/ '[]'$ Refused Recommended Disposition /'[]'$ [facility]/ '[]'$  Follow-up with PCP Additional Notes: Patient advised per COVID protocol: treatment/isolation, virtual visit scheduled

## 2022-04-17 ENCOUNTER — Ambulatory Visit (INDEPENDENT_AMBULATORY_CARE_PROVIDER_SITE_OTHER): Payer: Medicare Other | Admitting: Physician Assistant

## 2022-04-17 ENCOUNTER — Encounter: Payer: Self-pay | Admitting: Physician Assistant

## 2022-04-17 VITALS — BP 151/87 | HR 93 | Temp 98.4°F | Ht 65.0 in | Wt 205.0 lb

## 2022-04-17 DIAGNOSIS — J029 Acute pharyngitis, unspecified: Secondary | ICD-10-CM

## 2022-04-17 DIAGNOSIS — U071 COVID-19: Secondary | ICD-10-CM

## 2022-04-17 LAB — POCT RAPID STREP A (OFFICE): Rapid Strep A Screen: NEGATIVE

## 2022-04-17 NOTE — Progress Notes (Unsigned)
Established patient visit   Patient: Angela Villarreal   DOB: Aug 08, 1955   67 y.o. Female  MRN: [id number] Visit Date: 04/17/2022  Today's healthcare provider: Mardene Speak, PA-C   Chief Complaint  Patient presents with   Sore Throat   Subjective    Sore Throat  This is a new problem. The current episode started today. The problem has been unchanged. The pain is worse on the left side. There has been no fever. The pain is at a severity of 5/10. The pain is mild. Associated symptoms include a plugged ear sensation. Associated symptoms comments: Cough-lingering on since having covid 2 weeks ago . She has tried nothing for the symptoms.    Has medical hx significant for airway hypersensitivity and allergic rhinitis Reports that she is "allergic to a lot of things, lilies does not agree with me"  Medications: Outpatient Medications Prior to Visit  Medication Sig   albuterol (VENTOLIN HFA) 108 (90 Base) MCG/ACT inhaler Inhale 1-2 puffs into the lungs every 4 (four) hours as needed for wheezing or shortness of breath.   budesonide-formoterol (SYMBICORT) 160-4.5 MCG/ACT inhaler Inhale 2 puffs into the lungs 2 (two) times daily.   diltiazem (CARDIZEM CD) 120 MG 24 hr capsule TAKE 1 CAPSULE (120 MG) BY MOUTH EVERY DAY   fluticasone (FLONASE) 50 MCG/ACT nasal spray 2 SPRAYS IN EACH NOSTRIL EVERY DAY   hydrocortisone 2.5 % cream Apply topically 2 (two) times daily as needed (Rash).   hyoscyamine (LEVBID) 0.375 MG 12 hr tablet TAKE ONE TABLET BY MOUTH TWICE DAILY (Patient taking differently: as needed.)   levothyroxine (SYNTHROID) 125 MCG tablet Take 1 tablet (125 mcg total) by mouth daily before breakfast.   loratadine (CLARITIN) 10 MG tablet Take 10 mg by mouth daily.   montelukast (SINGULAIR) 10 MG tablet Take 1 tablet (10 mg total) by mouth at bedtime.   omeprazole (PRILOSEC) 40 MG capsule Take 40 mg by mouth daily.   potassium chloride SA (KLOR-CON M) 20 MEQ tablet TAKE ONE TABLET  TWICE DAILY   rosuvastatin (CRESTOR) 5 MG tablet TAKE 1 TABLET BY MOUTH DAILY   triamterene-hydrochlorothiazide (MAXZIDE-25) 37.5-25 MG tablet TAKE 1 TABLET BY MOUTH DAILY   [DISCONTINUED] budesonide-formoterol (SYMBICORT) 80-4.5 MCG/ACT inhaler Inhale 2 puffs into the lungs 2 (two) times daily.   [DISCONTINUED] fluticasone-salmeterol (ADVAIR HFA) 115-21 MCG/ACT inhaler Inhale 2 puffs into the lungs 2 (two) times daily.   No facility-administered medications prior to visit.    Review of Systems  All other systems reviewed and are negative. Except see HPI     Objective    BP (!) 151/87 (BP Location: Right Arm)   Pulse 93   Temp 98.4 F (36.9 C) (Oral)   Ht '5\' 5"'$  (1.651 m)   Wt 205 lb (93 kg)   SpO2 98%   BMI 34.11 kg/m    Physical Exam Vitals reviewed.  Constitutional:      General: She is not in acute distress.    Appearance: Normal appearance. She is well-developed. She is obese. She is not diaphoretic.  HENT:     Head: Normocephalic and atraumatic.     Right Ear: Tympanic membrane and ear canal normal. No drainage, swelling or tenderness. No middle ear effusion. Tympanic membrane is not erythematous.     Left Ear: Ear canal normal. No drainage, swelling or tenderness. A middle ear effusion is present. Tympanic membrane is not erythematous.     Nose: Congestion and rhinorrhea present.  Mouth/Throat:     Mouth: No oral lesions.     Pharynx: Uvula midline. Posterior oropharyngeal erythema (mild) present. No pharyngeal swelling or uvula swelling.     Tonsils: No tonsillar exudate.  Eyes:     General: No scleral icterus.    Extraocular Movements:     Right eye: Normal extraocular motion.     Left eye: Normal extraocular motion.     Conjunctiva/sclera: Conjunctivae normal.     Pupils: Pupils are equal, round, and reactive to light.  Neck:     Thyroid: No thyromegaly.  Cardiovascular:     Rate and Rhythm: Normal rate and regular rhythm.     Pulses: Normal pulses.      Heart sounds: Normal heart sounds. No murmur heard. Pulmonary:     Effort: Pulmonary effort is normal. No respiratory distress.     Breath sounds: Normal breath sounds. No wheezing, rhonchi or rales.  Abdominal:     General: Bowel sounds are normal.  Musculoskeletal:     Cervical back: Neck supple.     Right lower leg: No edema.     Left lower leg: No edema.  Lymphadenopathy:     Cervical: No cervical adenopathy.  Skin:    General: Skin is warm and dry.     Findings: No rash.  Neurological:     Mental Status: She is alert and oriented to person, place, and time. Mental status is at baseline.  Psychiatric:        Mood and Affect: Mood normal.        Behavior: Behavior normal.       Results for orders placed or performed in visit on 04/17/22  POCT rapid strep A  Result Value Ref Range   Rapid Strep A Screen Negative Negative    Assessment & Plan     Sore throat - Plan: POCT rapid strep A X 1 day In the setting of post viral cough, allergic rhinitis Continue symptomatic treatment:. Increase fluids.  Rest.  Saline nasal spray. Mucinex as directed.  Humidifier in bedroom. Flonase per orders. Claritin '10mg'$ , montelukast '10mg'$ , symbicort 160-4mg as prescribed. Warm salt gargle.  Call or return to clinic if symptoms are not improving. - explained  treatment options that will help with symptoms : symptomatic management (see above - discussed concerning symptoms like high fever, SOB, facial pain. The patient was advised to call back or seek an in-person evaluation if the symptoms worsen or if the condition fails to improve as anticipated.   I discussed the assessment and treatment plan with the patient. The patient was provided an opportunity to ask questions and all were answered. The patient agreed with the plan and demonstrated an understanding of the instructions.  The entirety of the information documented in the History of Present Illness, Review of Systems and Physical Exam  were personally obtained by me. Portions of this information were initially documented by the CMA and reviewed by me for thoroughness and accuracy.  JMardene Speak PDelta Medical Center [phone](phone) [phone](fax)

## 2022-05-05 ENCOUNTER — Telehealth: Payer: Self-pay | Admitting: Family Medicine

## 2022-05-05 NOTE — Telephone Encounter (Signed)
Contacted KARSIN MOLINERO to schedule their annual wellness visit. Appointment made for 06/03/2022.  [facility] Direct Dial: [phone]

## 2022-05-30 ENCOUNTER — Other Ambulatory Visit: Payer: Self-pay | Admitting: Family Medicine

## 2022-05-30 DIAGNOSIS — E876 Hypokalemia: Secondary | ICD-10-CM

## 2022-06-03 ENCOUNTER — Encounter: Payer: Medicare PPO | Admitting: Family Medicine

## 2022-06-03 ENCOUNTER — Ambulatory Visit (INDEPENDENT_AMBULATORY_CARE_PROVIDER_SITE_OTHER): Payer: Medicare Other

## 2022-06-03 VITALS — Ht 66.0 in | Wt 205.0 lb

## 2022-06-03 DIAGNOSIS — Z0000 Encounter for general adult medical examination without abnormal findings: Secondary | ICD-10-CM | POA: Diagnosis not present

## 2022-06-03 DIAGNOSIS — Z1231 Encounter for screening mammogram for malignant neoplasm of breast: Secondary | ICD-10-CM

## 2022-06-03 NOTE — Progress Notes (Signed)
I connected with  Angela Villarreal on 06/03/22 by a audio enabled telemedicine application and verified that I am speaking with the correct person using two identifiers.  Patient Location: Home  Provider Location: Office/Clinic  I discussed the limitations of evaluation and management by telemedicine. The patient expressed understanding and agreed to proceed.  Subjective:   Angela Villarreal is a 67 y.o. female who presents for Medicare Annual (Subsequent) preventive examination.  Review of Systems    Cardiac Risk Factors include: advanced age (>88men, >44 women);dyslipidemia;hypertension;obesity (BMI >30kg/m2)    Objective:    Today's Vitals   06/03/22 1514  Weight: 205 lb (93 kg)  Height: 5\' 6"  (1.676 m)   Body mass index is 33.09 kg/m.     06/03/2022    3:19 PM 06/19/2021    2:44 PM 06/18/2021   10:33 AM 06/17/2021    6:09 PM 06/17/2021    9:11 AM 03/26/2021    8:52 AM 11/27/2017    7:57 AM  Advanced Directives  Does Patient Have a Medical Advance Directive? No No No No No No Yes  Would patient like information on creating a medical advance directive?  No - Patient declined No - Patient declined No - Patient declined  No - Patient declined     Current Medications (verified) Outpatient Encounter Medications as of 06/03/2022  Medication Sig   albuterol (VENTOLIN HFA) 108 (90 Base) MCG/ACT inhaler Inhale 1-2 puffs into the lungs every 4 (four) hours as needed for wheezing or shortness of breath.   budesonide-formoterol (SYMBICORT) 160-4.5 MCG/ACT inhaler Inhale 2 puffs into the lungs 2 (two) times daily.   diltiazem (CARDIZEM CD) 120 MG 24 hr capsule TAKE 1 CAPSULE (120 MG) BY MOUTH EVERY DAY   fluticasone (FLONASE) 50 MCG/ACT nasal spray 2 SPRAYS IN EACH NOSTRIL EVERY DAY   hydrocortisone 2.5 % cream Apply topically 2 (two) times daily as needed (Rash).   hyoscyamine (LEVBID) 0.375 MG 12 hr tablet TAKE ONE TABLET BY MOUTH TWICE DAILY (Patient taking differently: as needed.)    levothyroxine (SYNTHROID) 125 MCG tablet Take 1 tablet (125 mcg total) by mouth daily before breakfast.   loratadine (CLARITIN) 10 MG tablet Take 10 mg by mouth daily.   montelukast (SINGULAIR) 10 MG tablet Take 1 tablet (10 mg total) by mouth at bedtime.   omeprazole (PRILOSEC) 40 MG capsule Take 40 mg by mouth daily.   potassium chloride SA (KLOR-CON M) 20 MEQ tablet TAKE ONE TABLET TWICE DAILY   rosuvastatin (CRESTOR) 5 MG tablet TAKE 1 TABLET BY MOUTH DAILY   triamterene-hydrochlorothiazide (MAXZIDE-25) 37.5-25 MG tablet TAKE 1 TABLET BY MOUTH DAILY   No facility-administered encounter medications on file as of 06/03/2022.    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 AMP. Excised: 04/19/2009, margins free.   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 patient reports she also had ovaries removed.    [place]   BLADDER SURGERY  2001   Tacked   CHOLECYSTECTOMY  06/2021   COLONOSCOPY WITH PROPOFOL N/A 11/27/2017   Procedure: COLONOSCOPY WITH PROPOFOL;  Surgeon: Jonathon Bellows,  MD;  Location: [facility] ENDOSCOPY;  Service: Gastroenterology;  Laterality: N/A;   COLONOSCOPY WITH PROPOFOL N/A 03/26/2021   Procedure: COLONOSCOPY WITH PROPOFOL;  Surgeon: Jonathon Bellows, MD;  Location: [facility] ENDOSCOPY;  Service: Gastroenterology;  Laterality: N/A;   ERCP N/A 06/18/2021   Procedure: ENDOSCOPIC RETROGRADE CHOLANGIOPANCREATOGRAPHY (ERCP);  Surgeon: Lucilla Lame, MD;  Location: [facility] ENDOSCOPY;  Service: Endoscopy;  Laterality: N/A;   MELANOMA EXCISION  02/2004   removed from face   OOPHORECTOMY     Family History  Problem Relation  Age of Onset   Colon cancer Mother    Squamous cell carcinoma Mother    Alzheimer's disease Mother    Cancer Mother        colon   Hypertension Mother    Non-Hodgkin's lymphoma Father    Cancer Father    Varicose Veins Father    Thyroid disease Sister    Breast cancer Neg Hx    Social History   Socioeconomic History   Marital status: Divorced    Spouse name: Not on file   Number of children: Not on file   Years of education: Not on file   Highest education level: Not on file  Occupational History   Not on file  Tobacco Use   Smoking status: Never   Smokeless tobacco: Never  Vaping Use   Vaping Use: Never used  Substance and Sexual Activity   Alcohol use: Yes    Comment: 2-3 drinks when in social situation 6-8 times/year   Drug use: No   Sexual activity: Yes    Birth control/protection: Post-menopausal  Other Topics Concern   Not on file  Social History Narrative   Not on file   Social Determinants of Health   Financial Resource Strain: Low Risk  (06/02/2022)   Overall Financial Resource Strain (CARDIA)    Difficulty of Paying Living Expenses: Not very hard  Food Insecurity: No Food Insecurity (06/02/2022)   Hunger Vital Sign    Worried About Running Out of Food in the Last Year: Never true    Ran Out of Food in the Last Year: Never true  Transportation Needs: No Transportation Needs (06/02/2022)   PRAPARE - Hydrologist (Medical): No    Lack of Transportation (Non-Medical): No  Physical Activity: Sufficiently Active (06/02/2022)   Exercise Vital Sign    Days of Exercise per Week: 5 days    Minutes of Exercise per Session: 30 min  Stress: No Stress Concern Present (06/02/2022)   [place]    Feeling of Stress : Only a little  Social Connections: Unknown (06/02/2022)   Social Connection and Isolation Panel [NHANES]    Frequency of Communication with Friends and Family:  More than three times a week    Frequency of Social Gatherings with Friends and Family: More than three times a week    Attends Religious Services: Not on Advertising copywriter or Organizations: Yes    Attends Music therapist: More than 4 times per year    Marital Status: Living with partner    Tobacco Counseling Counseling given: Not Answered   Clinical Intake:     Pain : No/denies pain     BMI - recorded: 33.09 Nutritional Status: BMI > 30  Obese Nutritional Risks: None Diabetes: No  How often do you need to have someone help you when you read instructions, pamphlets, or other written materials from  your doctor or pharmacy?: 1 - Never  Diabetic?no  Interpreter Needed?: No  Comments: lives w/partner Information entered by :: B.Takirah Binford,LPN   Activities of Daily Living    06/02/2022    9:18 PM 04/17/2022   11:12 AM  In your present state of health, do you have any difficulty performing the following activities:  Hearing? 0 0  Vision? 0 0  Difficulty concentrating or making decisions? 0 0  Walking or climbing stairs? 0 0  Dressing or bathing? 0 0  Doing errands, shopping? 0 0  Preparing Food and eating ? N   Using the Toilet? N   In the past six months, have you accidently leaked urine? N   Do you have problems with loss of bowel control? N   Managing your Medications? N   Managing your Finances? N   Housekeeping or managing your Housekeeping? N     Patient Care Team: Virginia Crews, MD as PCP - General (Family Medicine) Ralene Bathe, MD (Dermatology) Beverly Gust, MD (Otolaryngology) Jonathon Bellows, MD as Consulting Physician (Gastroenterology)  Indicate any recent Medical Services you may have received from other than [facility] providers in the past year (date may be approximate).     Assessment:   This is a routine wellness examination for Angela Villarreal.  Hearing/Vision screen Hearing Screening - Comments:: Adequate  hearing Vision Screening - Comments:: Adequate vision w/glasses [facility]  Dietary issues and exercise activities discussed: Current Exercise Habits: Home exercise routine, Type of exercise: walking, Time (Minutes): 30, Intensity: Mild, Exercise limited by: orthopedic condition(s)   Goals Addressed   None    Depression Screen    06/03/2022    3:17 PM 04/17/2022   11:12 AM 12/27/2021   10:50 AM 12/10/2021    1:20 PM 11/11/2021   11:05 AM 10/07/2021    9:47 AM 08/30/2021    9:47 AM  PHQ 2/9 Scores  PHQ - 2 Score 0 0 0 0 1 0 1  PHQ- 9 Score 0 0 0 0 6 1 2     Fall Risk    06/02/2022    9:18 PM 04/17/2022   11:12 AM 12/27/2021   10:50 AM 12/10/2021    1:20 PM 11/11/2021   11:05 AM  Fall Risk   Falls in the past year? 0 0 0 0 0  Number falls in past yr:  0 0 0 0  Injury with Fall?  0 0 0 0  Risk for fall due to :  No Fall Risks No Fall Risks No Fall Risks No Fall Risks  Follow up  Falls evaluation completed Falls evaluation completed Falls evaluation completed     FALL RISK PREVENTION PERTAINING TO THE HOME:  Any stairs in or around the home? Yes  If so, are there any without handrails? Yes  Home free of loose throw rugs in walkways, pet beds, electrical cords, etc? Yes  Adequate lighting in your home to reduce risk of falls? Yes   ASSISTIVE DEVICES UTILIZED TO PREVENT FALLS:  Life alert? No  Use of a cane, walker or w/c? No  Grab bars in the bathroom? No  Shower chair or bench in shower? No  Elevated toilet seat or a handicapped toilet? Yes    Cognitive Function:        06/03/2022    3:20 PM  6CIT Screen  What Year? 0 points  What month? 0 points  What time? 0 points  Count back from 20 0 points  Months in reverse  0 points  Repeat phrase 0 points  Total Score 0 points    Immunizations Immunization History  Administered Date(s) Administered   Influenza, High Dose Seasonal PF 02/13/2021   Influenza,inj,Quad PF,6+ Mos 01/05/2018, 01/05/2019, 01/19/2020    Influenza-Unspecified 01/02/2017   PFIZER(Purple Top)SARS-COV-2 Vaccination 05/22/2019, 06/12/2019, 02/06/2020   PNEUMOCOCCAL CONJUGATE-20 05/28/2021   Pfizer Covid-19 Vaccine Bivalent Booster 29yrs & up 07/31/2020, 02/13/2021   Tdap 04/15/2010, 01/19/2020   Zoster Recombinat (Shingrix) 11/03/2017, 01/05/2018    TDAP status: Up to date  Flu Vaccine status: Up to date  Pneumococcal vaccine status: Up to date  Covid-19 vaccine status: Completed vaccines  Qualifies for Shingles Vaccine? Yes   Zostavax completed Yes   Shingrix Completed?: Yes  Screening Tests Health Maintenance  Topic Date Due   COVID-19 Vaccine (6 - 2023-24 season) 11/15/2021   INFLUENZA VACCINE  06/15/2022 (Originally 10/15/2021)   Medicare Annual Wellness (AWV)  06/03/2023   MAMMOGRAM  07/31/2023   COLONOSCOPY (Pts 45-49yrs Insurance coverage will need to be confirmed)  03/26/2024   DTaP/Tdap/Td (3 - Td or Tdap) 01/18/2030   Pneumonia Vaccine 71+ Years old  Completed   DEXA SCAN  Completed   Hepatitis C Screening  Completed   Zoster Vaccines- Shingrix  Completed   HPV VACCINES  Aged Out    Health Maintenance  Health Maintenance Due  Topic Date Due   COVID-19 Vaccine (6 - 2023-24 season) 11/15/2021    Colorectal cancer screening: Type of screening: Colonoscopy. Completed yes. Repeat every 3-5 years  Mammogram status: Ordered yes. Pt provided with contact info and advised to call to schedule appt.   Bone Density status: Completed yes. Results reflect: Bone density results: NORMAL. Repeat every 5 years.  Lung Cancer Screening: (Low Dose CT Chest recommended if Age 71-80 years, 30 pack-year currently smoking OR have quit w/in 15years.) does not qualify.   Lung Cancer Screening Referral: no  Additional Screening:  Hepatitis C Screening: does not qualify; Completed yes  Vision Screening: Recommended annual ophthalmology exams for early detection of glaucoma and other disorders of the eye. Is the  patient up to date with their annual eye exam?  Yes  Who is the provider or what is the name of the office in which the patient attends annual eye exams? Dr Joya San If pt is not established with a provider, would they like to be referred to a provider to establish care? No .   Dental Screening: Recommended annual dental exams for proper oral hygiene  Community Resource Referral / Chronic Care Management: CRR required this visit?  No   CCM required this visit?  No      Plan:     I have personally reviewed and noted the following in the patient's chart:   Medical and social history Use of alcohol, tobacco or illicit drugs  Current medications and supplements including opioid prescriptions. Patient is not currently taking opioid prescriptions. Functional ability and status Nutritional status Physical activity Advanced directives List of other physicians Hospitalizations, surgeries, and ER visits in previous 12 months Vitals Screenings to include cognitive, depression, and falls Referrals and appointments  In addition, I have reviewed and discussed with patient certain preventive protocols, quality metrics, and best practice recommendations. A written personalized care plan for preventive services as well as general preventive health recommendations were provided to patient.     Roger Shelter, LPN   [id number]   Nurse Notes: pt is reportedly doing well and has no concerns or questions this visit

## 2022-06-03 NOTE — Patient Instructions (Signed)
[name] , Thank you for taking time to come for your Medicare Wellness Visit. I appreciate your ongoing commitment to your health goals. Please review the following plan we discussed and let me know if I can assist you in the future.   These are the goals we discussed:  Goals   None     This is a list of the screening recommended for you and due dates:  Health Maintenance  Topic Date Due   COVID-19 Vaccine (6 - 2023-24 season) 11/15/2021   Flu Shot  06/15/2022*   Medicare Annual Wellness Visit  06/03/2023   Mammogram  07/31/2023   Colon Cancer Screening  03/26/2024   DTaP/Tdap/Td vaccine (3 - Td or Tdap) 01/18/2030   Pneumonia Vaccine  Completed   DEXA scan (bone density measurement)  Completed   Hepatitis C Screening: USPSTF Recommendation to screen - Ages 68-79 yo.  Completed   Zoster (Shingles) Vaccine  Completed   HPV Vaccine  Aged Out  *Topic was postponed. The date shown is not the original due date.    Advanced directives: no mailed forms  Conditions/risks identified: none  Next appointment: Follow up in one year for your annual wellness visit 06/08/2023@ 1:30pm telephone   Preventive Care 65 Years and Older, Female Preventive care refers to lifestyle choices and visits with your health care provider that can promote health and wellness. What does preventive care include? A yearly physical exam. This is also called an annual well check. Dental exams once or twice a year. Routine eye exams. Ask your health care provider how often you should have your eyes checked. Personal lifestyle choices, including: Daily care of your teeth and gums. Regular physical activity. Eating a healthy diet. Avoiding tobacco and drug use. Limiting alcohol use. Practicing safe sex. Taking low-dose aspirin every day. Taking vitamin and mineral supplements as recommended by your health care provider. What happens during an annual well check? The services and screenings done by your  health care provider during your annual well check will depend on your age, overall health, lifestyle risk factors, and family history of disease. Counseling  Your health care provider may ask you questions about your: Alcohol use. Tobacco use. Drug use. Emotional well-being. Home and relationship well-being. Sexual activity. Eating habits. History of falls. Memory and ability to understand (cognition). Work and work Statistician. Reproductive health. Screening  You may have the following tests or measurements: Height, weight, and BMI. Blood pressure. Lipid and cholesterol levels. These may be checked every 5 years, or more frequently if you are over 23 years old. Skin check. Lung cancer screening. You may have this screening every year starting at age 49 if you have a 30-pack-year history of smoking and currently smoke or have quit within the past 15 years. Fecal occult blood test (FOBT) of the stool. You may have this test every year starting at age 30. Flexible sigmoidoscopy or colonoscopy. You may have a sigmoidoscopy every 5 years or a colonoscopy every 10 years starting at age 80. Hepatitis C blood test. Hepatitis B blood test. Sexually transmitted disease (STD) testing. Diabetes screening. This is done by checking your blood sugar (glucose) after you have not eaten for a while (fasting). You may have this done every 1-3 years. Bone density scan. This is done to screen for osteoporosis. You may have this done starting at age 36. Mammogram. This may be done every 1-2 years. Talk to your health care provider about how often you should have regular mammograms.  Talk with your health care provider about your test results, treatment options, and if necessary, the need for more tests. Vaccines  Your health care provider may recommend certain vaccines, such as: Influenza vaccine. This is recommended every year. Tetanus, diphtheria, and acellular pertussis (Tdap, Td) vaccine. You may  need a Td booster every 10 years. Zoster vaccine. You may need this after age 32. Pneumococcal 13-valent conjugate (PCV13) vaccine. One dose is recommended after age 29. Pneumococcal polysaccharide (PPSV23) vaccine. One dose is recommended after age 18. Talk to your health care provider about which screenings and vaccines you need and how often you need them. This information is not intended to replace advice given to you by your health care provider. Make sure you discuss any questions you have with your health care provider. Document Released: 03/30/2015 Document Revised: 11/21/2015 Document Reviewed: 01/02/2015 Elsevier Interactive Patient Education  2017 [place] Prevention in the Home Falls can cause injuries. They can happen to people of all ages. There are many things you can do to make your home safe and to help prevent falls. What can I do on the outside of my home? Regularly fix the edges of walkways and driveways and fix any cracks. Remove anything that might make you trip as you walk through a door, such as a raised step or threshold. Trim any bushes or trees on the path to your home. Use bright outdoor lighting. Clear any walking paths of anything that might make someone trip, such as rocks or tools. Regularly check to see if handrails are loose or broken. Make sure that both sides of any steps have handrails. Any raised decks and porches should have guardrails on the edges. Have any leaves, snow, or ice cleared regularly. Use sand or salt on walking paths during winter. Clean up any spills in your garage right away. This includes oil or grease spills. What can I do in the bathroom? Use night lights. Install grab bars by the toilet and in the tub and shower. Do not use towel bars as grab bars. Use non-skid mats or decals in the tub or shower. If you need to sit down in the shower, use a plastic, non-slip stool. Keep the floor dry. Clean up any water that spills on  the floor as soon as it happens. Remove soap buildup in the tub or shower regularly. Attach bath mats securely with double-sided non-slip rug tape. Do not have throw rugs and other things on the floor that can make you trip. What can I do in the bedroom? Use night lights. Make sure that you have a light by your bed that is easy to reach. Do not use any sheets or blankets that are too big for your bed. They should not hang down onto the floor. Have a firm chair that has side arms. You can use this for support while you get dressed. Do not have throw rugs and other things on the floor that can make you trip. What can I do in the kitchen? Clean up any spills right away. Avoid walking on wet floors. Keep items that you use a lot in easy-to-reach places. If you need to reach something above you, use a strong step stool that has a grab bar. Keep electrical cords out of the way. Do not use floor polish or wax that makes floors slippery. If you must use wax, use non-skid floor wax. Do not have throw rugs and other things on the floor that can make  you trip. What can I do with my stairs? Do not leave any items on the stairs. Make sure that there are handrails on both sides of the stairs and use them. Fix handrails that are broken or loose. Make sure that handrails are as long as the stairways. Check any carpeting to make sure that it is firmly attached to the stairs. Fix any carpet that is loose or worn. Avoid having throw rugs at the top or bottom of the stairs. If you do have throw rugs, attach them to the floor with carpet tape. Make sure that you have a light switch at the top of the stairs and the bottom of the stairs. If you do not have them, ask someone to add them for you. What else can I do to help prevent falls? Wear shoes that: Do not have high heels. Have rubber bottoms. Are comfortable and fit you well. Are closed at the toe. Do not wear sandals. If you use a stepladder: Make sure  that it is fully opened. Do not climb a closed stepladder. Make sure that both sides of the stepladder are locked into place. Ask someone to hold it for you, if possible. Clearly mark and make sure that you can see: Any grab bars or handrails. First and last steps. Where the edge of each step is. Use tools that help you move around (mobility aids) if they are needed. These include: Canes. Walkers. Scooters. Crutches. Turn on the lights when you go into a dark area. Replace any light bulbs as soon as they burn out. Set up your furniture so you have a clear path. Avoid moving your furniture around. If any of your floors are uneven, fix them. If there are any pets around you, be aware of where they are. Review your medicines with your doctor. Some medicines can make you feel dizzy. This can increase your chance of falling. Ask your doctor what other things that you can do to help prevent falls. This information is not intended to replace advice given to you by your health care provider. Make sure you discuss any questions you have with your health care provider. Document Released: 12/28/2008 Document Revised: 08/09/2015 Document Reviewed: 04/07/2014 Elsevier Interactive Patient Education  2017 [employer].

## 2022-06-03 NOTE — Addendum Note (Signed)
Addended by: Roger Shelter on: 06/03/2022 03:56 PM   Modules accepted: Level of Service

## 2022-06-16 NOTE — Progress Notes (Unsigned)
Angela Villarreal,Angela Villarreal,acting as a Education administrator for Angela Paganini, MD.,have documented all relevant documentation on the behalf of Angela Paganini, MD,as directed by  Angela Paganini, MD while in the presence of Angela Paganini, MD.    Complete physical exam   Patient: Angela Villarreal   DOB: 1955/04/24   67 y.o. Female  MRN: [id number] Visit Date: 06/17/2022  Today's healthcare provider: [name], MD   Chief Complaint  Patient presents with   Annual Exam   Subjective    Angela Villarreal is a 67 y.o. female who presents today for a complete physical exam.  She reports consuming a general diet. The patient does not participate in regular exercise at present. She generally feels well. She reports sleeping well. She does not have additional problems to discuss today.   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 AMP. Excised: 04/19/2009, margins free.   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 patient reports she also had ovaries removed.    [place]   BLADDER SURGERY  2001   Tacked   CHOLECYSTECTOMY  06/2021   COLONOSCOPY WITH PROPOFOL N/A 11/27/2017   Procedure: COLONOSCOPY WITH PROPOFOL;  Surgeon: Jonathon Bellows, MD;  Location: [facility] ENDOSCOPY;  Service: Gastroenterology;  Laterality: N/A;   COLONOSCOPY WITH PROPOFOL N/A 03/26/2021   Procedure: COLONOSCOPY WITH PROPOFOL;  Surgeon: Jonathon Bellows, MD;  Location: [facility] ENDOSCOPY;  Service: Gastroenterology;  Laterality: N/A;   ERCP N/A 06/18/2021   Procedure: ENDOSCOPIC RETROGRADE CHOLANGIOPANCREATOGRAPHY (ERCP);  Surgeon: Lucilla Lame, MD;  Location: [facility]  ENDOSCOPY;  Service: Endoscopy;  Laterality: N/A;   MELANOMA EXCISION  02/2004   removed from face   OOPHORECTOMY     Social History   Socioeconomic History   Marital status: Divorced    Spouse name: Not on file   Number of children: Not on file   Years of education: Not on file   Highest education level: Not on file  Occupational History   Not on file  Tobacco Use   Smoking status: Never   Smokeless tobacco: Never  Vaping Use   Vaping Use: Never used  Substance and Sexual Activity   Alcohol use: Yes    Comment: 2-3 drinks when in social situation 6-8 times/year   Drug use: No   Sexual activity: Yes    Birth control/protection: Post-menopausal  Other Topics Concern   Not on file  Social History Narrative   Not on file   Social Determinants of Health   Financial Resource Strain: Low Risk  (06/02/2022)   Overall Financial Resource Strain (CARDIA)    Difficulty of Paying Living Expenses: Not very hard  Food Insecurity: No Food Insecurity (06/02/2022)   Hunger Vital Sign    Worried About Running Out of Food in the Last Year: Never true    Ran Out of Food in the Last Year: Never true  Transportation Needs: No Transportation Needs (06/02/2022)   PRAPARE - Hydrologist (Medical): No    Lack of Transportation (Non-Medical): No  Physical Activity:  Sufficiently Active (06/02/2022)   Exercise Vital Sign    Days of Exercise per Week: 5 days    Minutes of Exercise per Session: 30 min  Stress: No Stress Concern Present (06/02/2022)   [place]    Feeling of Stress : Only a little  Social Connections: Unknown (06/02/2022)   Social Connection and Isolation Panel [NHANES]    Frequency of Communication with Friends and Family: More than three times a week    Frequency of Social Gatherings with Friends and Family: More than three times a week    Attends Religious Services: Not on file     Active Member of Clubs or Organizations: Yes    Attends Archivist Meetings: More than 4 times per year    Marital Status: Living with partner  Intimate Partner Violence: Not At Risk (06/03/2022)   Humiliation, Afraid, Rape, and Kick questionnaire    Fear of Current or Ex-Partner: No    Emotionally Abused: No    Physically Abused: No    Sexually Abused: No   Family Status  Relation Name Status   Mother EEW Deceased   Father JMW Deceased   Sister  Alive   Neg Hx  (Not Specified)   Family History  Problem Relation Age of Onset   Colon cancer Mother    Squamous cell carcinoma Mother    Alzheimer's disease Mother    Cancer Mother        colon   Hypertension Mother    Non-Hodgkin's lymphoma Father    Cancer Father    Varicose Veins Father    Thyroid disease Sister    Breast cancer Neg Hx    Allergies  Allergen Reactions   Amoxicillin Hives    Other reaction(s): Rash, Hives () Other reaction(s): Rash, Hives () Other reaction(s): Rash, Hives () Other reaction(s): Rash, Hives () Other reaction(s): Rash, Hives () Other reaction(s): Rash, Hives () Other reaction(s): Rash, Hives ()    Gentamicin Sulfate    Lisinopril Cough    Patient Care Team: Virginia Crews, MD as PCP - General (Family Medicine) Ralene Bathe, MD (Dermatology) Beverly Gust, MD (Otolaryngology) Jonathon Bellows, MD as Consulting Physician (Gastroenterology)   Medications: Outpatient Medications Prior to Visit  Medication Sig   albuterol (VENTOLIN HFA) 108 (90 Base) MCG/ACT inhaler Inhale 1-2 puffs into the lungs every 4 (four) hours as needed for wheezing or shortness of breath.   budesonide-formoterol (SYMBICORT) 160-4.5 MCG/ACT inhaler Inhale 2 puffs into the lungs 2 (two) times daily.   diltiazem (CARDIZEM CD) 120 MG 24 hr capsule TAKE 1 CAPSULE (120 MG) BY MOUTH EVERY DAY   fluticasone (FLONASE) 50 MCG/ACT nasal spray 2 SPRAYS IN EACH NOSTRIL EVERY DAY   hydrocortisone 2.5 % cream  Apply topically 2 (two) times daily as needed (Rash).   hyoscyamine (LEVBID) 0.375 MG 12 hr tablet TAKE ONE TABLET BY MOUTH TWICE DAILY (Patient taking differently: as needed.)   levothyroxine (SYNTHROID) 125 MCG tablet Take 1 tablet (125 mcg total) by mouth daily before breakfast.   loratadine (CLARITIN) 10 MG tablet Take 10 mg by mouth daily.   montelukast (SINGULAIR) 10 MG tablet Take 1 tablet (10 mg total) by mouth at bedtime.   omeprazole (PRILOSEC) 40 MG capsule Take 40 mg by mouth daily.   potassium chloride SA (KLOR-CON M) 20 MEQ tablet TAKE ONE TABLET TWICE DAILY   rosuvastatin (CRESTOR) 5 MG tablet TAKE 1 TABLET BY MOUTH DAILY  triamterene-hydrochlorothiazide (MAXZIDE-25) 37.5-25 MG tablet TAKE 1 TABLET BY MOUTH DAILY   No facility-administered medications prior to visit.    Review of Systems  HENT:  Positive for postnasal drip.   Eyes:  Positive for itching.  Musculoskeletal:  Positive for arthralgias and neck pain.  Allergic/Immunologic: Positive for environmental allergies.    Last CBC Lab Results  Component Value Date   WBC 6.8 11/24/2021   HGB 11.3 (L) 11/24/2021   HCT 35.7 (L) 11/24/2021   MCV 87.1 11/24/2021   MCH 27.6 11/24/2021   RDW 13.2 11/24/2021   PLT 191 0000000   Last metabolic panel Lab Results  Component Value Date   GLUCOSE 159 (H) 11/24/2021   NA 139 11/24/2021   K 3.2 (L) 11/24/2021   CL 105 11/24/2021   CO2 24 11/24/2021   BUN 19 11/24/2021   CREATININE 1.01 (H) 11/24/2021   GFRNONAA >60 11/24/2021   CALCIUM 8.6 (L) 11/24/2021   PHOS 3.1 06/17/2021   PROT 7.0 08/30/2021   ALBUMIN 4.6 08/30/2021   LABGLOB 2.4 08/30/2021   AGRATIO 1.9 08/30/2021   BILITOT 0.4 08/30/2021   ALKPHOS 94 08/30/2021   AST 18 08/30/2021   ALT 16 08/30/2021   ANIONGAP 10 11/24/2021   Last lipids Lab Results  Component Value Date   CHOL 167 08/30/2021   HDL 58 08/30/2021   LDLCALC 91 08/30/2021   TRIG 99 08/30/2021   CHOLHDL 2.9 08/30/2021   Last  hemoglobin A1c Lab Results  Component Value Date   HGBA1C 6.2 (H) 08/30/2021   Last thyroid functions Lab Results  Component Value Date   TSH 1.350 12/25/2021   T4TOTAL 8.4 09/04/2016   Last vitamin D No results found for: "25OHVITD2", "25OHVITD3", "VD25OH" Last vitamin B12 and Folate No results found for: "VITAMINB12", "FOLATE"    Objective    BP 124/82 (BP Location: Left Arm, Patient Position: Sitting, Cuff Size: Large)   Pulse 83   Temp 97.9 F (36.6 C) (Temporal)   Resp 12   Ht 5\' 4"  (1.626 m)   Wt 203 lb (92.1 kg)   SpO2 99%   BMI 34.84 kg/m  BP Readings from Last 3 Encounters:  06/17/22 124/82  04/17/22 (!) 151/87  12/27/21 138/89   Wt Readings from Last 3 Encounters:  06/17/22 203 lb (92.1 kg)  06/03/22 205 lb (93 kg)  04/17/22 205 lb (93 kg)      Physical Exam Vitals reviewed.  Constitutional:      General: She is not in acute distress.    Appearance: Normal appearance. She is well-developed. She is not diaphoretic.  HENT:     Head: Normocephalic and atraumatic.     Right Ear: Tympanic membrane, ear canal and external ear normal.     Left Ear: Tympanic membrane, ear canal and external ear normal.     Nose: Nose normal.     Mouth/Throat:     Mouth: Mucous membranes are moist.     Pharynx: Oropharynx is clear. No oropharyngeal exudate.  Eyes:     General: No scleral icterus.    Conjunctiva/sclera: Conjunctivae normal.     Pupils: Pupils are equal, round, and reactive to light.  Neck:     Thyroid: No thyromegaly.  Cardiovascular:     Rate and Rhythm: Normal rate and regular rhythm.     Heart sounds: Normal heart sounds. No murmur heard. Pulmonary:     Effort: Pulmonary effort is normal. No respiratory distress.     Breath sounds: Normal  breath sounds. No wheezing or rales.  Abdominal:     General: There is no distension.     Palpations: Abdomen is soft.     Tenderness: There is no abdominal tenderness.  Musculoskeletal:        General: No  deformity.     Cervical back: Neck supple.     Right lower leg: No edema.     Left lower leg: No edema.  Lymphadenopathy:     Cervical: No cervical adenopathy.  Skin:    General: Skin is warm and dry.     Findings: No rash.  Neurological:     Mental Status: She is alert and oriented to person, place, and time. Mental status is at baseline.     Gait: Gait normal.  Psychiatric:        Mood and Affect: Mood normal.        Behavior: Behavior normal.        Thought Content: Thought content normal.       Last depression screening scores    06/17/2022   10:29 AM 06/03/2022    3:17 PM 04/17/2022   11:12 AM  PHQ 2/9 Scores  PHQ - 2 Score 0 0 0  PHQ- 9 Score 3 0 0   Last fall risk screening    06/17/2022   10:29 AM  Fall Risk   Falls in the past year? 0  Number falls in past yr: 0  Injury with Fall? 0  Risk for fall due to : No Fall Risks  Follow up Falls evaluation completed   Last Audit-C alcohol use screening    06/17/2022   10:30 AM  Alcohol Use Disorder Test (AUDIT)  1. How often do you have a drink containing alcohol? 2  2. How many drinks containing alcohol do you have on a typical day when you are drinking? 0  3. How often do you have six or more drinks on one occasion? 0  AUDIT-C Score 2   A score of 3 or more in women, and 4 or more in men indicates increased risk for alcohol abuse, EXCEPT if all of the points are from question 1   No results found for any visits on 06/17/22.  Assessment & Plan    Routine Health Maintenance and Physical Exam  Exercise Activities and Dietary recommendations  Goals   None     Immunization History  Administered Date(s) Administered   Influenza, High Dose Seasonal PF 02/13/2021   Influenza,inj,Quad PF,6+ Mos 01/05/2018, 01/05/2019, 01/19/2020   Influenza-Unspecified 01/02/2017   PFIZER(Purple Top)SARS-COV-2 Vaccination 05/22/2019, 06/12/2019, 02/06/2020   PNEUMOCOCCAL CONJUGATE-20 05/28/2021   Pfizer Covid-19 Vaccine Bivalent  Booster 66yrs & up 07/31/2020, 02/13/2021   Tdap 04/15/2010, 01/19/2020   Zoster Recombinat (Shingrix) 11/03/2017, 01/05/2018    Health Maintenance  Topic Date Due   COVID-19 Vaccine (6 - 2023-24 season) 11/15/2021   INFLUENZA VACCINE  10/16/2022   Medicare Annual Wellness (AWV)  06/03/2023   MAMMOGRAM  07/31/2023   COLONOSCOPY (Pts 45-42yrs Insurance coverage will need to be confirmed)  03/26/2024   DTaP/Tdap/Td (3 - Td or Tdap) 01/18/2030   Pneumonia Vaccine 33+ Years old  Completed   DEXA SCAN  Completed   Hepatitis C Screening  Completed   Zoster Vaccines- Shingrix  Completed   HPV VACCINES  Aged Out    Discussed health benefits of physical activity, and encouraged her to engage in regular exercise appropriate for her age and condition.  Problem List Items Addressed This Visit  Cardiovascular and Mediastinum   Essential hypertension    Well controlled Continue current medications Recheck metabolic panel F/u in 6 months       Relevant Orders   Comprehensive metabolic panel     Endocrine   Adult hypothyroidism    Previously well controlled Continue Synthroid at current dose  Recheck TSH and adjust Synthroid as indicated        Relevant Orders   TSH     Other   Hyperlipemia, mixed    Previously well controlled Continue statin Repeat FLP and CMP      Relevant Orders   Comprehensive metabolic panel   Lipid panel   Obesity    Discussed importance of healthy weight management Discussed diet and exercise       Prediabetes    Recommend low carb diet Recheck A1c       Relevant Orders   Hemoglobin A1c   Elevated LFTs   Relevant Orders   Comprehensive metabolic panel   Other Visit Diagnoses     Encounter for annual physical exam    -  Primary   Relevant Orders   CBC   Comprehensive metabolic panel   TSH   Lipid panel   Hemoglobin A1c   Breast cancer screening by mammogram       Relevant Orders   MM 3D SCREENING MAMMOGRAM BILATERAL BREAST    Anemia, unspecified type       Relevant Orders   CBC        Return in about 6 months (around 12/17/2022) for chronic disease f/u.     Angela Villarreal, Angela Paganini, MD, have reviewed all documentation for this visit. The documentation on 06/17/22 for the exam, diagnosis, procedures, and orders are all accurate and complete.   Bacigalupo, Dionne Bucy, MD, MPH [facility]

## 2022-06-17 ENCOUNTER — Encounter: Payer: Self-pay | Admitting: Family Medicine

## 2022-06-17 ENCOUNTER — Ambulatory Visit (INDEPENDENT_AMBULATORY_CARE_PROVIDER_SITE_OTHER): Payer: Medicare Other | Admitting: Family Medicine

## 2022-06-17 VITALS — BP 124/82 | HR 83 | Temp 97.9°F | Resp 12 | Ht 64.0 in | Wt 203.0 lb

## 2022-06-17 DIAGNOSIS — E782 Mixed hyperlipidemia: Secondary | ICD-10-CM | POA: Diagnosis not present

## 2022-06-17 DIAGNOSIS — I10 Essential (primary) hypertension: Secondary | ICD-10-CM

## 2022-06-17 DIAGNOSIS — R7989 Other specified abnormal findings of blood chemistry: Secondary | ICD-10-CM | POA: Diagnosis not present

## 2022-06-17 DIAGNOSIS — Z0000 Encounter for general adult medical examination without abnormal findings: Secondary | ICD-10-CM | POA: Diagnosis not present

## 2022-06-17 DIAGNOSIS — R7303 Prediabetes: Secondary | ICD-10-CM | POA: Diagnosis not present

## 2022-06-17 DIAGNOSIS — D649 Anemia, unspecified: Secondary | ICD-10-CM

## 2022-06-17 DIAGNOSIS — E669 Obesity, unspecified: Secondary | ICD-10-CM

## 2022-06-17 DIAGNOSIS — Z1231 Encounter for screening mammogram for malignant neoplasm of breast: Secondary | ICD-10-CM

## 2022-06-17 DIAGNOSIS — Z6834 Body mass index (BMI) 34.0-34.9, adult: Secondary | ICD-10-CM

## 2022-06-17 DIAGNOSIS — E039 Hypothyroidism, unspecified: Secondary | ICD-10-CM | POA: Diagnosis not present

## 2022-06-17 NOTE — Assessment & Plan Note (Signed)
Discussed importance of healthy weight management Discussed diet and exercise  

## 2022-06-17 NOTE — Assessment & Plan Note (Signed)
Recommend low carb diet °Recheck A1c  °

## 2022-06-17 NOTE — Assessment & Plan Note (Signed)
Well controlled Continue current medications Recheck metabolic panel F/u in 6 months  

## 2022-06-17 NOTE — Assessment & Plan Note (Signed)
Previously well controlled Continue statin Repeat FLP and CMP  

## 2022-06-17 NOTE — Assessment & Plan Note (Signed)
Previously well controlled Continue Synthroid at current dose  Recheck TSH and adjust Synthroid as indicated   

## 2022-06-18 LAB — COMPREHENSIVE METABOLIC PANEL
ALT: 13 IU/L (ref 0–32)
AST: 14 IU/L (ref 0–40)
Albumin/Globulin Ratio: 1.5 (ref 1.2–2.2)
Albumin: 4.4 g/dL (ref 3.9–4.9)
Alkaline Phosphatase: 88 IU/L (ref 44–121)
BUN/Creatinine Ratio: 14 (ref 12–28)
BUN: 13 mg/dL (ref 8–27)
Bilirubin Total: 0.3 mg/dL (ref 0.0–1.2)
CO2: 24 mmol/L (ref 20–29)
Calcium: 9.5 mg/dL (ref 8.7–10.3)
Chloride: 103 mmol/L (ref 96–106)
Creatinine, Ser: 0.96 mg/dL (ref 0.57–1.00)
Globulin, Total: 2.9 g/dL (ref 1.5–4.5)
Glucose: 106 mg/dL — ABNORMAL HIGH (ref 70–99)
Potassium: 3.7 mmol/L (ref 3.5–5.2)
Sodium: 142 mmol/L (ref 134–144)
Total Protein: 7.3 g/dL (ref 6.0–8.5)
eGFR: 65 mL/min/{1.73_m2} (ref >59)

## 2022-06-18 LAB — LIPID PANEL
Chol/HDL Ratio: 3.2 ratio (ref 0.0–4.4)
Cholesterol, Total: 147 mg/dL (ref 100–199)
HDL: 46 mg/dL (ref >39)
LDL Chol Calc (NIH): 68 mg/dL (ref 0–99)
Triglycerides: 202 mg/dL — ABNORMAL HIGH (ref 0–149)
VLDL Cholesterol Cal: 33 mg/dL (ref 5–40)

## 2022-06-18 LAB — CBC
Hematocrit: 39.2 % (ref 34.0–46.6)
Hemoglobin: 12.9 g/dL (ref 11.1–15.9)
MCH: 28.5 pg (ref 26.6–33.0)
MCHC: 32.9 g/dL (ref 31.5–35.7)
MCV: 87 fL (ref 79–97)
Platelets: 226 10*3/uL (ref 150–450)
RBC: 4.52 x10E6/uL (ref 3.77–5.28)
RDW: 13.6 % (ref 11.7–15.4)
WBC: 6.8 10*3/uL (ref 3.4–10.8)

## 2022-06-18 LAB — HEMOGLOBIN A1C
Est. average glucose Bld gHb Est-mCnc: 137 mg/dL
Hgb A1c MFr Bld: 6.4 % — ABNORMAL HIGH (ref 4.8–5.6)

## 2022-06-18 LAB — TSH: TSH: 0.893 u[IU]/mL (ref 0.450–4.500)

## 2022-06-30 ENCOUNTER — Other Ambulatory Visit: Payer: Self-pay | Admitting: Family Medicine

## 2022-06-30 DIAGNOSIS — E039 Hypothyroidism, unspecified: Secondary | ICD-10-CM

## 2022-07-08 ENCOUNTER — Ambulatory Visit (INDEPENDENT_AMBULATORY_CARE_PROVIDER_SITE_OTHER): Payer: Medicare Other | Admitting: Physician Assistant

## 2022-07-08 ENCOUNTER — Ambulatory Visit: Payer: Self-pay

## 2022-07-08 ENCOUNTER — Encounter: Payer: Self-pay | Admitting: Physician Assistant

## 2022-07-08 VITALS — BP 100/70 | HR 90 | Temp 98.6°F | Ht 65.0 in | Wt 204.7 lb

## 2022-07-08 DIAGNOSIS — J4541 Moderate persistent asthma with (acute) exacerbation: Secondary | ICD-10-CM | POA: Diagnosis not present

## 2022-07-08 MED ORDER — NEBULIZER MASK ADULT MISC: 0 refills | Status: DC

## 2022-07-08 MED ORDER — ALBUTEROL SULFATE HFA 108 (90 BASE) MCG/ACT IN AERS: 1.0000 | INHALATION_SPRAY | RESPIRATORY_TRACT | 3 refills | Status: AC | PRN

## 2022-07-08 MED ORDER — NEBULIZER MISC: 0 refills | Status: AC

## 2022-07-08 MED ORDER — ALBUTEROL SULFATE (2.5 MG/3ML) 0.083% IN NEBU: 2.5000 mg | INHALATION_SOLUTION | Freq: Four times a day (QID) | RESPIRATORY_TRACT | 1 refills | Status: AC | PRN

## 2022-07-08 MED ORDER — PREDNISONE 20 MG PO TABS: 20.0000 mg | ORAL_TABLET | Freq: Every day | ORAL | 0 refills | Status: DC

## 2022-07-08 NOTE — Progress Notes (Signed)
I,Angela Villarreal,acting as a Neurosurgeon for [employer], PA-C.,have documented all relevant documentation on the behalf of Angela Ferguson, PA-C,as directed by  Angela Ferguson, PA-C while in the presence of Angela Ferguson, PA-C.   Established patient visit   Patient: Angela Villarreal   DOB: [birth year]   67 y.o. Female  MRN: [id number] Visit Date: 07/08/2022  Today's healthcare provider: [name], PA-C   Cc. Asthma flare  Subjective    Cough This is a recurrent problem. The current episode started in the past 7 days. The problem has been gradually worsening. The problem occurs constantly. The cough is Productive of sputum. Associated symptoms include myalgias, postnasal drip and wheezing. The symptoms are aggravated by pollens, lying down, fumes and dust. Risk factors for lung disease include travel. She has tried ipratropium inhaler (coricidin) for the symptoms. The treatment provided no relief. Her past medical history is significant for asthma.   Rescue inhaler is expired but has been helping. Patient reports using it twice a day   She is consistent with claritin and singular as well as her symbicort.   Medications: Outpatient Medications Prior to Visit  Medication Sig   budesonide-formoterol (SYMBICORT) 160-4.5 MCG/ACT inhaler Inhale 2 puffs into the lungs 2 (two) times daily.   diltiazem (CARDIZEM CD) 120 MG 24 hr capsule TAKE 1 CAPSULE (120 MG) BY MOUTH EVERY DAY   fluticasone (FLONASE) 50 MCG/ACT nasal spray 2 SPRAYS IN EACH NOSTRIL EVERY DAY   hydrocortisone 2.5 % cream Apply topically 2 (two) times daily as needed (Rash).   hyoscyamine (LEVBID) 0.375 MG 12 hr tablet TAKE ONE TABLET BY MOUTH TWICE DAILY (Patient taking differently: as needed.)   loratadine (CLARITIN) 10 MG tablet Take 10 mg by mouth daily.   montelukast (SINGULAIR) 10 MG tablet Take 1 tablet (10 mg total) by mouth at bedtime.   omeprazole (PRILOSEC) 40 MG capsule Take 40 mg by mouth daily.    potassium chloride SA (KLOR-CON M) 20 MEQ tablet TAKE ONE TABLET TWICE DAILY   rosuvastatin (CRESTOR) 5 MG tablet TAKE 1 TABLET BY MOUTH DAILY   SYNTHROID 125 MCG tablet TAKE ONE TABLET BY MOUTH BEFORE BREAKFAST   triamterene-hydrochlorothiazide (MAXZIDE-25) 37.5-25 MG tablet TAKE 1 TABLET BY MOUTH DAILY   [DISCONTINUED] albuterol (VENTOLIN HFA) 108 (90 Base) MCG/ACT inhaler Inhale 1-2 puffs into the lungs every 4 (four) hours as needed for wheezing or shortness of breath.   No facility-administered medications prior to visit.    Review of Systems  HENT:  Positive for postnasal drip.   Respiratory:  Positive for cough and wheezing.   Musculoskeletal:  Positive for myalgias.     Objective    BP 100/70 (BP Location: Right Arm, Patient Position: Sitting, Cuff Size: Large)   Pulse 90   Temp 98.6 F (37 C) (Oral)   Ht  (1.651 m)   Wt 204 lb 11.2 oz (92.9 kg)   SpO2 98%   BMI 34.06 kg/m    Physical Exam Vitals reviewed.  Constitutional:      Appearance: She is not ill-appearing.  HENT:     Head: Normocephalic.  Eyes:     Conjunctiva/sclera: Conjunctivae normal.  Cardiovascular:     Rate and Rhythm: Normal rate.  Pulmonary:     Effort: Pulmonary effort is normal. No respiratory distress.     Breath sounds: Wheezing present.  Neurological:     General: No focal deficit present.     Mental Status: She is alert and oriented  to person, place, and time.  Psychiatric:        Mood and Affect: Mood normal.        Behavior: Behavior normal.      No results found for any visits on 07/08/22.  Assessment & Plan     1. Moderate persistent asthma with acute exacerbation Refilled albuterol inhaler as hers was expired Rx prednisone 20 mg x 5 days   Advised she have a nebulizer for prn exacerbations. Q 4-6 hours as needed. Her exacerbations are infrequent but severe  - albuterol (VENTOLIN HFA) 108 (90 Base) MCG/ACT inhaler; Inhale 1-2 puffs into the lungs every 4 (four) hours  as needed for wheezing or shortness of breath.  Dispense: 3 each; Refill: 3 - predniSONE (DELTASONE) 20 MG tablet; Take 1 tablet (20 mg total) by mouth daily with breakfast.  Dispense: 5 tablet; Refill: 0 - albuterol (PROVENTIL) (2.5 MG/3ML) 0.083% nebulizer solution; Take 3 mLs (2.5 mg total) by nebulization every 6 (six) hours as needed for wheezing or shortness of breath.  Dispense: 150 mL; Refill: 1 - Nebulizer MISC; Use with nebulized liquid q 4-6 hours prn  Dispense: 1 each; Refill: 0 - Respiratory Therapy Supplies (NEBULIZER MASK ADULT) MISC; Use with nebulizer machine and nebulized medication q 4-6 hours prn  Dispense: 1 each; Refill: 0   Return if symptoms worsen or fail to improve.      I, Angela Ferguson, PA-C have reviewed all documentation for this visit. The documentation on  07/08/22 for the exam, diagnosis, procedures, and orders are all accurate and complete.  Angela Ferguson, PA-C [facility] [address] [place], [place], [address] Office: [phone] Fax: [phone]   [facility]

## 2022-07-08 NOTE — Telephone Encounter (Signed)
     Chief Complaint: Productive cough with wheezing. Using inhaler, Coricidin. Head congestion. History of asthma. Symptoms: Above Frequency: 5 days ago Pertinent Negatives: Patient denies fever Disposition: ED /[] Urgent Care (no appt availability in office) / Appointment(In office/virtual)/  [facility]/ Home Care/ Refused Recommended Disposition /[]  Mobile Bus/  Follow-up with PCP Additional Notes:   Reason for Disposition  [1] Continuous (nonstop) coughing interferes with work or school AND [2] no improvement using cough treatment per Care Advice  Answer Assessment - Initial Assessment Questions 1. ONSET: "When did the cough begin?"      5 days ago 2. SEVERITY: "How bad is the cough today?"      Severe 3. SPUTUM: "Describe the color of your sputum" (none, dry cough; clear, white, yellow, green)     Dark grey 4. HEMOPTYSIS: "Are you coughing up any blood?" If so ask: "How much?" (flecks, streaks, tablespoons, etc.)     No 5. DIFFICULTY BREATHING: "Are you having difficulty breathing?" If Yes, ask: "How bad is it?" (e.g., mild, moderate, severe)    - MILD: No SOB at rest, mild SOB with walking, speaks normally in sentences, can lie down, no retractions, pulse < 100.    - MODERATE: SOB at rest, SOB with minimal exertion and prefers to sit, cannot lie down flat, speaks in phrases, mild retractions, audible wheezing, pulse 100-120.    - SEVERE: Very SOB at rest, speaks in single words, struggling to breathe, sitting hunched forward, retractions, pulse > 120      No 6. FEVER: "Do you have a fever?" If Yes, ask: "What is your temperature, how was it measured, and when did it start?"     No 7. CARDIAC HISTORY: "Do you have any history of heart disease?" (e.g., heart attack, congestive heart failure)      No 8. LUNG HISTORY: "Do you have any history of lung disease?"  (e.g., pulmonary embolus, asthma, emphysema)     Asthma 9. PE RISK FACTORS: "Do you  have a history of blood clots?" (or: recent major surgery, recent prolonged travel, bedridden)     No 10. OTHER SYMPTOMS: "Do you have any other symptoms?" (e.g., runny nose, wheezing, chest pain)       Wheezing 11. PREGNANCY: "Is there any chance you are pregnant?" "When was your last menstrual period?"       No 12. TRAVEL: "Have you traveled out of the country in the last month?" (e.g., travel history, exposures)       No  Protocols used: Cough - Acute Productive-A-AH

## 2022-07-14 ENCOUNTER — Ambulatory Visit (INDEPENDENT_AMBULATORY_CARE_PROVIDER_SITE_OTHER): Payer: Medicare Other | Admitting: Family Medicine

## 2022-07-14 ENCOUNTER — Encounter: Payer: Self-pay | Admitting: Family Medicine

## 2022-07-14 VITALS — BP 137/81 | HR 81 | Temp 98.1°F | Resp 16 | Wt 210.0 lb

## 2022-07-14 DIAGNOSIS — J0140 Acute pansinusitis, unspecified: Secondary | ICD-10-CM | POA: Diagnosis not present

## 2022-07-14 DIAGNOSIS — J4541 Moderate persistent asthma with (acute) exacerbation: Secondary | ICD-10-CM

## 2022-07-14 MED ORDER — DOXYCYCLINE HYCLATE 100 MG PO TABS: 100.0000 mg | ORAL_TABLET | Freq: Two times a day (BID) | ORAL | 0 refills | Status: AC

## 2022-07-14 NOTE — Progress Notes (Signed)
Acute Office Visit  Subjective:     Patient ID: Angela Villarreal, female    DOB: March 07, 1956, 67 y.o.   MRN: [id number]  Chief Complaint  Patient presents with   URI    HPI Patient is in today for 10 day history of nasal discharge, wheezing, and productive cough. Was seen in clinic last week for these symptoms and was determined to have asthma exacerbation at that time. Treated with prednisone and a nebulizer. Cough has improved, but is productive with small amounts of brown sputum. Nasal discharge has worsened and gone from clear and thin to yellow and thick. Discharge not improved with intranasal saline and allergy medications. Noted bilateral upper tooth/jaw pain on Thursday night.      Review of Systems  Constitutional:  Negative for chills and fever.  HENT:  Positive for congestion, sinus pain and sore throat. Negative for ear discharge, ear pain and hearing loss.   Eyes:  Negative for blurred vision and double vision.  Respiratory:  Positive for cough, sputum production and wheezing.   Cardiovascular:  Negative for chest pain.  Gastrointestinal:  Negative for abdominal pain.  Neurological:  Negative for sensory change.        Objective:    BP 137/81 (BP Location: Left Arm, Patient Position: Sitting, Cuff Size: Large)   Pulse 81   Temp 98.1 F (36.7 C) (Temporal)   Resp 16   Wt 210 lb (95.3 kg)   SpO2 98%   BMI 34.95 kg/m    Physical Exam Constitutional:      General: She is not in acute distress. HENT:     Head: Normocephalic and atraumatic. No right periorbital erythema or left periorbital erythema.     Nose: No congestion or rhinorrhea.     Right Turbinates: Enlarged. Not pale.     Left Turbinates: Enlarged. Not pale.     Right Sinus: Maxillary sinus tenderness present.     Left Sinus: Maxillary sinus tenderness present.     Comments: Sinus tenderness greater on the left side.  Turbinates erythematous     Mouth/Throat:     Mouth: Mucous membranes are  moist.     Pharynx: Oropharynx is clear. No oropharyngeal exudate.  Eyes:     General: Lids are normal.     Conjunctiva/sclera: Conjunctivae normal.  Cardiovascular:     Pulses: Normal pulses.     Heart sounds: Normal heart sounds.  Pulmonary:     Effort: Pulmonary effort is normal.     Breath sounds: Wheezing present.  Neurological:     Mental Status: She is alert.     No results found for any visits on 07/14/22.      Assessment & Plan:   Problem List Items Addressed This Visit     Airway hyperreactivity    Followed by pulmonology.  Chronic and stable at today's visit. If it worsens restart prednisone.         Other Visit Diagnoses     Acute non-recurrent pansinusitis    -  Primary   Symptoms, duration and exam findings most consistent with acute bacterial pansinusitis. Meds ordered this encounter  Medications   doxycycline (VIBRA-TABS) 100 MG tablet    Sig: Take 1 tablet (100 mg total) by mouth 2 (two) times daily for 7 days.    Dispense:  14 tablet    Refill:  0       - symptoms and exam c/w sinusitis   - no evidence of AOM, CAP,  strep pharyngitis, or other infection - given duration of symptoms, suspect bacterial etiology - will treat with Doxycycline x7d - discussed symptomatic management (flonase, decongestants, etc), natural course, and return precautions      No follow-ups on file.  Patient advised to return if symptoms worsen or fail to improve.   Gilmer Mor, Medical Student   Patient seen along with MS3 student Ezekiel Slocumb. I personally evaluated this patient along with the student, and verified all aspects of the history, physical exam, and medical decision making as documented by the student. I agree with the student's documentation and have made all necessary edits.  Jama Mcmiller, Marzella Schlein, MD, MPH [facility]

## 2022-07-14 NOTE — Assessment & Plan Note (Signed)
Chronic. 

## 2022-08-01 ENCOUNTER — Ambulatory Visit
Admission: RE | Admit: 2022-08-01 | Discharge: 2022-08-01 | Disposition: A | Discharge status: Home / Self Care | Payer: Medicare Other | Source: Ambulatory Visit | Attending: Family Medicine | Admitting: Family Medicine

## 2022-08-01 DIAGNOSIS — Z1231 Encounter for screening mammogram for malignant neoplasm of breast: Secondary | ICD-10-CM | POA: Insufficient documentation

## 2022-08-13 ENCOUNTER — Other Ambulatory Visit: Payer: Self-pay | Admitting: Family Medicine

## 2022-08-13 DIAGNOSIS — E039 Hypothyroidism, unspecified: Secondary | ICD-10-CM

## 2022-08-13 DIAGNOSIS — I10 Essential (primary) hypertension: Secondary | ICD-10-CM

## 2022-08-18 ENCOUNTER — Telehealth: Payer: Self-pay

## 2022-08-18 NOTE — Telephone Encounter (Signed)
Copied from CRM [id number]. Topic: Appointment Scheduling - Scheduling Inquiry for Clinic >> Aug 18, 2022  9:55 AM Epimenio Foot F wrote: Reason for CRM: Pt is calling in because she was told by Dr. Beryle Flock that if her left hand continued to bother her she could have a cortisone shot. Pt is calling back to schedule the shot.

## 2022-08-18 NOTE — Telephone Encounter (Signed)
I think it was arthritis, for which ortho would consider doing a shot.  I don't remember her having a trigger finger - that's what I do shots in the hand for. So, if its a trigger finger, you can use a same day appt slot. If it's arthritis pain,. We can refer to ortho.

## 2022-08-21 ENCOUNTER — Ambulatory Visit (INDEPENDENT_AMBULATORY_CARE_PROVIDER_SITE_OTHER): Payer: Medicare Other | Admitting: Family Medicine

## 2022-08-21 ENCOUNTER — Encounter: Payer: Self-pay | Admitting: Family Medicine

## 2022-08-21 VITALS — BP 143/89 | HR 87 | Temp 98.3°F | Wt 211.0 lb

## 2022-08-21 DIAGNOSIS — M65342 Trigger finger, left ring finger: Secondary | ICD-10-CM | POA: Diagnosis not present

## 2022-08-21 DIAGNOSIS — M19049 Primary osteoarthritis, unspecified hand: Secondary | ICD-10-CM

## 2022-08-21 MED ORDER — METHYLPREDNISOLONE ACETATE 40 MG/ML IJ SUSP
20.0000 mg | Freq: Once | INTRAMUSCULAR | Status: AC
  Administered 2022-08-21: 20 mg via INTRAMUSCULAR

## 2022-08-21 MED ORDER — LIDOCAINE HCL 2 % IJ SOLN
0.5000 mL | Freq: Once | INTRAMUSCULAR | Status: AC
  Administered 2022-08-21: 10 mg

## 2022-08-21 NOTE — Addendum Note (Signed)
Addended by: Janey Greaser D on: 08/21/2022 12:08 PM   Modules accepted: Orders

## 2022-08-21 NOTE — Progress Notes (Signed)
Established patient visit   Patient: Angela Villarreal   DOB: 25-Aug-1955   67 y.o. Female  MRN: [id number] Visit Date: 08/21/2022  Today's healthcare provider: Shirlee Latch, MD   Chief Complaint  Patient presents with   trigger finger   Subjective    HPI  Patient is a 67 year old female who presents with complaint of trigger finger for about 2 months and is requesting steroid injection.  It is her left third finger.  She states she has a cyst in that hand for at least 6 months.  She also complains of left thumb pain that started this week.  Discussed the use of AI scribe software for clinical note transcription with the patient, who gave verbal consent to proceed.  History of Present Illness   The patient presents with a trigger finger in the left hand, specifically the fourth finger, which has been progressively worsening over the past few weeks. They report waking up almost every morning with the finger stuck in a bent position. They also report pain at the base of the thumb, which they initially thought might be related to the trigger finger. The patient has been using CBD cream for pain relief.  In addition to the trigger finger, the patient has been experiencing significant pain at the base of the thumb, which they describe as 'killing me.' They have been using CBD cream for pain relief, but the pain persists. The patient uses their hands frequently for various tasks, which may contribute to the pain.   R hand dominant       Medications: Outpatient Medications Prior to Visit  Medication Sig   albuterol (PROVENTIL) (2.5 MG/3ML) 0.083% nebulizer solution Take 3 mLs (2.5 mg total) by nebulization every 6 (six) hours as needed for wheezing or shortness of breath.   albuterol (VENTOLIN HFA) 108 (90 Base) MCG/ACT inhaler Inhale 1-2 puffs into the lungs every 4 (four) hours as needed for wheezing or shortness of breath.   budesonide-formoterol (SYMBICORT) 160-4.5 MCG/ACT  inhaler Inhale 2 puffs into the lungs 2 (two) times daily.   diltiazem (CARDIZEM CD) 120 MG 24 hr capsule TAKE 1 CAPSULE (120 MG) BY MOUTH EVERY DAY   fluticasone (FLONASE) 50 MCG/ACT nasal spray 2 SPRAYS IN EACH NOSTRIL EVERY DAY   hydrocortisone 2.5 % cream Apply topically 2 (two) times daily as needed (Rash).   hyoscyamine (LEVBID) 0.375 MG 12 hr tablet TAKE ONE TABLET BY MOUTH TWICE DAILY (Patient taking differently: as needed.)   loratadine (CLARITIN) 10 MG tablet Take 10 mg by mouth daily.   montelukast (SINGULAIR) 10 MG tablet Take 1 tablet (10 mg total) by mouth at bedtime.   Nebulizer MISC Use with nebulized liquid q 4-6 hours prn   omeprazole (PRILOSEC) 40 MG capsule Take 40 mg by mouth daily.   potassium chloride SA (KLOR-CON M) 20 MEQ tablet TAKE ONE TABLET TWICE DAILY   rosuvastatin (CRESTOR) 5 MG tablet TAKE 1 TABLET BY MOUTH DAILY   SYNTHROID 125 MCG tablet TAKE ONE TABLET EACH MORNING BEFORE BREAKFAST   triamterene-hydrochlorothiazide (MAXZIDE-25) 37.5-25 MG tablet TAKE 1 TABLET BY MOUTH DAILY   [DISCONTINUED] predniSONE (DELTASONE) 20 MG tablet Take 1 tablet (20 mg total) by mouth daily with breakfast.   [DISCONTINUED] Respiratory Therapy Supplies (NEBULIZER MASK ADULT) MISC Use with nebulizer machine and nebulized medication q 4-6 hours prn   No facility-administered medications prior to visit.    Review of Systems per HPI     Objective  BP (!) 143/89 (BP Location: Left Arm, Patient Position: Sitting, Cuff Size: Large)   Pulse 87   Temp 98.3 F (36.8 C) (Oral)   Wt 211 lb (95.7 kg)   SpO2 98%   BMI 35.11 kg/m  Vitals:   08/21/22 1015 08/21/22 1018  BP: (!) 146/93 (!) 143/89  Pulse: 87   Temp: 98.3 F (36.8 C)   TempSrc: Oral   SpO2: 98%   Weight: 211 lb (95.7 kg)      Physical Exam   Physical Exam   MUSCULOSKELETAL: Tenderness at base of thumb, consistent with arthritis. Palpable nodule on L fourth finger, consistent with trigger finger.         No results found for any visits on 08/21/22.  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 Discussed the benefits and risks of corticosteroid injection. -Administered corticosteroid injection into the nodule at the A1 pulley. -Advised to gently massage the area tonight.  Thumb Arthritis (CMC joint) (left hand): Pain at the base of the thumb.  -Recommended Voltaren gel for pain relief. -Consider referral to a hand specialist if symptoms persist.      INJECTION: Patient was given informed consent,. Appropriate time out was taken. Area prepped and draped in usual sterile fashion. 0.5 cc of depo-medrol 40 mg/ml plus  0.5 cc of 2% lidocaine without epinephrine was injected into the L palm at A1 pulley. The patient tolerated the procedure well. There were no complications. Post procedure instructions were given.     Return if symptoms worsen or fail to improve.      I, Shirlee Latch, MD, have reviewed all documentation for this visit. The documentation on 08/21/22 for the exam, diagnosis, procedures, and orders are all accurate and complete.   Mattison Stuckey, Marzella Schlein, MD, MPH [facility]

## 2022-10-02 ENCOUNTER — Ambulatory Visit: Payer: Medicare PPO | Admitting: Dermatology

## 2022-10-13 ENCOUNTER — Encounter: Payer: Self-pay | Admitting: Dermatology

## 2022-10-13 ENCOUNTER — Ambulatory Visit (INDEPENDENT_AMBULATORY_CARE_PROVIDER_SITE_OTHER): Payer: Medicare Other | Admitting: Dermatology

## 2022-10-13 VITALS — BP 128/73 | HR 91

## 2022-10-13 DIAGNOSIS — L820 Inflamed seborrheic keratosis: Secondary | ICD-10-CM

## 2022-10-13 DIAGNOSIS — I8393 Asymptomatic varicose veins of bilateral lower extremities: Secondary | ICD-10-CM

## 2022-10-13 DIAGNOSIS — L821 Other seborrheic keratosis: Secondary | ICD-10-CM

## 2022-10-13 DIAGNOSIS — Z85820 Personal history of malignant melanoma of skin: Secondary | ICD-10-CM

## 2022-10-13 DIAGNOSIS — L578 Other skin changes due to chronic exposure to nonionizing radiation: Secondary | ICD-10-CM | POA: Diagnosis not present

## 2022-10-13 DIAGNOSIS — L814 Other melanin hyperpigmentation: Secondary | ICD-10-CM | POA: Diagnosis not present

## 2022-10-13 DIAGNOSIS — W908XXA Exposure to other nonionizing radiation, initial encounter: Secondary | ICD-10-CM

## 2022-10-13 DIAGNOSIS — D229 Melanocytic nevi, unspecified: Secondary | ICD-10-CM

## 2022-10-13 DIAGNOSIS — L304 Erythema intertrigo: Secondary | ICD-10-CM

## 2022-10-13 DIAGNOSIS — Z86006 Personal history of melanoma in-situ: Secondary | ICD-10-CM

## 2022-10-13 DIAGNOSIS — Z7189 Other specified counseling: Secondary | ICD-10-CM

## 2022-10-13 DIAGNOSIS — Z79899 Other long term (current) drug therapy: Secondary | ICD-10-CM

## 2022-10-13 DIAGNOSIS — L918 Other hypertrophic disorders of the skin: Secondary | ICD-10-CM

## 2022-10-13 DIAGNOSIS — D1801 Hemangioma of skin and subcutaneous tissue: Secondary | ICD-10-CM

## 2022-10-13 DIAGNOSIS — Z1283 Encounter for screening for malignant neoplasm of skin: Secondary | ICD-10-CM

## 2022-10-13 DIAGNOSIS — I781 Nevus, non-neoplastic: Secondary | ICD-10-CM

## 2022-10-13 NOTE — Progress Notes (Signed)
Follow-Up Visit   Subjective  Angela Villarreal is a 67 y.o. female who presents for the following: Skin Cancer Screening and Full Body Skin Exam The patient presents for Total-Body Skin Exam (TBSE) for skin cancer screening and mole check. The patient has spots, moles and lesions to be evaluated, some may be new or changing and the patient may have concern these could be cancer.  The following portions of the chart were reviewed this encounter and updated as appropriate: medications, allergies, medical history  Review of Systems:  No other skin or systemic complaints except as noted in HPI or Assessment and Plan.  Objective  Well appearing patient in no apparent distress; mood and affect are within normal limits.  A full examination was performed including scalp, head, eyes, ears, nose, lips, neck, chest, axillae, abdomen, back, buttocks, bilateral upper extremities, bilateral lower extremities, hands, feet, fingers, toes, fingernails, and toenails. All findings within normal limits unless otherwise noted below.   Relevant physical exam findings are noted in the Assessment and Plan.  L clavicle x 1, R upper arm x 1, chest x 2 (4) Erythematous stuck-on, waxy papule or plaque   Assessment & Plan   SKIN CANCER SCREENING PERFORMED TODAY.  ACTINIC DAMAGE - Chronic condition, secondary to cumulative UV/sun exposure - diffuse scaly erythematous macules with underlying dyspigmentation - Recommend daily broad spectrum sunscreen SPF 30+ to sun-exposed areas, reapply every 2 hours as needed.  - Staying in the shade or wearing long sleeves, sun glasses (UVA+UVB protection) and wide brim hats (4-inch brim around the entire circumference of the hat) are also recommended for sun protection.  - Call for new or changing lesions.  LENTIGINES, SEBORRHEIC KERATOSES, HEMANGIOMAS - Benign normal skin lesions - Benign-appearing - Call for any changes  MELANOCYTIC NEVI - Tan-brown and/or  pink-flesh-colored symmetric macules and papules - Benign appearing on exam today - Observation - Call clinic for new or changing moles - Recommend daily use of broad spectrum spf 30+ sunscreen to sun-exposed areas.   Inflamed seborrheic keratosis (4) L clavicle x 1, R upper arm x 1, chest x 2  Symptomatic, irritating, patient would like treated.   Destruction of lesion - L clavicle x 1, R upper arm x 1, chest x 2 (4) Complexity: simple   Destruction method: cryotherapy   Informed consent: discussed and consent obtained   Timeout:  patient name, date of birth, surgical site, and procedure verified Lesion destroyed using liquid nitrogen: Yes   Region frozen until ice ball extended beyond lesion: Yes   Outcome: patient tolerated procedure well with no complications   Post-procedure details: wound care instructions given    INTERTRIGO Exam Erythematous macerated patches Chronic condition with duration or expected duration over one year. Currently well-controlled. Intertrigo is a chronic recurrent rash that occurs in skin fold areas that may be associated with friction; heat; moisture; yeast; fungus; and bacteria.  It is exacerbated by increased movement / activity; sweating; and higher atmospheric temperature.  Treatment Plan Continue Ketoconazole 2% cream QD PRN and HC 2.5% cream every day up to 5d/wk.  OK to continue OTC Monkey Butt paste for prevention.   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  - Call if any new or changing lesions are noted between office visits  Acrochordons (  Skin Tags) - Fleshy, skin-colored pedunculated papules - Benign appearing.  - Observe. - If desired, they can be removed with an in office procedure that is not covered by insurance. - Please call the  clinic if you notice any new or changing lesions.   Varicose Veins/Spider Veins - Dilated blue, purple or red veins at the lower extremities - Reassured - Smaller vessels can be treated by sclerotherapy (a procedure to inject a medicine into the veins to make them disappear) if desired, but the treatment is not covered by insurance. Larger vessels may be covered if symptomatic and we would refer to vascular surgeon if treatment desired.  Return in about 1 year (around 10/13/2023) for TBSE.  Maylene Roes, CMA, am acting as scribe for Armida Sans, MD .  Documentation: I have reviewed the above documentation for accuracy and completeness, and I agree with the above.  Armida Sans, MD

## 2022-10-13 NOTE — Patient Instructions (Signed)

## 2022-10-15 ENCOUNTER — Telehealth: Payer: Self-pay | Admitting: *Deleted

## 2022-10-15 ENCOUNTER — Encounter: Payer: Self-pay | Admitting: *Deleted

## 2022-10-15 NOTE — Patient Outreach (Signed)
  Care Coordination   Initial Visit Note   10/15/2022 Name: Angela Villarreal MRN: [id number] DOB: [birth year]  Angela Villarreal is a 67 y.o. year old female who sees Bacigalupo, Marzella Schlein, MD for primary care. I spoke with  [name] by phone today.  What matters to the patients health and wellness today?  Keep health well maintained.  Does not feel follow up is needed at this time, but will call with questions.     Goals Addressed             This Visit's Progress    COMPLETED: Care Coordination Activities       Interventions Today    Flowsheet Row Most Recent Value  Chronic Disease   Chronic disease during today's visit Hypertension (HTN)  General Interventions   General Interventions Discussed/Reviewed General Interventions Reviewed, Doctor Visits, Durable Medical Equipment (DME)  Doctor Visits Discussed/Reviewed Doctor Visits Reviewed, PCP, Annual Wellness Visits  [PCP in October, [place] in March 2025]  Durable Medical Equipment (DME) BP Cuff  PCP/Specialist Visits Compliance with follow-up visit  Education Interventions   Education Provided Provided Education  Provided Verbal Education On Nutrition, Medication, When to see the doctor  Nutrition Interventions   Nutrition Discussed/Reviewed Nutrition Reviewed, Adding fruits and vegetables, Decreasing salt, Portion sizes              SDOH assessments and interventions completed:  Yes  SDOH Interventions Today    Flowsheet Row Most Recent Value  SDOH Interventions   Food Insecurity Interventions Intervention Not Indicated  Housing Interventions Intervention Not Indicated  Transportation Interventions Intervention Not Indicated        Care Coordination Interventions:  Yes, provided   Follow up plan: No further intervention required.   Encounter Outcome:  Pt. Visit Completed   Kemper Durie, RN, MSN, [facility] Care Management Care Management Coordinator [phone]

## 2022-10-28 NOTE — Progress Notes (Unsigned)
Established patient visit  Patient: Angela Villarreal   DOB: January 07, 1956   67 y.o. Female  MRN: [id number] Visit Date: 10/29/2022  Today's healthcare provider: Debera Lat, PA-C   No chief complaint on file.  Subjective    HPI  *** Discussed the use of AI scribe software for clinical note transcription with the patient, who gave verbal consent to proceed.  History of Present Illness               08/21/2022   10:17 AM 07/08/2022   11:20 AM 06/17/2022   10:29 AM  Depression screen PHQ 2/9  Decreased Interest 0 0 0  Down, Depressed, Hopeless 0 0 0  PHQ - 2 Score 0 0 0  Altered sleeping 0 0 1  Tired, decreased energy 0 0 1  Change in appetite 0 0 1  Feeling bad or failure about yourself  0 0 0  Trouble concentrating 0 0 0  Moving slowly or fidgety/restless 0 0 0  Suicidal thoughts 0 0 0  PHQ-9 Score 0 0 3  Difficult doing work/chores Not difficult at all Not difficult at all Not difficult at all      08/30/2021    9:47 AM 02/15/2021    9:59 AM 08/09/2020    3:33 PM  GAD 7 : Generalized Anxiety Score  Nervous, Anxious, on Edge 0 1 0  Control/stop worrying 0 0 0  Worry too much - different things 0 0 0  Trouble relaxing 0 0 0  Restless 0 0 0  Easily annoyed or irritable 0 0 0  Afraid - awful might happen 0 0 0  Total GAD 7 Score 0 1 0  Anxiety Difficulty Not difficult at all Not difficult at all Not difficult at all    Medications: Outpatient Medications Prior to Visit  Medication Sig   albuterol (PROVENTIL) (2.5 MG/3ML) 0.083% nebulizer solution Take 3 mLs (2.5 mg total) by nebulization every 6 (six) hours as needed for wheezing or shortness of breath.   albuterol (VENTOLIN HFA) 108 (90 Base) MCG/ACT inhaler Inhale 1-2 puffs into the lungs every 4 (four) hours as needed for wheezing or shortness of breath.   budesonide-formoterol (SYMBICORT) 160-4.5 MCG/ACT inhaler Inhale 2 puffs into the lungs 2 (two) times daily.   diltiazem (CARDIZEM CD) 120 MG 24 hr capsule TAKE  1 CAPSULE (120 MG) BY MOUTH EVERY DAY   fluticasone (FLONASE) 50 MCG/ACT nasal spray 2 SPRAYS IN EACH NOSTRIL EVERY DAY   hydrocortisone 2.5 % cream Apply topically 2 (two) times daily as needed (Rash).   hyoscyamine (LEVBID) 0.375 MG 12 hr tablet TAKE ONE TABLET BY MOUTH TWICE DAILY (Patient taking differently: as needed.)   loratadine (CLARITIN) 10 MG tablet Take 10 mg by mouth daily.   montelukast (SINGULAIR) 10 MG tablet Take 1 tablet (10 mg total) by mouth at bedtime.   Nebulizer MISC Use with nebulized liquid q 4-6 hours prn   omeprazole (PRILOSEC) 40 MG capsule Take 40 mg by mouth daily.   potassium chloride SA (KLOR-CON M) 20 MEQ tablet TAKE ONE TABLET TWICE DAILY   rosuvastatin (CRESTOR) 5 MG tablet TAKE 1 TABLET BY MOUTH DAILY   SYNTHROID 125 MCG tablet TAKE ONE TABLET EACH MORNING BEFORE BREAKFAST   triamterene-hydrochlorothiazide (MAXZIDE-25) 37.5-25 MG tablet TAKE 1 TABLET BY MOUTH DAILY   No facility-administered medications prior to visit.    Review of Systems Except see HPI   {Insert previous labs (optional):23779} {See past labs  Heme  Chem  Endocrine  Serology  Results Review (optional):1}   Objective    There were no vitals taken for this visit. {Insert last BP/Wt (optional):23777}{See vitals history (optional):1}   Physical Exam   No results found for any visits on 10/29/22.  Assessment & Plan    *** Assessment and Plan              No follow-ups on file.      [facility]

## 2022-10-29 ENCOUNTER — Ambulatory Visit (INDEPENDENT_AMBULATORY_CARE_PROVIDER_SITE_OTHER): Payer: Medicare Other | Admitting: Physician Assistant

## 2022-10-29 ENCOUNTER — Encounter: Payer: Self-pay | Admitting: Physician Assistant

## 2022-10-29 VITALS — BP 150/84 | HR 93 | Temp 98.1°F | Resp 12 | Ht 65.0 in | Wt 206.7 lb

## 2022-10-29 DIAGNOSIS — J029 Acute pharyngitis, unspecified: Secondary | ICD-10-CM | POA: Diagnosis not present

## 2022-10-29 DIAGNOSIS — J301 Allergic rhinitis due to pollen: Secondary | ICD-10-CM

## 2022-10-29 LAB — POCT RAPID STREP A (OFFICE): Rapid Strep A Screen: POSITIVE — AB

## 2022-10-29 MED ORDER — AZITHROMYCIN 250 MG PO TABS: ORAL_TABLET | ORAL | 0 refills | Status: AC

## 2022-12-02 DIAGNOSIS — K219 Gastro-esophageal reflux disease without esophagitis: Secondary | ICD-10-CM | POA: Diagnosis not present

## 2022-12-18 ENCOUNTER — Ambulatory Visit: Payer: BC Managed Care – PPO | Admitting: Family Medicine

## 2022-12-19 ENCOUNTER — Telehealth: Payer: Self-pay | Admitting: Family Medicine

## 2022-12-19 DIAGNOSIS — E039 Hypothyroidism, unspecified: Secondary | ICD-10-CM

## 2022-12-19 NOTE — Telephone Encounter (Signed)
Pt is calling to report that she has an appt 01/05/23 with Dr. Leonard Schwartz. In the past, when an adjustment is needed on her SYNTHROID 125 MCG tablet [[id number]]  her nails began to have ridges, and fatigue. Pt reports similar sx again. Wanting to know can Dr. B place orders for lab work prior to appt on 01/05/23. Cb- [phone]

## 2022-12-19 NOTE — Telephone Encounter (Signed)
Ok to go ahead and get TSH prior to appt.

## 2022-12-19 NOTE — Telephone Encounter (Signed)
Patient advised.

## 2022-12-22 ENCOUNTER — Other Ambulatory Visit: Payer: Self-pay | Admitting: Family Medicine

## 2022-12-22 DIAGNOSIS — E876 Hypokalemia: Secondary | ICD-10-CM

## 2022-12-25 ENCOUNTER — Other Ambulatory Visit: Payer: Self-pay

## 2022-12-25 DIAGNOSIS — E039 Hypothyroidism, unspecified: Secondary | ICD-10-CM

## 2022-12-26 LAB — TSH: TSH: 2.52 u[IU]/mL (ref 0.450–4.500)

## 2023-01-05 ENCOUNTER — Encounter: Payer: Self-pay | Admitting: Family Medicine

## 2023-01-05 ENCOUNTER — Ambulatory Visit (INDEPENDENT_AMBULATORY_CARE_PROVIDER_SITE_OTHER): Payer: Medicare Other | Admitting: Family Medicine

## 2023-01-05 VITALS — BP 138/84 | HR 84 | Temp 99.0°F | Ht 65.0 in | Wt 210.0 lb

## 2023-01-05 DIAGNOSIS — I10 Essential (primary) hypertension: Secondary | ICD-10-CM

## 2023-01-05 DIAGNOSIS — Z23 Encounter for immunization: Secondary | ICD-10-CM | POA: Diagnosis not present

## 2023-01-05 DIAGNOSIS — E782 Mixed hyperlipidemia: Secondary | ICD-10-CM

## 2023-01-05 DIAGNOSIS — R7303 Prediabetes: Secondary | ICD-10-CM | POA: Diagnosis not present

## 2023-01-05 DIAGNOSIS — E039 Hypothyroidism, unspecified: Secondary | ICD-10-CM | POA: Diagnosis not present

## 2023-01-05 DIAGNOSIS — Z6834 Body mass index (BMI) 34.0-34.9, adult: Secondary | ICD-10-CM

## 2023-01-05 DIAGNOSIS — E66811 Obesity, class 1: Secondary | ICD-10-CM

## 2023-01-05 NOTE — Assessment & Plan Note (Signed)
Stable on Synthroid daily. Recent thyroid function tests within normal range. -Continue Synthroid daily.

## 2023-01-05 NOTE — Assessment & Plan Note (Signed)
Stable on Crestor 5mg . Last cholesterol check was within normal range. -Continue Crestor 5mg  daily. -Recheck cholesterol at annual physical.

## 2023-01-05 NOTE — Assessment & Plan Note (Signed)
Discussed importance of healthy weight management Discussed diet and exercise  

## 2023-01-05 NOTE — Assessment & Plan Note (Signed)
Well controlled Continue current medications Recheck metabolic panel F/u in 6 months  

## 2023-01-05 NOTE — Progress Notes (Signed)
Established Patient Office Visit  Subjective   Patient ID: Angela Villarreal, female    DOB: 1955-08-07  Age: 67 y.o. MRN: [id number]  Chief Complaint  Patient presents with   Follow-up    HPI  Discussed the use of AI scribe software for clinical note transcription with the patient, who gave verbal consent to proceed.  History of Present Illness   The patient, with a history of prediabetes, hypertension, and joint pain, presents with concerns about her thyroid function and worsening joint pain. Angela Villarreal reports feeling tired and sluggish, which Angela Villarreal initially attributed to potential thyroid dysfunction. However, thyroid function tests revealed normal levels, leading her to consider other factors such as stress and aging. Angela Villarreal also reports worsening joint pain, particularly in her knee, which occasionally becomes so severe that it disrupts her sleep. Angela Villarreal has tried various remedies, including over-the-counter pain relievers and topical treatments, with limited success. Angela Villarreal has previously received a steroid shot for her joint pain, which provided significant relief for several years.  The patient also discusses her prediabetes, acknowledging that her A1c levels are high and expressing a desire to avoid progression to diabetes. Angela Villarreal has made some lifestyle changes, including dietary modifications such as cutting out beef, and is working on reducing her sugar and carbohydrate intake. Angela Villarreal acknowledges the need for further improvements in her lifestyle to better manage her prediabetes.         ROS    Objective:     BP 138/84 (BP Location: Left Arm, Patient Position: Sitting)   Pulse 84   Temp 99 F (37.2 C) (Oral)   Ht 5\' 5"  (1.651 m)   Wt 210 lb (95.3 kg)   SpO2 97% Comment: room air  BMI 34.95 kg/m    Physical Exam Vitals reviewed.  Constitutional:      General: Angela Villarreal is not in acute distress.    Appearance: Normal appearance. Angela Villarreal is well-developed. Angela Villarreal is not diaphoretic.  HENT:      Head: Normocephalic and atraumatic.  Eyes:     General: No scleral icterus.    Conjunctiva/sclera: Conjunctivae normal.  Neck:     Thyroid: No thyromegaly.  Cardiovascular:     Rate and Rhythm: Normal rate and regular rhythm.     Heart sounds: Normal heart sounds. No murmur heard. Pulmonary:     Effort: Pulmonary effort is normal. No respiratory distress.     Breath sounds: Normal breath sounds. No wheezing, rhonchi or rales.  Musculoskeletal:     Cervical back: Neck supple.     Right lower leg: No edema.     Left lower leg: No edema.  Lymphadenopathy:     Cervical: No cervical adenopathy.  Skin:    General: Skin is warm and dry.     Findings: No rash.  Neurological:     Mental Status: Angela Villarreal is alert and oriented to person, place, and time. Mental status is at baseline.  Psychiatric:        Mood and Affect: Mood normal.        Behavior: Behavior normal.      No results found for any visits on 01/05/23.    The ASCVD Risk score (Arnett DK, et al., 2019) failed to calculate for the following reasons:   The patient has a prior MI or stroke diagnosis    Assessment & Plan:   Problem List Items Addressed This Visit       Cardiovascular and Mediastinum   Essential hypertension - Primary  Well controlled Continue current medications Recheck metabolic panel F/u in 6 months       Relevant Orders   Comprehensive metabolic panel     Endocrine   Adult hypothyroidism    Stable on Synthroid daily. Recent thyroid function tests within normal range. -Continue Synthroid daily.        Other   Hyperlipemia, mixed    Stable on Crestor 5mg . Last cholesterol check was within normal range. -Continue Crestor 5mg  daily. -Recheck cholesterol at annual physical.      Obesity    Discussed importance of healthy weight management Discussed diet and exercise       Prediabetes    Last A1C was 6.4, close to diabetes threshold. Discussed importance of lifestyle  changes including diet and exercise. -Continue lifestyle modifications. -Recheck A1C, kidney, and liver function.      Relevant Orders   Hemoglobin A1c   Other Visit Diagnoses     Needs flu shot       Relevant Orders   Flu Vaccine Trivalent High Dose (Fluad) (Completed)           Arthritis Reports of intermittent severe knee pain. Previous successful treatment with Kenalog injection. -Consider steroid injection if pain becomes persistent.  General Health Maintenance -Received flu shot. -Received COVID-19 booster shot. -Schedule physical in six months.        Return in about 6 months (around 07/06/2023) for CPE.    Shirlee Latch, MD

## 2023-01-05 NOTE — Assessment & Plan Note (Signed)
Last A1C was 6.4, close to diabetes threshold. Discussed importance of lifestyle changes including diet and exercise. -Continue lifestyle modifications. -Recheck A1C, kidney, and liver function.

## 2023-01-06 ENCOUNTER — Other Ambulatory Visit: Payer: Self-pay

## 2023-01-06 DIAGNOSIS — N289 Disorder of kidney and ureter, unspecified: Secondary | ICD-10-CM

## 2023-01-06 LAB — COMPREHENSIVE METABOLIC PANEL
ALT: 16 [IU]/L (ref 0–32)
AST: 17 [IU]/L (ref 0–40)
Albumin: 4.2 g/dL (ref 3.9–4.9)
Alkaline Phosphatase: 100 [IU]/L (ref 44–121)
BUN/Creatinine Ratio: 13 (ref 12–28)
BUN: 17 mg/dL (ref 8–27)
Bilirubin Total: 0.3 mg/dL (ref 0.0–1.2)
CO2: 23 mmol/L (ref 20–29)
Calcium: 9.3 mg/dL (ref 8.7–10.3)
Chloride: 102 mmol/L (ref 96–106)
Creatinine, Ser: 1.31 mg/dL — ABNORMAL HIGH (ref 0.57–1.00)
Globulin, Total: 2.9 g/dL (ref 1.5–4.5)
Glucose: 114 mg/dL — ABNORMAL HIGH (ref 70–99)
Potassium: 3.6 mmol/L (ref 3.5–5.2)
Sodium: 140 mmol/L (ref 134–144)
Total Protein: 7.1 g/dL (ref 6.0–8.5)
eGFR: 45 mL/min/{1.73_m2} — ABNORMAL LOW (ref >59)

## 2023-01-06 LAB — HEMOGLOBIN A1C
Est. average glucose Bld gHb Est-mCnc: 143 mg/dL
Hgb A1c MFr Bld: 6.6 % — ABNORMAL HIGH (ref 4.8–5.6)

## 2023-01-20 ENCOUNTER — Other Ambulatory Visit: Payer: Self-pay | Admitting: Family Medicine

## 2023-01-20 DIAGNOSIS — N289 Disorder of kidney and ureter, unspecified: Secondary | ICD-10-CM | POA: Diagnosis not present

## 2023-01-21 LAB — COMPREHENSIVE METABOLIC PANEL
ALT: 15 [IU]/L (ref 0–32)
AST: 19 [IU]/L (ref 0–40)
Albumin: 4.4 g/dL (ref 3.9–4.9)
Alkaline Phosphatase: 81 [IU]/L (ref 44–121)
BUN/Creatinine Ratio: 20 (ref 12–28)
BUN: 19 mg/dL (ref 8–27)
Bilirubin Total: 0.4 mg/dL (ref 0.0–1.2)
CO2: 21 mmol/L (ref 20–29)
Calcium: 9.6 mg/dL (ref 8.7–10.3)
Chloride: 101 mmol/L (ref 96–106)
Creatinine, Ser: 0.96 mg/dL (ref 0.57–1.00)
Globulin, Total: 2.7 g/dL (ref 1.5–4.5)
Glucose: 107 mg/dL — ABNORMAL HIGH (ref 70–99)
Potassium: 4 mmol/L (ref 3.5–5.2)
Sodium: 142 mmol/L (ref 134–144)
Total Protein: 7.1 g/dL (ref 6.0–8.5)
eGFR: 65 mL/min/{1.73_m2} (ref >59)

## 2023-01-30 ENCOUNTER — Telehealth: Payer: Medicare Other | Admitting: Physician Assistant

## 2023-01-30 DIAGNOSIS — R3989 Other symptoms and signs involving the genitourinary system: Secondary | ICD-10-CM

## 2023-01-30 MED ORDER — SULFAMETHOXAZOLE-TRIMETHOPRIM 800-160 MG PO TABS
1.0000 | ORAL_TABLET | Freq: Two times a day (BID) | ORAL | 0 refills | Status: DC
Start: 2023-01-30 — End: 2023-03-16

## 2023-01-30 NOTE — Patient Instructions (Signed)
[name], thank you for joining Margaretann Loveless, PA-C for today's virtual visit.  While this provider is not your primary care provider (PCP), if your PCP is located in our provider database this encounter information will be shared with them immediately following your visit.   A [facility] MyChart account gives you access to today's visit and all your visits, tests, and labs performed at [facility] " click here if you don't have a [facility] MyChart account or go to [email or web address]  Consent: (Patient) Angela Villarreal provided verbal consent for this virtual visit at the beginning of the encounter.  Current Medications:  Current Outpatient Medications:    sulfamethoxazole-trimethoprim (BACTRIM DS) 800-160 MG tablet, Take 1 tablet by mouth 2 (two) times daily., Disp: 10 tablet, Rfl: 0   albuterol (PROVENTIL) (2.5 MG/3ML) 0.083% nebulizer solution, Take 3 mLs (2.5 mg total) by nebulization every 6 (six) hours as needed for wheezing or shortness of breath., Disp: 150 mL, Rfl: 1   albuterol (VENTOLIN HFA) 108 (90 Base) MCG/ACT inhaler, Inhale 1-2 puffs into the lungs every 4 (four) hours as needed for wheezing or shortness of breath., Disp: 3 each, Rfl: 3   budesonide-formoterol (SYMBICORT) 160-4.5 MCG/ACT inhaler, Inhale 2 puffs into the lungs 2 (two) times daily., Disp: , Rfl:    diltiazem (CARDIZEM CD) 120 MG 24 hr capsule, TAKE 1 CAPSULE (120 MG) BY MOUTH EVERY DAY, Disp: 90 capsule, Rfl: 1   fluticasone (FLONASE) 50 MCG/ACT nasal spray, 2 SPRAYS IN EACH NOSTRIL EVERY DAY, Disp: 16 g, Rfl: 6   hyoscyamine (LEVBID) 0.375 MG 12 hr tablet, TAKE ONE TABLET BY MOUTH TWICE DAILY (Patient taking differently: as needed.), Disp: 60 tablet, Rfl: 3   loratadine (CLARITIN) 10 MG tablet, Take 10 mg by mouth daily., Disp: , Rfl:    montelukast (SINGULAIR) 10 MG tablet, Take 1 tablet (10 mg total) by mouth at bedtime., Disp: 90 tablet, Rfl: 1   Nebulizer MISC, Use with  nebulized liquid q 4-6 hours prn, Disp: 1 each, Rfl: 0   omeprazole (PRILOSEC) 40 MG capsule, Take 40 mg by mouth daily., Disp: , Rfl:    potassium chloride SA (KLOR-CON M) 20 MEQ tablet, TAKE ONE TABLET TWICE DAILY, Disp: 180 tablet, Rfl: 2   rosuvastatin (CRESTOR) 5 MG tablet, TAKE 1 TABLET BY MOUTH DAILY, Disp: 90 tablet, Rfl: 1   SYNTHROID 125 MCG tablet, TAKE ONE TABLET EACH MORNING BEFORE BREAKFAST, Disp: 90 tablet, Rfl: 1   triamterene-hydrochlorothiazide (MAXZIDE-25) 37.5-25 MG tablet, TAKE 1 TABLET BY MOUTH DAILY, Disp: 90 tablet, Rfl: 1   Medications ordered in this encounter:  Meds ordered this encounter  Medications   sulfamethoxazole-trimethoprim (BACTRIM DS) 800-160 MG tablet    Sig: Take 1 tablet by mouth 2 (two) times daily.    Dispense:  10 tablet    Refill:  0    Order Specific Question:   Supervising Provider    Answer:   Merrilee Jansky [id number]     *If you need refills on other medications prior to your next appointment, please contact your pharmacy*  Follow-Up: Call back or seek an in-person evaluation if the symptoms worsen or if the condition fails to improve as anticipated.  [facility] [phone]  Other Instructions Urinary Tract Infection, Adult  A urinary tract infection (UTI) is an infection of any part of the urinary tract. The urinary tract includes the kidneys, ureters, bladder, and urethra. These organs make, store, and get rid of  urine in the body. An upper UTI affects the ureters and kidneys. A lower UTI affects the bladder and urethra. What are the causes? Most urinary tract infections are caused by bacteria in your genital area around your urethra, where urine leaves your body. These bacteria grow and cause inflammation of your urinary tract. What increases the risk? You are more likely to develop this condition if: You have a urinary catheter that stays in place. You are not able to control when you urinate or have a bowel  movement (incontinence). You are female and you: Use a spermicide or diaphragm for birth control. Have low estrogen levels. Are pregnant. You have certain genes that increase your risk. You are sexually active. You take antibiotic medicines. You have a condition that causes your flow of urine to slow down, such as: An enlarged prostate, if you are female. Blockage in your urethra. A kidney stone. A nerve condition that affects your bladder control (neurogenic bladder). Not getting enough to drink, or not urinating often. You have certain medical conditions, such as: Diabetes. A weak disease-fighting system (immunesystem). Sickle cell disease. Gout. Spinal cord injury. What are the signs or symptoms? Symptoms of this condition include: Needing to urinate right away (urgency). Frequent urination. This may include small amounts of urine each time you urinate. Pain or burning with urination. Blood in the urine. Urine that smells bad or unusual. Trouble urinating. Cloudy urine. Vaginal discharge, if you are female. Pain in the abdomen or the lower back. You may also have: Vomiting or a decreased appetite. Confusion. Irritability or tiredness. A fever or chills. Diarrhea. The first symptom in older adults may be confusion. In some cases, they may not have any symptoms until the infection has worsened. How is this diagnosed? This condition is diagnosed based on your medical history and a physical exam. You may also have other tests, including: Urine tests. Blood tests. Tests for STIs (sexually transmitted infections). If you have had more than one UTI, a cystoscopy or imaging studies may be done to determine the cause of the infections. How is this treated? Treatment for this condition includes: Antibiotic medicine. Over-the-counter medicines to treat discomfort. Drinking enough water to stay hydrated. If you have frequent infections or have other conditions such as a kidney  stone, you may need to see a health care provider who specializes in the urinary tract (urologist). In rare cases, urinary tract infections can cause sepsis. Sepsis is a life-threatening condition that occurs when the body responds to an infection. Sepsis is treated in the hospital with IV antibiotics, fluids, and other medicines. Follow these instructions at home:  Medicines Take over-the-counter and prescription medicines only as told by your health care provider. If you were prescribed an antibiotic medicine, take it as told by your health care provider. Do not stop using the antibiotic even if you start to feel better. General instructions Make sure you: Empty your bladder often and completely. Do not hold urine for long periods of time. Empty your bladder after sex. Wipe from front to back after urinating or having a bowel movement if you are female. Use each tissue only one time when you wipe. Drink enough fluid to keep your urine pale yellow. Keep all follow-up visits. This is important. Contact a health care provider if: Your symptoms do not get better after 1-2 days. Your symptoms go away and then return. Get help right away if: You have severe pain in your back or your lower abdomen. You have  a fever or chills. You have nausea or vomiting. Summary A urinary tract infection (UTI) is an infection of any part of the urinary tract, which includes the kidneys, ureters, bladder, and urethra. Most urinary tract infections are caused by bacteria in your genital area. Treatment for this condition often includes antibiotic medicines. If you were prescribed an antibiotic medicine, take it as told by your health care provider. Do not stop using the antibiotic even if you start to feel better. Keep all follow-up visits. This is important. This information is not intended to replace advice given to you by your health care provider. Make sure you discuss any questions you have with your health  care provider. Document Revised: 10/09/2019 Document Reviewed: 10/14/2019 Elsevier Patient Education  2024 Elsevier Inc.    If you have been instructed to have an in-person evaluation today at a local Urgent Care facility, please use the link below. It will take you to a list of all of our available [facility], including address, phone number and hours of operation. Please do not delay care.  [facility]  If you or a family member do not have a primary care provider, use the link below to schedule a visit and establish care. When you choose a [facility] primary care physician or advanced practice provider, you gain a long-term partner in health. Find a Primary Care Provider  Learn more about [facility]'s in-office and virtual care options: [facility] - Get Care Now

## 2023-01-30 NOTE — Progress Notes (Signed)
Virtual Visit Consent   NUHA VALDIVIESO, you are scheduled for a virtual visit with a [facility] provider today. Just as with appointments in the office, your consent must be obtained to participate. Your consent will be active for this visit and any virtual visit you may have with one of our providers in the next 365 days. If you have a MyChart account, a copy of this consent can be sent to you electronically.  As this is a virtual visit, video technology does not allow for your provider to perform a traditional examination. This may limit your provider's ability to fully assess your condition. If your provider identifies any concerns that need to be evaluated in person or the need to arrange testing (such as labs, EKG, etc.), we will make arrangements to do so. Although advances in technology are sophisticated, we cannot ensure that it will always work on either your end or our end. If the connection with a video visit is poor, the visit may have to be switched to a telephone visit. With either a video or telephone visit, we are not always able to ensure that we have a secure connection.  By engaging in this virtual visit, you consent to the provision of healthcare and authorize for your insurance to be billed (if applicable) for the services provided during this visit. Depending on your insurance coverage, you may receive a charge related to this service.  I need to obtain your verbal consent now. Are you willing to proceed with your visit today? Angela Villarreal has provided verbal consent on 01/30/2023 for a virtual visit (video or telephone). Margaretann Loveless, PA-C  Date: 01/30/2023 4:07 PM  Virtual Visit via Video Note   I, Margaretann Loveless, connected with  Angela Villarreal  ([id number], [birth year]) on 01/30/23 at  4:00 PM EST by a video-enabled telemedicine application and verified that I am speaking with the correct person using two identifiers.  Location: Patient: Virtual Visit  Location Patient: Home Provider: Virtual Visit Location Provider: Home Office   I discussed the limitations of evaluation and management by telemedicine and the availability of in person appointments. The patient expressed understanding and agreed to proceed.    History of Present Illness: Angela Villarreal is a 67 y.o. who identifies as a female who was assigned female at birth, and is being seen today for possible UTI, dysuria.  HPI: Urinary Tract Infection  This is a new problem. The current episode started today. The problem occurs every urination. The problem has been gradually worsening. The quality of the pain is described as aching and burning. The pain is mild. There has been no fever. Associated symptoms include frequency, hesitancy and urgency. Pertinent negatives include no chills, discharge, flank pain, hematuria, nausea, sweats or vomiting. Associated symptoms comments: Felt a little off walking around, darker urine, suprapubic pain. She has tried increased fluids (AZO) for the symptoms. The treatment provided no relief. There is no history of recurrent UTIs.     Problems:  Patient Active Problem List   Diagnosis Date Noted   Elevated LFTs 08/30/2021   De Quervain's tenosynovitis, left 08/30/2021   Choledocholithiasis 06/17/2021   Anxiety 08/09/2020   Sprain of anterior talofibular ligament of left ankle 07/14/2019   Prediabetes 07/14/2019   Venous insufficiency of both lower extremities 01/05/2019   History of adenomatous polyp of colon 12/01/2017   Varicose veins of bilateral lower extremities with pain 09/04/2016   History of malignant melanoma 09/04/2016  IBS (irritable bowel syndrome) 09/04/2015   Hypokalemia 03/05/2015   Post-menopausal 11/22/2014   Allergic rhinitis 08/17/2014   Airway hyperreactivity 08/17/2014   Family history of cardiomyopathy 08/17/2014   Essential hypertension 08/17/2014   Adult hypothyroidism 08/17/2014   Obesity 08/17/2014   Arthritis,  degenerative 08/17/2014   Cardiac murmur 07/18/2014   Hyperlipemia, mixed 07/17/2014    Allergies:  Allergies  Allergen Reactions   Amoxicillin Hives    Other reaction(s): Rash, Hives () Other reaction(s): Rash, Hives () Other reaction(s): Rash, Hives () Other reaction(s): Rash, Hives () Other reaction(s): Rash, Hives () Other reaction(s): Rash, Hives () Other reaction(s): Rash, Hives ()    Gentamicin Sulfate    Lisinopril Cough   Medications:  Current Outpatient Medications:    albuterol (PROVENTIL) (2.5 MG/3ML) 0.083% nebulizer solution, Take 3 mLs (2.5 mg total) by nebulization every 6 (six) hours as needed for wheezing or shortness of breath., Disp: 150 mL, Rfl: 1   albuterol (VENTOLIN HFA) 108 (90 Base) MCG/ACT inhaler, Inhale 1-2 puffs into the lungs every 4 (four) hours as needed for wheezing or shortness of breath., Disp: 3 each, Rfl: 3   budesonide-formoterol (SYMBICORT) 160-4.5 MCG/ACT inhaler, Inhale 2 puffs into the lungs 2 (two) times daily., Disp: , Rfl:    diltiazem (CARDIZEM CD) 120 MG 24 hr capsule, TAKE 1 CAPSULE (120 MG) BY MOUTH EVERY DAY, Disp: 90 capsule, Rfl: 1   fluticasone (FLONASE) 50 MCG/ACT nasal spray, 2 SPRAYS IN EACH NOSTRIL EVERY DAY, Disp: 16 g, Rfl: 6   hyoscyamine (LEVBID) 0.375 MG 12 hr tablet, TAKE ONE TABLET BY MOUTH TWICE DAILY (Patient taking differently: as needed.), Disp: 60 tablet, Rfl: 3   loratadine (CLARITIN) 10 MG tablet, Take 10 mg by mouth daily., Disp: , Rfl:    montelukast (SINGULAIR) 10 MG tablet, Take 1 tablet (10 mg total) by mouth at bedtime., Disp: 90 tablet, Rfl: 1   Nebulizer MISC, Use with nebulized liquid q 4-6 hours prn, Disp: 1 each, Rfl: 0   omeprazole (PRILOSEC) 40 MG capsule, Take 40 mg by mouth daily., Disp: , Rfl:    potassium chloride SA (KLOR-CON M) 20 MEQ tablet, TAKE ONE TABLET TWICE DAILY, Disp: 180 tablet, Rfl: 2   rosuvastatin (CRESTOR) 5 MG tablet, TAKE 1 TABLET BY MOUTH DAILY, Disp: 90 tablet, Rfl: 1    sulfamethoxazole-trimethoprim (BACTRIM DS) 800-160 MG tablet, Take 1 tablet by mouth 2 (two) times daily., Disp: 10 tablet, Rfl: 0   SYNTHROID 125 MCG tablet, TAKE ONE TABLET EACH MORNING BEFORE BREAKFAST, Disp: 90 tablet, Rfl: 1   triamterene-hydrochlorothiazide (MAXZIDE-25) 37.5-25 MG tablet, TAKE 1 TABLET BY MOUTH DAILY, Disp: 90 tablet, Rfl: 1  Observations/Objective: Patient is well-developed, well-nourished in no acute distress.  Resting comfortably at home.  Head is normocephalic, atraumatic.  No labored breathing.  Speech is clear and coherent with logical content.  Patient is alert and oriented at baseline.    Assessment and Plan: 1. Suspected UTI - sulfamethoxazole-trimethoprim (BACTRIM DS) 800-160 MG tablet; Take 1 tablet by mouth 2 (two) times daily.  Dispense: 10 tablet; Refill: 0  - Worsening symptoms.  - Will treat empirically with Bactrim - May use AZO for bladder spasms - Continue to push fluids.  - Seek in person evaluation for urine culture if symptoms do not improve or if they worsen.    Follow Up Instructions: I discussed the assessment and treatment plan with the patient. The patient was provided an opportunity to ask questions and all were answered. The  patient agreed with the plan and demonstrated an understanding of the instructions.  A copy of instructions were sent to the patient via MyChart unless otherwise noted below.    The patient was advised to call back or seek an in-person evaluation if the symptoms worsen or if the condition fails to improve as anticipated.    Margaretann Loveless, PA-C

## 2023-02-15 ENCOUNTER — Other Ambulatory Visit: Payer: Self-pay | Admitting: Family Medicine

## 2023-02-15 DIAGNOSIS — I10 Essential (primary) hypertension: Secondary | ICD-10-CM

## 2023-02-15 DIAGNOSIS — E039 Hypothyroidism, unspecified: Secondary | ICD-10-CM

## 2023-03-03 ENCOUNTER — Ambulatory Visit: Payer: Medicare Other | Admitting: Family Medicine

## 2023-03-16 ENCOUNTER — Ambulatory Visit (INDEPENDENT_AMBULATORY_CARE_PROVIDER_SITE_OTHER): Payer: Medicare Other | Admitting: Family Medicine

## 2023-03-16 VITALS — BP 140/92 | HR 82 | Ht 65.0 in | Wt 206.2 lb

## 2023-03-16 DIAGNOSIS — E1159 Type 2 diabetes mellitus with other circulatory complications: Secondary | ICD-10-CM

## 2023-03-16 DIAGNOSIS — E785 Hyperlipidemia, unspecified: Secondary | ICD-10-CM

## 2023-03-16 DIAGNOSIS — I152 Hypertension secondary to endocrine disorders: Secondary | ICD-10-CM

## 2023-03-16 DIAGNOSIS — E1169 Type 2 diabetes mellitus with other specified complication: Secondary | ICD-10-CM

## 2023-03-16 MED ORDER — METFORMIN HCL 500 MG PO TABS: 500.0000 mg | ORAL_TABLET | Freq: Two times a day (BID) | ORAL | 3 refills | Status: DC

## 2023-03-17 ENCOUNTER — Encounter: Payer: Self-pay | Admitting: Family Medicine

## 2023-03-17 DIAGNOSIS — E1169 Type 2 diabetes mellitus with other specified complication: Secondary | ICD-10-CM | POA: Diagnosis not present

## 2023-03-17 DIAGNOSIS — E119 Type 2 diabetes mellitus without complications: Secondary | ICD-10-CM

## 2023-03-17 HISTORY — DX: Type 2 diabetes mellitus without complications: E11.9

## 2023-03-17 NOTE — Assessment & Plan Note (Signed)
 New diagnosis with A1c of 6.6%, progressed from prediabetes. Discussed management to prevent complications (vascular, retinopathy, neuropathy, nephropathy). Emphasized regular screenings and lifestyle modifications. Discussed metformin , Jardiance, and GLP-1 agonists. Patient prefers metformin  due to concerns about GI side effects and lifestyle changes. - Start metformin  500 mg twice daily with food - Order urine test for protein level - Schedule follow-up in one month to reassess A1c and kidney function - Encourage eye exam and inform ophthalmologist about diabetes diagnosis - Advise daily foot monitoring for abnormalities - Encourage increased water intake

## 2023-03-17 NOTE — Assessment & Plan Note (Signed)
 Blood pressure elevated at 156/92 mmHg, likely due to stress and diet. Previous reading in October was 138/84 mmHg. Patient not monitoring at home due to anxiety. - Recheck blood pressure at the end of the visit - improved - continue current meds - Reassess blood pressure management at next month's follow-up

## 2023-03-17 NOTE — Assessment & Plan Note (Signed)
Stable on Crestor 5mg . Last cholesterol check was within normal range. -Continue Crestor 5mg  daily. -Recheck cholesterol at annual physical.

## 2023-03-19 LAB — MICROALBUMIN / CREATININE URINE RATIO
Creatinine, Urine: 20.7 mg/dL
Microalb/Creat Ratio: <14 mg/g{creat} (ref 0–29)
Microalbumin, Urine: <3 ug/mL

## 2023-03-19 LAB — SPECIMEN STATUS REPORT

## 2023-03-25 ENCOUNTER — Telehealth: Payer: Medicare Other | Admitting: Physician Assistant

## 2023-03-25 DIAGNOSIS — J069 Acute upper respiratory infection, unspecified: Secondary | ICD-10-CM

## 2023-03-25 DIAGNOSIS — J4541 Moderate persistent asthma with (acute) exacerbation: Secondary | ICD-10-CM | POA: Diagnosis not present

## 2023-03-25 NOTE — Progress Notes (Signed)
 Virtual Visit via Video Note  I connected with Angela Villarreal on 03/25/23 at 10:20 AM EST by a video enabled telemedicine application and verified that I am speaking with the correct person using two identifiers.  Today's Provider: [place], MHS, PA-C Introduced myself to the patient as a PA-C and provided education on APPs in clinical practice.   Location: Patient: at home  Provider: [facility] [place], [place], [place]    I discussed the limitations of evaluation and management by telemedicine and the availability of in person appointments. The patient expressed understanding and agreed to proceed.   Chief Complaint  Patient presents with   Sinusitis    Feels like it's upper respiratory- having sinus pressure   Nasal Congestion    History of Present Illness:  Discussed the use of AI scribe software for clinical note transcription with the patient, who gave verbal consent to proceed.  History of Present Illness   The patient, with a history of asthma, presents with a rapid onset of symptoms suggestive of a viral upper respiratory infection. They report severe sinus pain, described as the worst they've ever experienced, which was somewhat relieved by applying a hot towel to the face. The pain was severe enough to disrupt sleep, with the patient noting that sitting upright seemed to alleviate the discomfort. Accompanying symptoms include a deep, 'nasty sounding' cough, chest pain, and wheezing, which prompted the use of an albuterol  nebulizer treatment. The patient also reports a low-grade fever of 99.28F, but denies experiencing chills.  The onset of symptoms was preceded by a similar illness in their partner, who presented with a severe headache, cough, and congestion. The patient's symptoms began with body aches and a postnasal drip, progressing to a cough by the evening. By the following day, the patient was 'fully sick.' The patient has been managing symptoms with  Coricidin HBP and nebulizer treatments, and has been adhering to a regimen of Symbicort (two puffs daily) for their asthma. They also report using Flonase  daily.  The patient tested negative for COVID-19, although they note the test was past its expiration date. They have been maintaining hydration and plan to increase the use of their Symbicort inhaler to two puffs twice daily.         Review of Systems  Constitutional:  Positive for fever (subjective (99.8 max)) and malaise/fatigue. Negative for chills.  HENT:  Positive for congestion and sinus pain.   Respiratory:  Positive for cough, shortness of breath and wheezing.   Musculoskeletal:  Positive for myalgias.  Neurological:  Positive for headaches.    Observations/Objective:  Due to the nature of the virtual visit, physical exam and observations are limited. Able to obtain the following observations:   Alert, oriented, x3 Appears comfortable, in no acute distress.  No scleral injection, no appreciated hoarseness, tachypnea, wheeze or strider. Able to maintain conversation without visible strain.  No cough appreciated during visit.   Assessment and Plan:   Problem List Items Addressed This Visit       Respiratory   Airway hyperreactivity   Other Visit Diagnoses       Viral upper respiratory tract infection    -  Primary     Viral Upper Respiratory Infection (URI) Acute onset of symptoms starting Monday with body aches, postnasal drip, and cough. Symptoms progressed to severe sinus pain, chest pain, and a deep cough. Fever of 99.28F noted. Likely viral etiology given rapid onset and similar symptoms in partner. Differential includes  secondary bacterial infection, which needs monitoring. Discussed symptomatic management and avoiding decongestants due to hypertension. Informed about typical duration of viral illnesses (3-10 days) and need for reevaluation if symptoms worsen or do not improve in 5-7 days. - Continue  symptomatic management  - Use regular Mucinex , Robitussin, and Tylenol  - Continue daily Flonase  - Increase Symbicort to two puffs twice a day - Monitor symptoms and seek reevaluation if not improved in 5-7 days or if symptoms worsen (e.g., increased dyspnea, more congestion, productive cough) - Consider in-office visit if symptoms worsen to check lung status  Asthma Asthma exacerbated by URI. Currently using Symbicort two puffs daily. Recent wheezing and chest pain noted, requiring albuterol  nebulizer treatment. Discussed increasing Symbicort dosage during illness and monitoring for signs of worsening asthma or secondary bacterial infection. - Increase Symbicort to two puffs twice a day during current illness - Continue albuterol  nebulizer treatments as needed - Monitor for signs of worsening asthma or secondary bacterial infection  General Health Maintenance Discussed need for updated COVID-19 tests and importance of using non-expired tests. Informed about extended expiration dates for some tests. - Obtain fresh COVID-19 tests  Follow-up - Follow up in 5-7 days if symptoms do not improve - Seek immediate care if experiencing significant dyspnea or other severe symptoms.   Follow Up Instructions:    I discussed the assessment and treatment plan with the patient. The patient was provided an opportunity to ask questions and all were answered. The patient agreed with the plan and demonstrated an understanding of the instructions.   The patient was advised to call back or seek an in-person evaluation if the symptoms worsen or if the condition fails to improve as anticipated.  I provided 15 minutes of non-face-to-face time during this encounter.  No follow-ups on file.   I, Takiyah Bohnsack E Christiona Siddique, PA-C, have reviewed all documentation for this visit. The documentation on 03/25/23 for the exam, diagnosis, procedures, and orders are all accurate and complete.   [place], MHS, PA-C [facility]

## 2023-03-25 NOTE — Patient Instructions (Signed)

## 2023-03-30 ENCOUNTER — Ambulatory Visit (INDEPENDENT_AMBULATORY_CARE_PROVIDER_SITE_OTHER): Payer: Medicare Other | Admitting: Family Medicine

## 2023-03-30 ENCOUNTER — Encounter: Payer: Self-pay | Admitting: Family Medicine

## 2023-03-30 VITALS — BP 111/69 | HR 105 | Temp 98.4°F | Ht 65.0 in | Wt 199.2 lb

## 2023-03-30 DIAGNOSIS — J157 Pneumonia due to Mycoplasma pneumoniae: Secondary | ICD-10-CM

## 2023-03-30 DIAGNOSIS — J4541 Moderate persistent asthma with (acute) exacerbation: Secondary | ICD-10-CM

## 2023-03-30 MED ORDER — AZITHROMYCIN 250 MG PO TABS: ORAL_TABLET | ORAL | 0 refills | Status: DC

## 2023-03-30 MED ORDER — GUAIFENESIN-CODEINE 100-10 MG/5ML PO SOLN: 10.0000 mL | Freq: Three times a day (TID) | ORAL | 0 refills | Status: AC | PRN

## 2023-03-30 MED ORDER — PREDNISONE 20 MG PO TABS: 40.0000 mg | ORAL_TABLET | Freq: Every day | ORAL | 0 refills | Status: AC

## 2023-03-30 NOTE — Progress Notes (Signed)
 Acute visit   Patient: Angela Villarreal   DOB: 04/04/1955   68 y.o. Female  MRN: [id number]  No chief complaint on file.  Subjective    Discussed the use of AI scribe software for clinical note transcription with the patient, who gave verbal consent to proceed.  History of Present Illness   The patient, with a history of asthma, presents with a worsening upper respiratory illness now in its seventh day. The illness is characterized by wheezing, coughing, and the production of dark brownish-yellow phlegm. The patient reports that the symptoms have been fluctuating, with periods of seeming improvement followed by worsening. The patient has been managing the symptoms with a nebulizer, Mucinex , and an old prescription of prednisone . The patient also reports new onset of diarrhea, fatigue, weakness, and unsteadiness. The patient has recently started metformin  for an unspecified condition and is unsure if the diarrhea is a side effect of the medication or a symptom of the current illness.       Review of Systems    Objective    BP 111/69 (BP Location: Left Arm, Patient Position: Sitting, Cuff Size: Normal)   Pulse (!) 105   Temp 98.4 F (36.9 C) (Oral)   Ht 5' 5 (1.651 m)   Wt 199 lb 3.2 oz (90.4 kg)   SpO2 97%   BMI 33.15 kg/m  Physical Exam Vitals reviewed.  Constitutional:      General: She is not in acute distress.    Appearance: Normal appearance. She is well-developed. She is not diaphoretic.  HENT:     Head: Normocephalic and atraumatic.     Right Ear: Tympanic membrane, ear canal and external ear normal.     Left Ear: Tympanic membrane, ear canal and external ear normal.     Nose: Nose normal.     Mouth/Throat:     Mouth: Mucous membranes are moist.     Pharynx: Oropharynx is clear. No oropharyngeal exudate.  Eyes:     General: No scleral icterus.    Conjunctiva/sclera: Conjunctivae normal.     Pupils: Pupils are equal, round, and reactive to light.   Cardiovascular:     Rate and Rhythm: Normal rate and regular rhythm.     Pulses: Normal pulses.     Heart sounds: Normal heart sounds. No murmur heard. Pulmonary:     Effort: Pulmonary effort is normal. No respiratory distress.     Breath sounds: Wheezing and rales present.  Musculoskeletal:     Cervical back: Neck supple.     Right lower leg: No edema.     Left lower leg: No edema.  Lymphadenopathy:     Cervical: No cervical adenopathy.  Skin:    General: Skin is warm and dry.     Findings: No rash.  Neurological:     Mental Status: She is alert.       No results found for any visits on 03/30/23.  Assessment & Plan     Problem List Items Addressed This Visit       Respiratory   Airway hyperreactivity   Relevant Medications   predniSONE  (DELTASONE ) 20 MG tablet   Other Visit Diagnoses       Pneumonia of both lungs due to Mycoplasma pneumoniae, unspecified part of lung    -  Primary   Relevant Medications   azithromycin  (ZITHROMAX ) 250 MG tablet   guaiFENesin -codeine  100-10 MG/5ML syrup           Walking Pneumonia  Presents with upper respiratory illness for seven days with wheezing, coughing, dark brownish-yellow phlegm, fever, and significant fatigue. Physical exam reveals wheezing and crackles in the lungs. Differential diagnosis includes walking pneumonia, supported by symptoms and physical findings. Has been using a nebulizer and increased Mucinex  without significant improvement. Discussed azithromycin  (Z-Pak) and prednisone , including potential side effects and expected improvement timeline. Explained that azithromycin  is generally well-tolerated and prednisone  will help reduce lung inflammation. No need for chest x-ray unless symptoms persist. - Prescribe azithromycin  (Z-Pak): 2 pills on the first day, then 1 pill per day for the next 4 days - Prescribe prednisone : 20 mg pills, take 2 pills (40 mg) daily in the morning with breakfast for one week - Continue  using Symbicort and nebulizer as needed - Prescribe cough medicine with codeine , up to three times a day as needed - Advise to stay hydrated and continue using sinus rinse and hot steam vaporizer - Advise to stay home and avoid contact with others until fever-free for 24 hours and on antibiotics for at least 2 days - Advise to wear a mask when around others after the initial 2 days of antibiotics  Asthma Asthma symptoms have worsened with the current respiratory illness. Discussed the importance of continuing asthma management and using the nebulizer as needed. - Continue Symbicort - Use nebulizer as needed - Monitor asthma symptoms closely  Diarrhea Reports new onset diarrhea and fatigue, likely related to the upper respiratory illness. Differential includes side effects from metformin , but more likely related to the current illness. Discussed that respiratory infections can cause gastrointestinal symptoms and that the diarrhea should improve as the respiratory symptoms resolve. - Monitor diarrhea symptoms - Reassess if diarrhea persists after respiratory symptoms improve  Type 2 Diabetes Mellitus Recently started metformin  two weeks ago and reports minimal discomfort. Currently taking two doses daily with food. Discussed the importance of monitoring blood glucose levels and adjusting the medication as needed. - Continue metformin  as prescribed - Monitor blood glucose levels and adjust as needed  General Health Maintenance Concerned about missing social and volunteer activities due to illness. Advised to prioritize health and avoid spreading illness. Discussed the importance of rest and good hygiene practices. - Advise to rest and avoid strenuous activities until fully recovered - Encourage use of masks and good hygiene practices to prevent spreading illness  Follow-up - Follow-up if symptoms do not improve or worsen - No immediate need for chest x-ray unless symptoms persist.          Meds ordered this encounter  Medications   azithromycin  (ZITHROMAX ) 250 MG tablet    Sig: Take 500mg  PO daily x1d and then 250mg  daily x4 days    Dispense:  6 each    Refill:  0   predniSONE  (DELTASONE ) 20 MG tablet    Sig: Take 2 tablets (40 mg total) by mouth daily with breakfast for 7 days.    Dispense:  14 tablet    Refill:  0   guaiFENesin -codeine  100-10 MG/5ML syrup    Sig: Take 10 mLs by mouth 3 (three) times daily as needed for cough.    Dispense:  120 mL    Refill:  0     Return if symptoms worsen or fail to improve.      Jon Eva, MD  [facility] [phone] (phone) [phone] (fax)  [facility]

## 2023-04-10 DIAGNOSIS — J4541 Moderate persistent asthma with (acute) exacerbation: Secondary | ICD-10-CM | POA: Diagnosis not present

## 2023-04-14 ENCOUNTER — Encounter: Payer: Self-pay | Admitting: Family Medicine

## 2023-04-14 ENCOUNTER — Ambulatory Visit: Payer: Medicare Other | Admitting: Family Medicine

## 2023-04-14 VITALS — BP 130/83 | HR 82 | Ht 65.0 in | Wt 204.8 lb

## 2023-04-14 DIAGNOSIS — E039 Hypothyroidism, unspecified: Secondary | ICD-10-CM

## 2023-04-14 DIAGNOSIS — E785 Hyperlipidemia, unspecified: Secondary | ICD-10-CM

## 2023-04-14 DIAGNOSIS — I152 Hypertension secondary to endocrine disorders: Secondary | ICD-10-CM

## 2023-04-14 DIAGNOSIS — E1169 Type 2 diabetes mellitus with other specified complication: Secondary | ICD-10-CM | POA: Diagnosis not present

## 2023-04-14 DIAGNOSIS — E1159 Type 2 diabetes mellitus with other circulatory complications: Secondary | ICD-10-CM | POA: Diagnosis not present

## 2023-04-14 DIAGNOSIS — J4541 Moderate persistent asthma with (acute) exacerbation: Secondary | ICD-10-CM | POA: Diagnosis not present

## 2023-04-14 LAB — POCT GLYCOSYLATED HEMOGLOBIN (HGB A1C): Hemoglobin A1C: 6.6 % — AB (ref 4.0–5.6)

## 2023-04-14 MED ORDER — BLOOD GLUCOSE MONITORING SUPPL DEVI: 1.0000 | Freq: Three times a day (TID) | 0 refills | Status: AC

## 2023-04-14 MED ORDER — BLOOD GLUCOSE TEST VI STRP: 1.0000 | ORAL_STRIP | Freq: Three times a day (TID) | 1 refills | Status: DC

## 2023-04-14 MED ORDER — LANCET DEVICE MISC: 1.0000 | Freq: Three times a day (TID) | 0 refills | Status: AC

## 2023-04-14 MED ORDER — LANCETS MISC. MISC
1.0000 | Freq: Three times a day (TID) | 0 refills | Status: AC
Start: 2023-04-14 — End: 2023-05-14

## 2023-04-14 NOTE — Progress Notes (Signed)
Established patient visit   Patient: Angela Villarreal   DOB: September 09, 1955   68 y.o. Female  MRN: [id number] Visit Date: 04/14/2023  Today's healthcare provider: Shirlee Latch, MD   Chief Complaint  Patient presents with   Medical Management of Chronic Issues   Diabetes   Subjective    HPI HPI     Diabetes   Compliance with treatment is excellent.  Eye exam is not current (Aware to schedule visit).  Patient does not see a podiatrist.  The patient exercises daily.      Last edited by Acey Lav, CMA on 04/14/2023  9:50 AM.       Discussed the use of AI scribe software for clinical note transcription with the patient, who gave verbal consent to proceed.  History of Present Illness   The patient, with a history of diabetes, presents for a follow-up visit after a recent illness. They report feeling unwell and fatigued for the past few weeks, but note an improvement in symptoms after starting treatment with prednisone and colchicine. Despite the illness, the patient's A1c remains stable at 6.6. The patient has been off metformin while on colchicine due to concerns about kidney function. They also report some numbness in their fingers over the past few days, but it is unclear if this is related to their diabetes or another issue. The patient has been feeling fatigued and has been resting a lot, but is starting to increase their activity level again. They also note some coughing and wheezing at night, but this has improved with treatment.       Medications: Outpatient Medications Prior to Visit  Medication Sig   albuterol (PROVENTIL) (2.5 MG/3ML) 0.083% nebulizer solution Take 3 mLs (2.5 mg total) by nebulization every 6 (six) hours as needed for wheezing or shortness of breath.   albuterol (VENTOLIN HFA) 108 (90 Base) MCG/ACT inhaler Inhale 1-2 puffs into the lungs every 4 (four) hours as needed for wheezing or shortness of breath.   budesonide-formoterol (SYMBICORT)  160-4.5 MCG/ACT inhaler Inhale 2 puffs into the lungs 2 (two) times daily.   diltiazem (CARDIZEM CD) 120 MG 24 hr capsule TAKE 1 CAPSULE (120 MG) BY MOUTH EVERY DAY   fluticasone (FLONASE) 50 MCG/ACT nasal spray 2 SPRAYS IN EACH NOSTRIL EVERY DAY   guaiFENesin-codeine 100-10 MG/5ML syrup Take 10 mLs by mouth 3 (three) times daily as needed for cough.   hyoscyamine (LEVBID) 0.375 MG 12 hr tablet TAKE ONE TABLET BY MOUTH TWICE DAILY (Patient taking differently: as needed.)   loratadine (CLARITIN) 10 MG tablet Take 10 mg by mouth daily.   metFORMIN (GLUCOPHAGE) 500 MG tablet Take 1 tablet (500 mg total) by mouth 2 (two) times daily with a meal.   montelukast (SINGULAIR) 10 MG tablet Take 1 tablet (10 mg total) by mouth at bedtime.   Nebulizer MISC Use with nebulized liquid q 4-6 hours prn   omeprazole (PRILOSEC) 40 MG capsule Take 40 mg by mouth daily.   potassium chloride SA (KLOR-CON M) 20 MEQ tablet TAKE ONE TABLET TWICE DAILY   rosuvastatin (CRESTOR) 5 MG tablet TAKE 1 TABLET BY MOUTH DAILY   SYNTHROID 125 MCG tablet TAKE ONE TABLET EACH MORNING BEFORE BREAKFAST   triamterene-hydrochlorothiazide (MAXZIDE-25) 37.5-25 MG tablet TAKE 1 TABLET BY MOUTH DAILY   [DISCONTINUED] azithromycin (ZITHROMAX) 250 MG tablet Take 500mg  PO daily x1d and then 250mg  daily x4 days   No facility-administered medications prior to visit.    Review of Systems  Objective    BP 130/83   Pulse 82   Ht 5\' 5"  (1.651 m)   Wt 204 lb 12.8 oz (92.9 kg)   SpO2 96%   BMI 34.08 kg/m    Physical Exam Vitals reviewed.  Constitutional:      General: She is not in acute distress.    Appearance: Normal appearance. She is well-developed. She is not diaphoretic.  HENT:     Head: Normocephalic and atraumatic.  Eyes:     General: No scleral icterus.    Conjunctiva/sclera: Conjunctivae normal.  Neck:     Thyroid: No thyromegaly.  Cardiovascular:     Rate and Rhythm: Normal rate and regular rhythm.     Heart  sounds: Normal heart sounds. No murmur heard. Pulmonary:     Effort: Pulmonary effort is normal. No respiratory distress.     Breath sounds: Normal breath sounds. No wheezing, rhonchi or rales.  Musculoskeletal:     Cervical back: Neck supple.     Right lower leg: No edema.     Left lower leg: No edema.  Lymphadenopathy:     Cervical: No cervical adenopathy.  Skin:    General: Skin is warm and dry.     Findings: No rash.  Neurological:     Mental Status: She is alert and oriented to person, place, and time. Mental status is at baseline.  Psychiatric:        Mood and Affect: Mood normal.        Behavior: Behavior normal.      Results for orders placed or performed in visit on 04/14/23  POCT HgB A1C  Result Value Ref Range   Hemoglobin A1C 6.6 (A) 4.0 - 5.6 %   HbA1c POC (<> result, manual entry)     HbA1c, POC (prediabetic range)     HbA1c, POC (controlled diabetic range)      Assessment & Plan     Problem List Items Addressed This Visit       Cardiovascular and Mediastinum   Hypertension associated with diabetes (HCC)   Blood pressure 130/83 mmHg at recent Pulm appt, acceptable given recent illness and medication changes. Has home blood pressure monitor for regular checks. Discussed impact of recent medications and illness on blood pressure and importance of home monitoring. - Recheck blood pressure at home once or twice after completing colchicine course - Report any consistently high readings        Respiratory   Airway hyperreactivity     Endocrine   Hyperlipidemia associated with type 2 diabetes mellitus (HCC)   Adult hypothyroidism   Diabetes mellitus (HCC) - Primary   A1c remains at 6.6%, unchanged. Currently off azithromycin and prednisone, on day 5 of a 10-day colchicine course. Metformin paused due to potential kidney strain from colchicine. Discussed monitoring fasting blood glucose to identify patterns and make dietary adjustments. Emphasized moderation in  blood glucose monitoring to avoid obsessive behavior. Explained prednisone's impact on blood glucose and potential A1c improvement in three months. - Restart metformin after completing colchicine course in 5 days - Monitor fasting blood glucose levels daily - Provide glucometer and supplies - Schedule follow-up in 3 months to recheck A1c      Relevant Orders   POCT HgB A1C (Completed)        Asthma Significant symptom improvement after colchicine and Depo Medrol shot. No nocturnal wheezing or frequent coughing. Continues nebulizer as needed. Discussed importance of physical activity to build stamina and improve respiratory function. -  Continue current asthma management plan per Pulm - Encourage physical activity (10-15 minutes of walking twice daily)  General Health Maintenance Has not scheduled an eye exam, important for diabetes management. Foot exam performed today to check for neuropathy. Discussed importance of regular eye exams and foot care in diabetes management. - Call to schedule eye exam - Annual foot exam completed  Follow-up - Schedule follow-up appointment in 3 months.        Return in about 3 months (around 07/13/2023) for chronic disease f/u.       Shirlee Latch, MD  [facility] [phone] (phone) [phone] (fax)  [facility]

## 2023-04-14 NOTE — Assessment & Plan Note (Signed)
Blood pressure 130/83 mmHg at recent Pulm appt, acceptable given recent illness and medication changes. Has home blood pressure monitor for regular checks. Discussed impact of recent medications and illness on blood pressure and importance of home monitoring. - Recheck blood pressure at home once or twice after completing colchicine course - Report any consistently high readings

## 2023-04-14 NOTE — Assessment & Plan Note (Signed)
A1c remains at 6.6%, unchanged. Currently off azithromycin and prednisone, on day 5 of a 10-day colchicine course. Metformin paused due to potential kidney strain from colchicine. Discussed monitoring fasting blood glucose to identify patterns and make dietary adjustments. Emphasized moderation in blood glucose monitoring to avoid obsessive behavior. Explained prednisone's impact on blood glucose and potential A1c improvement in three months. - Restart metformin after completing colchicine course in 5 days - Monitor fasting blood glucose levels daily - Provide glucometer and supplies - Schedule follow-up in 3 months to recheck A1c

## 2023-04-22 DIAGNOSIS — J4540 Moderate persistent asthma, uncomplicated: Secondary | ICD-10-CM | POA: Diagnosis not present

## 2023-04-22 DIAGNOSIS — J300 Vasomotor rhinitis: Secondary | ICD-10-CM | POA: Diagnosis not present

## 2023-04-22 DIAGNOSIS — Z87898 Personal history of other specified conditions: Secondary | ICD-10-CM | POA: Diagnosis not present

## 2023-04-22 DIAGNOSIS — R053 Chronic cough: Secondary | ICD-10-CM | POA: Diagnosis not present

## 2023-05-12 ENCOUNTER — Ambulatory Visit (INDEPENDENT_AMBULATORY_CARE_PROVIDER_SITE_OTHER): Payer: Medicare Other | Admitting: Physician Assistant

## 2023-05-12 VITALS — BP 138/79 | HR 88 | Temp 98.4°F | Resp 18 | Ht 65.0 in | Wt 196.9 lb

## 2023-05-12 DIAGNOSIS — R55 Syncope and collapse: Secondary | ICD-10-CM

## 2023-05-12 DIAGNOSIS — W19XXXA Unspecified fall, initial encounter: Secondary | ICD-10-CM | POA: Diagnosis not present

## 2023-05-12 DIAGNOSIS — J069 Acute upper respiratory infection, unspecified: Secondary | ICD-10-CM

## 2023-05-12 DIAGNOSIS — K219 Gastro-esophageal reflux disease without esophagitis: Secondary | ICD-10-CM

## 2023-05-12 DIAGNOSIS — K529 Noninfective gastroenteritis and colitis, unspecified: Secondary | ICD-10-CM | POA: Diagnosis not present

## 2023-05-12 NOTE — Progress Notes (Unsigned)
 Established patient visit  Patient: Angela Villarreal   DOB: Sep 28, 1955   68 y.o. Female  MRN: [id number] Visit Date: 05/12/2023  Today's healthcare provider: Debera Lat, PA-C   Chief Complaint  Patient presents with   Gastroesophageal Reflux    Woke early Sunday morning with indigestion type feeling   Nausea    On Sunday, was sitting on toilet, became nauseous and passed out; nausea still continues although its improving   Loss of Consciousness    On Sunday, fell from toilet when she passed out and woke with large goose egg bump on forehead and bruising on R shoulder   Diarrhea    Started Sunday evening   Headache    Possibly related to recent illness with continued congestion or from hitting head; Home COVID and Flu tests negative   Head Injury    Hit head while falling Sunday, bruising on forehead immediately; today woke with bruising and minor swelling over the right eye   Subjective     HPI     Gastroesophageal Reflux    Additional comments: Woke early Sunday morning with indigestion type feeling        Nausea    Additional comments: On Sunday, was sitting on toilet, became nauseous and passed out; nausea still continues although its improving        Loss of Consciousness    Additional comments: On Sunday, fell from toilet when she passed out and woke with large goose egg bump on forehead and bruising on R shoulder        Diarrhea    Additional comments: Started Sunday evening        Headache    Additional comments: Possibly related to recent illness with continued congestion or from hitting head; Home COVID and Flu tests negative        Head Injury    Additional comments: Hit head while falling Sunday, bruising on forehead immediately; today woke with bruising and minor swelling over the right eye      Last edited by Ashok Cordia, CMA on 05/12/2023 11:10 AM.       Discussed the use of AI scribe software for clinical note transcription with  the patient, who gave verbal consent to proceed.  History of Present Illness   The patient, with a history of vasovagal syncope, asthma, and allergies, presents with a recent episode of syncope resulting in a fall and head injury. The patient reports waking up early in the morning with a feeling of indigestion, becoming nauseous, and passing out while in the restroom. The patient hit her head and shoulder during the fall, resulting in immediate bruising and swelling on the forehead and shoulder. The patient did not seek immediate medical attention post-fall. The patient denies any weakness, speech problems, or vision disturbances post-fall.  In addition to the syncope episode, the patient has been experiencing ongoing respiratory symptoms. The patient reports having a cold with congestion, which she believes may be contributing to her headache. The patient has a history of asthma and has been dealing with these respiratory symptoms for the past couple of months, starting with a respiratory infection. The patient reports feeling nauseous and vomiting during a recent weekend trip, suspecting a possible stomach bug. The patient denies current nausea but reports ongoing congestion, coughing, and post-nasal drip. The patient has been managing her symptoms with over-the-counter medications like Flonase and Claritin.           05/12/2023   11:12 AM 01/05/2023  2:57 PM 10/29/2022    9:50 AM  Depression screen PHQ 2/9  Decreased Interest 0 0 0  Down, Depressed, Hopeless 0 0 0  PHQ - 2 Score 0 0 0  Altered sleeping 0 2   Tired, decreased energy 2 2   Change in appetite 0 0   Feeling bad or failure about yourself  0 0   Trouble concentrating 0 0   Moving slowly or fidgety/restless 0 0   Suicidal thoughts 0 0   PHQ-9 Score 2 4   Difficult doing work/chores Not difficult at all Not difficult at all       05/12/2023   11:12 AM 01/05/2023    2:57 PM 08/30/2021    9:47 AM 02/15/2021    9:59 AM  GAD 7  : Generalized Anxiety Score  Nervous, Anxious, on Edge 0 0 0 1  Control/stop worrying 0 0 0 0  Worry too much - different things 0 0 0 0  Trouble relaxing 0 0 0 0  Restless 0 0 0 0  Easily annoyed or irritable 0 0 0 0  Afraid - awful might happen 0 0 0 0  Total GAD 7 Score 0 0 0 1  Anxiety Difficulty   Not difficult at all Not difficult at all    Medications: Outpatient Medications Prior to Visit  Medication Sig   albuterol (PROVENTIL) (2.5 MG/3ML) 0.083% nebulizer solution Take 3 mLs (2.5 mg total) by nebulization every 6 (six) hours as needed for wheezing or shortness of breath.   albuterol (VENTOLIN HFA) 108 (90 Base) MCG/ACT inhaler Inhale 1-2 puffs into the lungs every 4 (four) hours as needed for wheezing or shortness of breath.   Blood Glucose Monitoring Suppl DEVI 1 each by Does not apply route in the morning, at noon, and at bedtime. May substitute to any manufacturer covered by patient's insurance.   budesonide-formoterol (SYMBICORT) 160-4.5 MCG/ACT inhaler Inhale 2 puffs into the lungs 2 (two) times daily.   diltiazem (CARDIZEM CD) 120 MG 24 hr capsule TAKE 1 CAPSULE (120 MG) BY MOUTH EVERY DAY   fluticasone (FLONASE) 50 MCG/ACT nasal spray 2 SPRAYS IN EACH NOSTRIL EVERY DAY   Glucose Blood (BLOOD GLUCOSE TEST STRIPS) STRP 1 each by In Vitro route in the morning, at noon, and at bedtime. May substitute to any manufacturer covered by patient's insurance.   guaiFENesin-codeine 100-10 MG/5ML syrup Take 10 mLs by mouth 3 (three) times daily as needed for cough.   hyoscyamine (LEVBID) 0.375 MG 12 hr tablet TAKE ONE TABLET BY MOUTH TWICE DAILY (Patient taking differently: as needed.)   Lancet Device MISC 1 each by Does not apply route in the morning, at noon, and at bedtime. May substitute to any manufacturer covered by patient's insurance.   Lancets Misc. MISC 1 each by Does not apply route in the morning, at noon, and at bedtime. May substitute to any manufacturer covered by patient's  insurance.   loratadine (CLARITIN) 10 MG tablet Take 10 mg by mouth daily.   metFORMIN (GLUCOPHAGE) 500 MG tablet Take 1 tablet (500 mg total) by mouth 2 (two) times daily with a meal.   montelukast (SINGULAIR) 10 MG tablet Take 1 tablet (10 mg total) by mouth at bedtime.   Nebulizer MISC Use with nebulized liquid q 4-6 hours prn   omeprazole (PRILOSEC) 40 MG capsule Take 40 mg by mouth daily.   potassium chloride SA (KLOR-CON M) 20 MEQ tablet TAKE ONE TABLET TWICE DAILY   rosuvastatin (CRESTOR) 5  MG tablet TAKE 1 TABLET BY MOUTH DAILY   SYNTHROID 125 MCG tablet TAKE ONE TABLET EACH MORNING BEFORE BREAKFAST   triamterene-hydrochlorothiazide (MAXZIDE-25) 37.5-25 MG tablet TAKE 1 TABLET BY MOUTH DAILY   No facility-administered medications prior to visit.    Review of Systems All negative Except see HPI   {Insert previous labs (optional):23779} {See past labs  Heme  Chem  Endocrine  Serology  Results Review (optional):1}   Objective    BP 138/79   Pulse 88   Temp 98.4 F (36.9 C)   Resp 18   Ht 5\' 5"  (1.651 m)   Wt 196 lb 14.4 oz (89.3 kg)   SpO2 98%   BMI 32.77 kg/m  {Insert last BP/Wt (optional):23777}{See vitals history (optional):1}   Physical Exam   No results found for any visits on 05/12/23.      Assessment and Plan    Syncope Episode of syncope associated with nausea and indigestion. No weakness, speech, or vision disturbances. No shortness of breath. Patient has a history of vasovagal syncope. -Consider CT scan of the head if symptoms persist or worsen.  Head Trauma Patient fell and hit her head during the syncope episode, resulting in immediate bruising and swelling. No symptoms of concussion reported. -Observe for any worsening symptoms or signs of concussion. Consider CT scan if symptoms persist or worsen.  Upper Respiratory Infection Ongoing symptoms of congestion, cough, and postnasal drip. Recent history of walking pneumonia. Currently on Flonase  and Claritin. -Continue Flonase and Claritin. Add nasal saline rinse before Flonase for better absorption. Consider switching from Claritin to a stronger antihistamine like Zyrtec or Xyzal if symptoms persist.  Gastroenteritis Recent episode of nausea, vomiting, and diarrhea. No current nausea. -Encourage hydration with electrolyte-containing fluids like Pedialyte. Continue bland diet until symptoms resolve.  Acid Reflux Patient on omeprazole. -Continue omeprazole. Reinforce lifestyle modifications including not eating before bed, elevating the head while sleeping, and avoiding overeating.  General Health Maintenance -Continue multivitamin. Consider immune-boosting supplements. -Plan for comprehensive blood work during wellness check in March. -Encourage use of humidifier and adequate hydration. -Consider antibiotics if sinus symptoms persist or worsen.        No orders of the defined types were placed in this encounter.   No follow-ups on file.   The patient was advised to call back or seek an in-person evaluation if the symptoms worsen or if the condition fails to improve as anticipated.  I discussed the assessment and treatment plan with the patient. The patient was provided an opportunity to ask questions and all were answered. The patient agreed with the plan and demonstrated an understanding of the instructions.  I, Debera Lat, PA-C have reviewed all documentation for this visit. The documentation on 05/12/2023  for the exam, diagnosis, procedures, and orders are all accurate and complete.  Debera Lat, [facility], MMS [facility] [phone] (phone) [phone] (fax)  [facility]

## 2023-05-14 ENCOUNTER — Encounter: Payer: Self-pay | Admitting: Physician Assistant

## 2023-05-28 ENCOUNTER — Telehealth: Admitting: Physician Assistant

## 2023-05-28 DIAGNOSIS — R3989 Other symptoms and signs involving the genitourinary system: Secondary | ICD-10-CM

## 2023-05-28 MED ORDER — SULFAMETHOXAZOLE-TRIMETHOPRIM 800-160 MG PO TABS: 1.0000 | ORAL_TABLET | Freq: Two times a day (BID) | ORAL | 0 refills | Status: DC

## 2023-05-28 NOTE — Progress Notes (Signed)
 Virtual Visit Consent   Angela Villarreal, you are scheduled for a virtual visit with a [facility] provider today. Just as with appointments in the office, your consent must be obtained to participate. Your consent will be active for this visit and any virtual visit you may have with one of our providers in the next 365 days. If you have a MyChart account, a copy of this consent can be sent to you electronically.  As this is a virtual visit, video technology does not allow for your provider to perform a traditional examination. This may limit your provider's ability to fully assess your condition. If your provider identifies any concerns that need to be evaluated in person or the need to arrange testing (such as labs, EKG, etc.), we will make arrangements to do so. Although advances in technology are sophisticated, we cannot ensure that it will always work on either your end or our end. If the connection with a video visit is poor, the visit may have to be switched to a telephone visit. With either a video or telephone visit, we are not always able to ensure that we have a secure connection.  By engaging in this virtual visit, you consent to the provision of healthcare and authorize for your insurance to be billed (if applicable) for the services provided during this visit. Depending on your insurance coverage, you may receive a charge related to this service.  I need to obtain your verbal consent now. Are you willing to proceed with your visit today? Angela Villarreal has provided verbal consent on 05/28/2023 for a virtual visit (video or telephone). Angela Villarreal, [place]  Date: 05/28/2023 12:12 PM   Virtual Visit via Video Note   I, Angela Villarreal, connected with  Angela Villarreal  ([id number], [birth year]) on 05/28/23 at 12:15 PM EDT by a video-enabled telemedicine application and verified that I am speaking with the correct person using two identifiers.  Location: Patient: Virtual Visit  Location Patient: Home Provider: Virtual Visit Location Provider: Home Office   I discussed the limitations of evaluation and management by telemedicine and the availability of in person appointments. The patient expressed understanding and agreed to proceed.    History of Present Illness: Angela Villarreal is a 68 y.o. who identifies as a female who was assigned female at birth, and is being seen today for possible UTI. Patient endorses symptoms starting yesterday and worsening today. Notes urinary urgency, frequency, dysuria without fever, chills, flank pain, hematuria.  Prior history of UTI and notes this feels identical.   HPI: HPI  Problems:  Patient Active Problem List   Diagnosis Date Noted   Diabetes mellitus (HCC) 03/17/2023   Elevated LFTs 08/30/2021   De Quervain's tenosynovitis, left 08/30/2021   Choledocholithiasis 06/17/2021   Anxiety 08/09/2020   Sprain of anterior talofibular ligament of left ankle 07/14/2019   Venous insufficiency of both lower extremities 01/05/2019   History of adenomatous polyp of colon 12/01/2017   Varicose veins of bilateral lower extremities with pain 09/04/2016   History of malignant melanoma 09/04/2016   IBS (irritable bowel syndrome) 09/04/2015   Hypokalemia 03/05/2015   Post-menopausal 11/22/2014   Allergic rhinitis 08/17/2014   Airway hyperreactivity 08/17/2014   Family history of cardiomyopathy 08/17/2014   Hypertension associated with diabetes (HCC) 08/17/2014   Adult hypothyroidism 08/17/2014   Obesity 08/17/2014   Arthritis, degenerative 08/17/2014   Cardiac murmur 07/18/2014   Hyperlipidemia associated with type 2 diabetes mellitus (HCC) 07/17/2014  Allergies:  Allergies  Allergen Reactions   Amoxicillin Hives    Other reaction(s): Rash, Hives () Other reaction(s): Rash, Hives () Other reaction(s): Rash, Hives () Other reaction(s): Rash, Hives () Other reaction(s): Rash, Hives () Other reaction(s): Rash, Hives () Other  reaction(s): Rash, Hives ()    Gentamicin Sulfate    Lisinopril Cough   Medications:  Current Outpatient Medications:    sulfamethoxazole-trimethoprim (BACTRIM DS) 800-160 MG tablet, Take 1 tablet by mouth 2 (two) times daily., Disp: 10 tablet, Rfl: 0   albuterol (PROVENTIL) (2.5 MG/3ML) 0.083% nebulizer solution, Take 3 mLs (2.5 mg total) by nebulization every 6 (six) hours as needed for wheezing or shortness of breath., Disp: 150 mL, Rfl: 1   albuterol (VENTOLIN HFA) 108 (90 Base) MCG/ACT inhaler, Inhale 1-2 puffs into the lungs every 4 (four) hours as needed for wheezing or shortness of breath., Disp: 3 each, Rfl: 3   Blood Glucose Monitoring Suppl DEVI, 1 each by Does not apply route in the morning, at noon, and at bedtime. May substitute to any manufacturer covered by patient's insurance., Disp: 1 each, Rfl: 0   budesonide-formoterol (SYMBICORT) 160-4.5 MCG/ACT inhaler, Inhale 2 puffs into the lungs 2 (two) times daily., Disp: , Rfl:    diltiazem (CARDIZEM CD) 120 MG 24 hr capsule, TAKE 1 CAPSULE (120 MG) BY MOUTH EVERY DAY, Disp: 90 capsule, Rfl: 1   fluticasone (FLONASE) 50 MCG/ACT nasal spray, 2 SPRAYS IN EACH NOSTRIL EVERY DAY, Disp: 16 g, Rfl: 6   Glucose Blood (BLOOD GLUCOSE TEST STRIPS) STRP, 1 each by In Vitro route in the morning, at noon, and at bedtime. May substitute to any manufacturer covered by patient's insurance., Disp: 100 strip, Rfl: 1   guaiFENesin-codeine 100-10 MG/5ML syrup, Take 10 mLs by mouth 3 (three) times daily as needed for cough., Disp: 120 mL, Rfl: 0   hyoscyamine (LEVBID) 0.375 MG 12 hr tablet, TAKE ONE TABLET BY MOUTH TWICE DAILY (Patient taking differently: as needed.), Disp: 60 tablet, Rfl: 3   loratadine (CLARITIN) 10 MG tablet, Take 10 mg by mouth daily., Disp: , Rfl:    metFORMIN (GLUCOPHAGE) 500 MG tablet, Take 1 tablet (500 mg total) by mouth 2 (two) times daily with a meal., Disp: 60 tablet, Rfl: 3   montelukast (SINGULAIR) 10 MG tablet, Take 1 tablet  (10 mg total) by mouth at bedtime., Disp: 90 tablet, Rfl: 1   Nebulizer MISC, Use with nebulized liquid q 4-6 hours prn, Disp: 1 each, Rfl: 0   omeprazole (PRILOSEC) 40 MG capsule, Take 40 mg by mouth daily., Disp: , Rfl:    potassium chloride SA (KLOR-CON M) 20 MEQ tablet, TAKE ONE TABLET TWICE DAILY, Disp: 180 tablet, Rfl: 2   rosuvastatin (CRESTOR) 5 MG tablet, TAKE 1 TABLET BY MOUTH DAILY, Disp: 90 tablet, Rfl: 1   SYNTHROID 125 MCG tablet, TAKE ONE TABLET EACH MORNING BEFORE BREAKFAST, Disp: 90 tablet, Rfl: 1   triamterene-hydrochlorothiazide (MAXZIDE-25) 37.5-25 MG tablet, TAKE 1 TABLET BY MOUTH DAILY, Disp: 90 tablet, Rfl: 1  Observations/Objective: Patient is well-developed, well-nourished in no acute distress.  Resting comfortably at home.  Head is normocephalic, atraumatic.  No labored breathing. Speech is clear and coherent with logical content.  Patient is alert and oriented at baseline.   Assessment and Plan: 1. Suspected UTI (Primary) - sulfamethoxazole-trimethoprim (BACTRIM DS) 800-160 MG tablet; Take 1 tablet by mouth 2 (two) times daily.  Dispense: 10 tablet; Refill: 0  Classic UTI symptoms with absence of alarm signs or  symptoms. Prior history of UTI. Will treat empirically with Bactrim for suspected uncomplicated cystitis. Supportive measures and OTC medications reviewed. Strict in-person evaluation precautions discussed.    Follow Up Instructions: I discussed the assessment and treatment plan with the patient. The patient was provided an opportunity to ask questions and all were answered. The patient agreed with the plan and demonstrated an understanding of the instructions.  A copy of instructions were sent to the patient via MyChart unless otherwise noted below.   The patient was advised to call back or seek an in-person evaluation if the symptoms worsen or if the condition fails to improve as anticipated.    Angela Climes, PA-C

## 2023-05-28 NOTE — Patient Instructions (Signed)
 Angela Villarreal, thank you for joining [name], PA-C for today's virtual visit.  While this provider is not your primary care provider (PCP), if your PCP is located in our provider database this encounter information will be shared with them immediately following your visit.   A [facility] MyChart account gives you access to today's visit and all your visits, tests, and labs performed at [facility] " click here if you don't have a [facility] MyChart account or go to [email or web address]  Consent: (Patient) Angela Villarreal provided verbal consent for this virtual visit at the beginning of the encounter.  Current Medications:  Current Outpatient Medications:    albuterol (PROVENTIL) (2.5 MG/3ML) 0.083% nebulizer solution, Take 3 mLs (2.5 mg total) by nebulization every 6 (six) hours as needed for wheezing or shortness of breath., Disp: 150 mL, Rfl: 1   albuterol (VENTOLIN HFA) 108 (90 Base) MCG/ACT inhaler, Inhale 1-2 puffs into the lungs every 4 (four) hours as needed for wheezing or shortness of breath., Disp: 3 each, Rfl: 3   Blood Glucose Monitoring Suppl DEVI, 1 each by Does not apply route in the morning, at noon, and at bedtime. May substitute to any manufacturer covered by patient's insurance., Disp: 1 each, Rfl: 0   budesonide-formoterol (SYMBICORT) 160-4.5 MCG/ACT inhaler, Inhale 2 puffs into the lungs 2 (two) times daily., Disp: , Rfl:    diltiazem (CARDIZEM CD) 120 MG 24 hr capsule, TAKE 1 CAPSULE (120 MG) BY MOUTH EVERY DAY, Disp: 90 capsule, Rfl: 1   fluticasone (FLONASE) 50 MCG/ACT nasal spray, 2 SPRAYS IN EACH NOSTRIL EVERY DAY, Disp: 16 g, Rfl: 6   Glucose Blood (BLOOD GLUCOSE TEST STRIPS) STRP, 1 each by In Vitro route in the morning, at noon, and at bedtime. May substitute to any manufacturer covered by patient's insurance., Disp: 100 strip, Rfl: 1   guaiFENesin-codeine 100-10 MG/5ML syrup, Take 10 mLs by mouth 3 (three) times daily as needed for  cough., Disp: 120 mL, Rfl: 0   hyoscyamine (LEVBID) 0.375 MG 12 hr tablet, TAKE ONE TABLET BY MOUTH TWICE DAILY (Patient taking differently: as needed.), Disp: 60 tablet, Rfl: 3   loratadine (CLARITIN) 10 MG tablet, Take 10 mg by mouth daily., Disp: , Rfl:    metFORMIN (GLUCOPHAGE) 500 MG tablet, Take 1 tablet (500 mg total) by mouth 2 (two) times daily with a meal., Disp: 60 tablet, Rfl: 3   montelukast (SINGULAIR) 10 MG tablet, Take 1 tablet (10 mg total) by mouth at bedtime., Disp: 90 tablet, Rfl: 1   Nebulizer MISC, Use with nebulized liquid q 4-6 hours prn, Disp: 1 each, Rfl: 0   omeprazole (PRILOSEC) 40 MG capsule, Take 40 mg by mouth daily., Disp: , Rfl:    potassium chloride SA (KLOR-CON M) 20 MEQ tablet, TAKE ONE TABLET TWICE DAILY, Disp: 180 tablet, Rfl: 2   rosuvastatin (CRESTOR) 5 MG tablet, TAKE 1 TABLET BY MOUTH DAILY, Disp: 90 tablet, Rfl: 1   SYNTHROID 125 MCG tablet, TAKE ONE TABLET EACH MORNING BEFORE BREAKFAST, Disp: 90 tablet, Rfl: 1   triamterene-hydrochlorothiazide (MAXZIDE-25) 37.5-25 MG tablet, TAKE 1 TABLET BY MOUTH DAILY, Disp: 90 tablet, Rfl: 1   Medications ordered in this encounter:  No orders of the defined types were placed in this encounter.    *If you need refills on other medications prior to your next appointment, please contact your pharmacy*  Follow-Up: Call back or seek an in-person evaluation if the symptoms worsen or if the condition fails  to improve as anticipated.  [facility] [phone]  Other Instructions Your symptoms are consistent with a bladder infection, also called acute cystitis. Please take your antibiotic (Bactrim) as directed until all pills are gone.  Stay very well hydrated.  Consider a daily probiotic (Align, Culturelle, or Activia) to help prevent stomach upset caused by the antibiotic.  Taking a probiotic daily may also help prevent recurrent UTIs.  Also consider taking AZO (Phenazopyridine) tablets to help decrease  pain with urination.  Urinary Tract Infection A urinary tract infection (UTI) can occur any place along the urinary tract. The tract includes the kidneys, ureters, bladder, and urethra. A type of germ called bacteria often causes a UTI. UTIs are often helped with antibiotic medicine.  HOME CARE  If given, take antibiotics as told by your doctor. Finish them even if you start to feel better. Drink enough fluids to keep your pee (urine) clear or pale yellow. Avoid tea, drinks with caffeine, and bubbly (carbonated) drinks. Pee often. Avoid holding your pee in for a long time. Pee before and after having sex (intercourse). Wipe from front to back after you poop (bowel movement) if you are a woman. Use each tissue only once. GET HELP RIGHT AWAY IF:  You have back pain. You have lower belly (abdominal) pain. You have chills. You feel sick to your stomach (nauseous). You throw up (vomit). Your burning or discomfort with peeing does not go away. You have a fever. Your symptoms are not better in 3 days. MAKE SURE YOU:  Understand these instructions. Will watch your condition. Will get help right away if you are not doing well or get worse. Document Released: 08/20/2007 Document Revised: 11/26/2011 Document Reviewed: 10/02/2011 [facility] Patient Information 2015 [place], [place]. This information is not intended to replace advice given to you by your health care provider. Make sure you discuss any questions you have with your health care provider.    If you have been instructed to have an in-person evaluation today at a local Urgent Care facility, please use the link below. It will take you to a list of all of our available [facility], including address, phone number and hours of operation. Please do not delay care.  [facility]  If you or a family member do not have a primary care provider, use the link below to schedule a visit and establish care. When you choose a  [facility] primary care physician or advanced practice provider, you gain a long-term partner in health. Find a Primary Care Provider  Learn more about [facility]'s in-office and virtual care options: [facility] - Get Care Now

## 2023-06-03 ENCOUNTER — Ambulatory Visit: Payer: Medicare Other | Admitting: Emergency Medicine

## 2023-06-03 VITALS — Ht 65.0 in | Wt 197.0 lb

## 2023-06-03 DIAGNOSIS — Z0000 Encounter for general adult medical examination without abnormal findings: Secondary | ICD-10-CM

## 2023-06-03 DIAGNOSIS — E1169 Type 2 diabetes mellitus with other specified complication: Secondary | ICD-10-CM | POA: Diagnosis not present

## 2023-06-03 DIAGNOSIS — Z0001 Encounter for general adult medical examination with abnormal findings: Secondary | ICD-10-CM

## 2023-06-03 DIAGNOSIS — Z1231 Encounter for screening mammogram for malignant neoplasm of breast: Secondary | ICD-10-CM

## 2023-06-03 NOTE — Progress Notes (Signed)
 Subjective:   Angela Villarreal is a 68 y.o. who presents for a Medicare Wellness preventive visit.  Visit Complete: Virtual I connected with  Angela Villarreal on 06/03/23 by a audio enabled telemedicine application and verified that I am speaking with the correct person using two identifiers.  Patient Location: Other:  Sister's house in [place], [place]  Provider Location: Home Office  I discussed the limitations of evaluation and management by telemedicine. The patient expressed understanding and agreed to proceed.  Vital Signs: Because this visit was a virtual/telehealth visit, some criteria may be missing or patient reported. Any vitals not documented were not able to be obtained and vitals that have been documented are patient reported.  VideoDeclined- This patient declined Librarian, academic. Therefore the visit was completed with audio only.  Persons Participating in Visit: Patient.  AWV Questionnaire: No: Patient Medicare AWV questionnaire was not completed prior to this visit.  Cardiac Risk Factors include: advanced age (>21men, >17 women);dyslipidemia;diabetes mellitus;hypertension;obesity (BMI >30kg/m2)     Objective:    Today's Vitals   06/03/23 1136  Weight: 197 lb (89.4 kg)  Height: 5\' 5"  (1.651 m)   Body mass index is 32.78 kg/m.     06/03/2023   11:57 AM 08/21/2022   10:17 AM 06/03/2022    3:19 PM 06/19/2021    2:44 PM 06/18/2021   10:33 AM 06/17/2021    6:09 PM 06/17/2021    9:11 AM  Advanced Directives  Does Patient Have a Medical Advance Directive? No;Yes No No No No No No  Type of Diplomatic Services operational officer;Living will        Does patient want to make changes to medical advance directive? Yes (MAU/Ambulatory/Procedural Areas - Information given)        Copy of Healthcare Power of Attorney in Chart? No - copy requested        Would patient like information on creating a medical advance directive?    No - Patient declined  No - Patient declined No - Patient declined     Current Medications (verified) Outpatient Encounter Medications as of 06/03/2023  Medication Sig   albuterol (PROVENTIL) (2.5 MG/3ML) 0.083% nebulizer solution Take 3 mLs (2.5 mg total) by nebulization every 6 (six) hours as needed for wheezing or shortness of breath.   albuterol (VENTOLIN HFA) 108 (90 Base) MCG/ACT inhaler Inhale 1-2 puffs into the lungs every 4 (four) hours as needed for wheezing or shortness of breath.   Ascorbic Acid (VITAMIN C ADULT GUMMIES PO) Take 1 tablet by mouth daily.   Blood Glucose Monitoring Suppl DEVI 1 each by Does not apply route in the morning, at noon, and at bedtime. May substitute to any manufacturer covered by patient's insurance.   budesonide-formoterol (SYMBICORT) 160-4.5 MCG/ACT inhaler Inhale 2 puffs into the lungs 2 (two) times daily.   diltiazem (CARDIZEM CD) 120 MG 24 hr capsule TAKE 1 CAPSULE (120 MG) BY MOUTH EVERY DAY   fluticasone (FLONASE) 50 MCG/ACT nasal spray 2 SPRAYS IN EACH NOSTRIL EVERY DAY   Glucose Blood (BLOOD GLUCOSE TEST STRIPS) STRP 1 each by In Vitro route in the morning, at noon, and at bedtime. May substitute to any manufacturer covered by patient's insurance.   hyoscyamine (LEVBID) 0.375 MG 12 hr tablet TAKE ONE TABLET BY MOUTH TWICE DAILY (Patient taking differently: as needed.)   loratadine (CLARITIN) 10 MG tablet Take 10 mg by mouth daily.   metFORMIN (GLUCOPHAGE) 500 MG tablet Take 1 tablet (  500 mg total) by mouth 2 (two) times daily with a meal.   montelukast (SINGULAIR) 10 MG tablet Take 1 tablet (10 mg total) by mouth at bedtime.   Multiple Vitamin (MULTIVITAMIN) tablet Take 1 tablet by mouth daily.   Nebulizer MISC Use with nebulized liquid q 4-6 hours prn   omeprazole (PRILOSEC) 40 MG capsule Take 40 mg by mouth daily.   potassium chloride SA (KLOR-CON M) 20 MEQ tablet TAKE ONE TABLET TWICE DAILY   rosuvastatin (CRESTOR) 5 MG tablet TAKE 1 TABLET BY MOUTH DAILY   SYNTHROID  125 MCG tablet TAKE ONE TABLET EACH MORNING BEFORE BREAKFAST   triamterene-hydrochlorothiazide (MAXZIDE-25) 37.5-25 MG tablet TAKE 1 TABLET BY MOUTH DAILY   Zinc Citrate (ZINC GUMMY PO) Take 1 tablet by mouth daily.   guaiFENesin-codeine 100-10 MG/5ML syrup Take 10 mLs by mouth 3 (three) times daily as needed for cough. (Patient not taking: Reported on 06/03/2023)   sulfamethoxazole-trimethoprim (BACTRIM DS) 800-160 MG tablet Take 1 tablet by mouth 2 (two) times daily. (Patient not taking: Reported on 06/03/2023)   No facility-administered encounter medications on file as of 06/03/2023.    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 AMP. Excised: 04/19/2009, margins free.   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 patient reports she also had ovaries removed.    BACK SURGERY  1989, 2000   BLADDER SURGERY  2001   Tacked   CHOLECYSTECTOMY  06/2021   COLONOSCOPY WITH PROPOFOL N/A 11/27/2017   Procedure: COLONOSCOPY WITH PROPOFOL;  Surgeon: Wyline Mood, MD;  Location: [facility] ENDOSCOPY;  Service: Gastroenterology;  Laterality: N/A;   COLONOSCOPY WITH PROPOFOL N/A 03/26/2021   Procedure: COLONOSCOPY WITH PROPOFOL;  Surgeon: Wyline Mood, MD;  Location: [facility] ENDOSCOPY;  Service: Gastroenterology;  Laterality: N/A;   ERCP N/A 06/18/2021   Procedure: ENDOSCOPIC RETROGRADE CHOLANGIOPANCREATOGRAPHY (ERCP);  Surgeon: Midge Minium, MD;  Location: [facility] ENDOSCOPY;  Service: Endoscopy;  Laterality: N/A;   MELANOMA EXCISION  02/2004   removed from face   OOPHORECTOMY     Family History  Problem Relation Age of  Onset   Colon cancer Mother    Squamous cell carcinoma Mother    Alzheimer's disease Mother    Cancer Mother        colon   Hypertension Mother    Non-Hodgkin's lymphoma Father    Cancer Father    Varicose Veins Father    Thyroid disease Sister    Breast cancer Neg Hx    Social History   Socioeconomic History   Marital status: Significant Other    Spouse name: Not on file   Number of children: 1   Years of education: Not on file   Highest education level: Master's degree (e.g., MA, MS, MEng, MEd, MSW, MBA)  Occupational History   Occupation: retired  Tobacco Use   Smoking status: Never   Smokeless tobacco: Never  Vaping Use   Vaping status: Never Used  Substance and Sexual Activity   Alcohol use: Yes    Comment: 2-3 drinks when in social situation 6-8 times/year   Drug use:  No   Sexual activity: Yes    Birth control/protection: Post-menopausal  Other Topics Concern   Not on file  Social History Narrative   Living with partner   Social Drivers of Health   Financial Resource Strain: Low Risk  (06/03/2023)   Overall Financial Resource Strain (CARDIA)    Difficulty of Paying Living Expenses: Not hard at all  Food Insecurity: No Food Insecurity (06/03/2023)   Hunger Vital Sign    Worried About Running Out of Food in the Last Year: Never true    Ran Out of Food in the Last Year: Never true  Transportation Needs: No Transportation Needs (06/03/2023)   PRAPARE - Administrator, Civil Service (Medical): No    Lack of Transportation (Non-Medical): No  Physical Activity: Insufficiently Active (06/03/2023)   Exercise Vital Sign    Days of Exercise per Week: 3 days    Minutes of Exercise per Session: 30 min  Stress: No Stress Concern Present (06/03/2023)   [employer] of Occupational Health - Occupational Stress Questionnaire    Feeling of Stress : Only a little  Social Connections: Socially Integrated (06/03/2023)   Social Connection and Isolation Panel  [NHANES]    Frequency of Communication with Friends and Family: More than three times a week    Frequency of Social Gatherings with Friends and Family: More than three times a week    Attends Religious Services: More than 4 times per year    Active Member of [employer] or Organizations: Yes    Attends Engineer, structural: More than 4 times per year    Marital Status: Living with partner    Tobacco Counseling Counseling given: Not Answered    Clinical Intake:  Pre-visit preparation completed: Yes  Pain : No/denies pain     BMI - recorded: 32.78 Nutritional Status: BMI > 30  Obese Nutritional Risks: None Diabetes: Yes CBG done?: No (FBS 108) Did pt. bring in CBG monitor from home?: No  Lab Results  Component Value Date   HGBA1C 6.6 (A) 04/14/2023   HGBA1C 6.6 (H) 01/05/2023   HGBA1C 6.4 (H) 06/17/2022     How often do you need to have someone help you when you read instructions, pamphlets, or other written materials from your doctor or pharmacy?: 1 - Never  Interpreter Needed?: No  Information entered by :: Tora Kindred, CMA   Activities of Daily Living     06/03/2023   11:40 AM 08/21/2022   10:17 AM  In your present state of health, do you have any difficulty performing the following activities:  Hearing? 0 0  Vision? 0 0  Difficulty concentrating or making decisions? 0 0  Walking or climbing stairs? 0 0  Dressing or bathing? 0 0  Doing errands, shopping? 0 0  Preparing Food and eating ? N   Using the Toilet? N   In the past six months, have you accidently leaked urine? N   Do you have problems with loss of bowel control? N   Managing your Medications? N   Managing your Finances? N   Housekeeping or managing your Housekeeping? N     Patient Care Team: Erasmo Downer, MD as PCP - General (Family Medicine) Deirdre Evener, MD (Dermatology) Linus Salmons, MD (Otolaryngology) Wyline Mood, MD as Consulting Physician  (Gastroenterology) Marlene Bast [facility])  Indicate any recent Medical Services you may have received from other than [facility] providers in the past year (date may be approximate).  Assessment:   This is a routine wellness examination for Jonathan.  Hearing/Vision screen Hearing Screening - Comments:: Denies hearing loss Vision Screening - Comments:: Gets eye exams, Dr. Santiago Bumpers, [facility]   Goals Addressed             This Visit's Progress    Exercise 150 min/wk Moderate Activity         Depression Screen     06/03/2023   11:54 AM 05/12/2023   11:12 AM 01/05/2023    2:57 PM 10/29/2022    9:50 AM 08/21/2022   10:17 AM 07/08/2022   11:20 AM 06/17/2022   10:29 AM  PHQ 2/9 Scores  PHQ - 2 Score 0 0 0 0 0 0 0  PHQ- 9 Score 0 2 4  0 0 3    Fall Risk     06/03/2023   11:59 AM 05/12/2023   11:12 AM 01/05/2023    2:45 PM 10/29/2022    9:50 AM 08/21/2022   10:17 AM  Fall Risk   Falls in the past year? 0 0 1 0 0  Number falls in past yr: 0 0 1 0 0  Injury with Fall? 0 0 1 0 0  Risk for fall due to : No Fall Risks  History of fall(s);Impaired balance/gait;Orthopedic patient No Fall Risks   Follow up Falls prevention discussed;Falls evaluation completed  Education provided;Falls prevention discussed;Falls evaluation completed Falls evaluation completed     MEDICARE RISK AT HOME:  Medicare Risk at Home Any stairs in or around the home?: Yes If so, are there any without handrails?: No Home free of loose throw rugs in walkways, pet beds, electrical cords, etc?: Yes Adequate lighting in your home to reduce risk of falls?: Yes Life alert?: No Use of a cane, walker or w/c?: No Grab bars in the bathroom?: No Shower chair or bench in shower?: No Elevated toilet seat or a handicapped toilet?: No  TIMED UP AND GO:  Was the test performed?  No  Cognitive Function: 6CIT completed        06/03/2023   12:04 PM 06/03/2022    3:20 PM  6CIT Screen  What Year? 0 points 0  points  What month? 0 points 0 points  What time? 0 points 0 points  Count back from 20 0 points 0 points  Months in reverse 0 points 0 points  Repeat phrase 0 points 0 points  Total Score 0 points 0 points    Immunizations Immunization History  Administered Date(s) Administered   Fluad Trivalent(High Dose 65+) 01/05/2023   Influenza, High Dose Seasonal PF 02/13/2021   Influenza,inj,Quad PF,6+ Mos 01/05/2018, 01/05/2019, 01/19/2020   Influenza-Unspecified 01/02/2017   PFIZER(Purple Top)SARS-COV-2 Vaccination 05/22/2019, 06/12/2019, 02/06/2020   PNEUMOCOCCAL CONJUGATE-20 05/28/2021   Pfizer Covid-19 Vaccine Bivalent Booster 24yrs & up 07/31/2020, 02/13/2021   Tdap 04/15/2010, 01/19/2020   Zoster Recombinant(Shingrix) 11/03/2017, 01/05/2018    Screening Tests Health Maintenance  Topic Date Due   OPHTHALMOLOGY EXAM  Never done   COVID-19 Vaccine (6 - 2024-25 season) 11/16/2022   HEMOGLOBIN A1C  10/12/2023   Diabetic kidney evaluation - eGFR measurement  01/20/2024   Diabetic kidney evaluation - Urine ACR  03/16/2024   Colonoscopy  03/26/2024   FOOT EXAM  04/13/2024   Medicare Annual Wellness (AWV)  06/02/2024   MAMMOGRAM  07/31/2024   DEXA SCAN  07/31/2026   DTaP/Tdap/Td (3 - Td or Tdap) 01/18/2030   Pneumonia Vaccine 74+ Years old  Completed   INFLUENZA  VACCINE  Completed   Hepatitis C Screening  Completed   Zoster Vaccines- Shingrix  Completed   HPV VACCINES  Aged Out    Health Maintenance  Health Maintenance Due  Topic Date Due   OPHTHALMOLOGY EXAM  Never done   COVID-19 Vaccine (6 - 2024-25 season) 11/16/2022   Health Maintenance Items Addressed: Mammogram ordered, See Nurse Notes  Additional Screening:  Vision Screening: Recommended annual ophthalmology exams for early detection of glaucoma and other disorders of the eye.  Dental Screening: Recommended annual dental exams for proper oral hygiene  Community Resource Referral / Chronic Care Management: CRR  required this visit?  No   CCM required this visit?  No     Plan:     I have personally reviewed and noted the following in the patient's chart:   Medical and social history Use of alcohol, tobacco or illicit drugs  Current medications and supplements including opioid prescriptions. Patient is not currently taking opioid prescriptions. Functional ability and status Nutritional status Physical activity Advanced directives List of other physicians Hospitalizations, surgeries, and ER visits in previous 12 months Vitals Screenings to include cognitive, depression, and falls Referrals and appointments  In addition, I have reviewed and discussed with patient certain preventive protocols, quality metrics, and best practice recommendations. A written personalized care plan for preventive services as well as general preventive health recommendations were provided to patient.     Tora Kindred, CMA   06/03/2023   After Visit Summary: (MyChart) Due to this being a telephonic visit, the after visit summary with patients personalized plan was offered to patient via MyChart   Notes:  Diabetic eye exam scheduled for 06/16/23 FBS 108 per patient Placed referral to DM & Nutrition education Placed order for MMG due 08/02/23 Gave list of local eye doctors. Patient wants to establish with an ophthalmologist.

## 2023-06-03 NOTE — Patient Instructions (Addendum)
 Angela Villarreal , Thank you for taking time to come for your Medicare Wellness Visit. I appreciate your ongoing commitment to your health goals. Please review the following plan we discussed and let me know if I can assist you in the future.   Referrals/Orders/Follow-Ups/Clinician Recommendations: I have placed a referral to Diabetic & Nutrition Education. Someone from there office will contact you to make appointment. I have placed an order for your mammogram due 08/02/23. Call [facility] @ [phone] to schedule at your convenience. I have included a list of local eye doctors in the area.  This is a list of the screening recommended for you and due dates:  Health Maintenance  Topic Date Due   Eye exam for diabetics  Never done   COVID-19 Vaccine (6 - 2024-25 season) 11/16/2022   Mammogram  08/01/2023   Hemoglobin A1C  10/12/2023   Yearly kidney function blood test for diabetes  01/20/2024   Yearly kidney health urinalysis for diabetes  03/16/2024   Colon Cancer Screening  03/26/2024   Complete foot exam   04/13/2024   Medicare Annual Wellness Visit  06/02/2024   DEXA scan (bone density measurement)  07/31/2026   DTaP/Tdap/Td vaccine (3 - Td or Tdap) 01/18/2030   Pneumonia Vaccine  Completed   Flu Shot  Completed   Hepatitis C Screening  Completed   Zoster (Shingles) Vaccine  Completed   HPV Vaccine  Aged Out    Advanced directives: (Copy Requested) Please bring a copy of your health care power of attorney and living will to the office to be added to your chart at your convenience. You can mail to [facility] [address] [place], [place] [address] or email to [email or web address]  Next Medicare Annual Wellness Visit scheduled for next year: Yes, 06/08/24 @ 11:30am (phone visit)  There are several Eye Doctors in your area. Here are a few that usually accept all insurance types:  [facility] [address] [place], [place] [address] Phone:  [phone]  [facility] [address] [place], [place] [address] Phone: [phone]  [facility] [address] [place], [place] [address] Phone: [phone]  [facility] [address] [place], [place] [address] Phone: [phone]  [facility] [address] [place], [place] [address] Phone: [phone]  [facility] [address] [place], [place] [address] Phone: [phone]  Please let us know if you require a referral for an eye exam appointment. Thank you!

## 2023-06-16 DIAGNOSIS — H35033 Hypertensive retinopathy, bilateral: Secondary | ICD-10-CM | POA: Diagnosis not present

## 2023-06-22 ENCOUNTER — Encounter: Attending: Family Medicine | Admitting: Dietician

## 2023-06-22 DIAGNOSIS — Z713 Dietary counseling and surveillance: Secondary | ICD-10-CM | POA: Diagnosis not present

## 2023-06-22 DIAGNOSIS — E1169 Type 2 diabetes mellitus with other specified complication: Secondary | ICD-10-CM

## 2023-06-22 DIAGNOSIS — E119 Type 2 diabetes mellitus without complications: Secondary | ICD-10-CM | POA: Insufficient documentation

## 2023-06-22 NOTE — Progress Notes (Signed)
 Diabetes Self-Management Education  Visit Type: First/Initial  Appt. Start Time: 1630 Appt. End Time: 1810  06/22/2023  Angela Villarreal, identified by name and date of birth, is a 68 y.o. female with a diagnosis of Diabetes: Type 2.   ASSESSMENT  There were no vitals taken for this visit. There is no height or weight on file to calculate BMI.   Diabetes Self-Management Education - 06/22/23 1830       Visit Information   Visit Type First/Initial      Initial Visit   Diabetes Type Type 2    Date Diagnosed 12/2022    Are you currently following a meal plan? Yes    What type of meal plan do you follow? low carb, low sugar    Are you taking your medications as prescribed? Yes      Health Coping   How would you rate your overall health? Good      Psychosocial Assessment   Patient Belief/Attitude about Diabetes Motivated to manage diabetes    Special Needs None    How often do you need to have someone help you when you read instructions, pamphlets, or other written materials from your doctor or pharmacy? 1 - Never    What is the last grade level you completed in school? graduate school      Pre-Education Assessment   Patient understands the diabetes disease and treatment process. Needs Instruction    Patient understands incorporating nutritional management into lifestyle. Comprehends key points    Patient undertands incorporating physical activity into lifestyle. Comprehends key points    Patient understands using medications safely. Comprehends key points    Patient understands monitoring blood glucose, interpreting and using results Comprehends key points    Patient understands prevention, detection, and treatment of acute complications. Needs Instruction    Patient understands prevention, detection, and treatment of chronic complications. Needs Instruction    Patient understands how to develop strategies to address psychosocial issues. Needs Instruction    Patient  understands how to develop strategies to promote health/change behavior. Comprehends key points      Complications   Last HgB A1C per patient/outside source 6.6 %    How often do you check your blood sugar? 1-2 times/day    Fasting Blood glucose range (mg/dL) --   no results given   Postprandial Blood glucose range (mg/dL) --   no results given   Have you had a dilated eye exam in the past 12 months? Yes    Have you had a dental exam in the past 12 months? Yes    Are you checking your feet? No      Activity / Exercise   Activity / Exercise Type Light (walking / raking leaves)    How many days per week do you exercise? 2.5    How many minutes per day do you exercise? 30    Total minutes per week of exercise 75      Patient Education   Previous Diabetes Education No    Disease Pathophysiology Definition of diabetes, type 1 and 2, and the diagnosis of diabetes;Factors that contribute to the development of diabetes    Healthy Eating Role of diet in the treatment of diabetes and the relationship between the three main macronutrients and blood glucose level;Plate Method;Meal timing in regards to the patients' current diabetes medication.;Other (comment)   basic food groups, importance of portion control   Being Active Role of exercise on diabetes management, blood pressure control  and cardiac health.    Monitoring Purpose and frequency of SMBG.;Taught/discussed recording of test results and interpretation of SMBG.;Identified appropriate SMBG and/or A1C goals.;Interpreting lab values - A1C, lipid, urine microalbumina.    Acute complications Taught prevention, symptoms, and  treatment of hypoglycemia - the 15 rule.;Discussed and identified patients' prevention, symptoms, and treatment of hyperglycemia.    Chronic complications Relationship between chronic complications and blood glucose control;Lipid levels, blood glucose control and heart disease    Diabetes Stress and Support Role of stress on  diabetes      Outcomes   Expected Outcomes Demonstrated interest in learning. Expect positive outcomes    Future DMSE Other (comment)   06/29/23 class 2 of 3            Individualized Plan for Diabetes Self-Management Training:   Learning Objective:  Patient will have a greater understanding of diabetes self-management. Patient education plan is to attend individual and/or group sessions per assessed needs and concerns.   Plan:   There are no Patient Instructions on file for this visit.  Expected Outcomes:  Demonstrated interest in learning. Expect positive outcomes  Education material provided: ADA - How to Thrive: A Guide for Your Journey with Diabetes and A1C conversion sheet; General nutrition guidelines; blood sugar record; food diary sheet  If problems or questions, patient to contact team via:  Phone  Future DSME appointment: Other (comment) (06/29/23 class 2 of 3)

## 2023-06-29 ENCOUNTER — Encounter: Admitting: Dietician

## 2023-06-29 DIAGNOSIS — Z713 Dietary counseling and surveillance: Secondary | ICD-10-CM | POA: Diagnosis not present

## 2023-06-29 DIAGNOSIS — E1169 Type 2 diabetes mellitus with other specified complication: Secondary | ICD-10-CM | POA: Diagnosis not present

## 2023-06-29 DIAGNOSIS — E119 Type 2 diabetes mellitus without complications: Secondary | ICD-10-CM | POA: Diagnosis not present

## 2023-06-29 NOTE — Progress Notes (Signed)
 Patient was seen on 06/29/23 for the second of a series of three diabetes self-management courses at [facility]. The following learning objectives were met by the patient during this class:  Describe the role of different macronutrients on glucose Explain how carbohydrates affect blood glucose State what foods contain the most carbohydrates Demonstrate carbohydrate counting Demonstrate how to read Nutrition Facts food label Describe effects of various fats on heart health Describe the importance of good nutrition for health and healthy eating strategies Describe techniques for managing your shopping, cooking and meal planning List strategies to follow meal plan when dining out Describe the effects of alcohol on glucose and how to use it safely  Goals:  Follow Diabetes Meal Plan as instructed  Aim to spread carbs evenly throughout the day  Aim for 3 meals per day and snacks as needed Include lean protein foods to meals/snacks  Monitor glucose levels as instructed by your doctor   Follow-Up Plan: Attend Core 3 Work towards following your personal food plan.

## 2023-07-06 ENCOUNTER — Encounter: Admitting: Dietician

## 2023-07-06 DIAGNOSIS — E119 Type 2 diabetes mellitus without complications: Secondary | ICD-10-CM | POA: Diagnosis not present

## 2023-07-06 DIAGNOSIS — E1169 Type 2 diabetes mellitus with other specified complication: Secondary | ICD-10-CM | POA: Diagnosis not present

## 2023-07-06 DIAGNOSIS — Z713 Dietary counseling and surveillance: Secondary | ICD-10-CM | POA: Diagnosis not present

## 2023-07-06 NOTE — Progress Notes (Addendum)
 Patient was seen on 07/06/23 for the third of a series of three diabetes self-management courses at [facility].   State the amount of activity recommended for healthy living Describe activities suitable for individual needs Identify ways to regularly incorporate activity into daily life Identify barriers to activity and ways to over come these barriers Identify diabetes medications being personally used and their primary action for lowering glucose and possible side effects Describe role of stress on blood glucose and develop strategies to address psychosocial issues Identify diabetes complications and ways to prevent them Explain how to manage diabetes during illness Evaluate success in meeting personal goal Establish 2-3 goals that they will plan to diligently work on  Goals:  I will count carbohydrate grams or servings for most meals and snacks. I will be active 30 minutes or more 2-3 times a week I will  take my medications as prescribed  Your patient has identified these potential barriers to change:  No barriers listed  Your patient has identified their diabetes self-care support plan as  Family Support Online resources     Plan:  Return for 1:1 visit in 3 months Attend Support Group as desired

## 2023-07-09 ENCOUNTER — Encounter: Payer: Self-pay | Admitting: Family Medicine

## 2023-07-13 ENCOUNTER — Other Ambulatory Visit: Payer: Self-pay | Admitting: Family Medicine

## 2023-07-14 ENCOUNTER — Ambulatory Visit: Payer: Self-pay | Admitting: Family Medicine

## 2023-07-14 ENCOUNTER — Ambulatory Visit (INDEPENDENT_AMBULATORY_CARE_PROVIDER_SITE_OTHER): Admitting: Family Medicine

## 2023-07-14 ENCOUNTER — Encounter: Payer: Self-pay | Admitting: Family Medicine

## 2023-07-14 VITALS — BP 130/80 | HR 78 | Ht 65.0 in | Wt 193.3 lb

## 2023-07-14 DIAGNOSIS — I152 Hypertension secondary to endocrine disorders: Secondary | ICD-10-CM | POA: Diagnosis not present

## 2023-07-14 DIAGNOSIS — E1159 Type 2 diabetes mellitus with other circulatory complications: Secondary | ICD-10-CM

## 2023-07-14 DIAGNOSIS — Z0001 Encounter for general adult medical examination with abnormal findings: Secondary | ICD-10-CM

## 2023-07-14 DIAGNOSIS — E569 Vitamin deficiency, unspecified: Secondary | ICD-10-CM

## 2023-07-14 DIAGNOSIS — E1169 Type 2 diabetes mellitus with other specified complication: Secondary | ICD-10-CM

## 2023-07-14 DIAGNOSIS — E039 Hypothyroidism, unspecified: Secondary | ICD-10-CM | POA: Diagnosis not present

## 2023-07-14 DIAGNOSIS — Z1231 Encounter for screening mammogram for malignant neoplasm of breast: Secondary | ICD-10-CM

## 2023-07-14 DIAGNOSIS — E66811 Obesity, class 1: Secondary | ICD-10-CM

## 2023-07-14 DIAGNOSIS — E538 Deficiency of other specified B group vitamins: Secondary | ICD-10-CM | POA: Diagnosis not present

## 2023-07-14 DIAGNOSIS — Z6832 Body mass index (BMI) 32.0-32.9, adult: Secondary | ICD-10-CM

## 2023-07-14 DIAGNOSIS — E785 Hyperlipidemia, unspecified: Secondary | ICD-10-CM | POA: Diagnosis not present

## 2023-07-14 DIAGNOSIS — Z0000 Encounter for general adult medical examination without abnormal findings: Secondary | ICD-10-CM

## 2023-07-14 MED ORDER — METFORMIN HCL ER 750 MG PO TB24: 750.0000 mg | ORAL_TABLET | Freq: Every day | ORAL | 1 refills | Status: DC

## 2023-07-14 NOTE — Assessment & Plan Note (Signed)
 Blood pressure initially elevated at 151/83 mmHg, improved to 130/80 mmHg after rest. Discussed lifestyle modifications, including dietary sodium reduction, to help manage blood pressure. - Recheck blood pressure during visit - Continue current antihypertensive regimen - Encourage dietary sodium reduction

## 2023-07-14 NOTE — Patient Instructions (Signed)
 Call [facility] to schedule a mammogram [phone]

## 2023-07-14 NOTE — Assessment & Plan Note (Signed)
 Type 2 diabetes mellitus with recent A1c of 6.6%. She has been experiencing difficulty with the timing of metformin  doses. Recent weight loss of 11 pounds and blood glucose levels within target range. Actively engaged in diabetes management through diet, exercise, and monitoring blood glucose levels. Switching to metformin  extended release is expected to improve adherence and reduce gastrointestinal side effects. - Switch to metformin  extended release 750 mg once daily with dinner - Order A1c test - Encourage continued use of diabetes management app and lifestyle modifications

## 2023-07-14 NOTE — Progress Notes (Signed)
 Complete physical exam   Patient: Angela Villarreal   DOB: Mar 01, 1956   68 y.o. Female  MRN: [id number] Visit Date: 07/14/2023  Today's healthcare provider: Aden Agreste, MD   Chief Complaint  Patient presents with   Annual Exam    Last completed 06/17/22 Diet -  low carb, low sugar Exercise - twice a week for at least 15-20 minutes Feeling - well Sleeping - good Concerns -  wondering if metformin  can be changed to extended release due to forgetting to take some morning   Subjective    Angela Villarreal is a 68 y.o. female who presents today for a complete physical exam.   Discussed the use of AI scribe software for clinical note transcription with the patient, who gave verbal consent to proceed.  History of Present Illness   Angela Villarreal is a 68 year old female with type 2 diabetes who presents for an annual physical exam and medication management.  She experiences difficulty adhering to her metformin  regimen, taking 500 mg twice daily. She often misses the morning dose due to her routine of taking Synthroid  and waiting 30 minutes before eating. The evening dose is taken without issue. She seeks to simplify her regimen to improve adherence.  She manages her type 2 diabetes with metformin , Synthroid , and dietary modifications. She attends diabetes education classes and uses an app to track her diet and blood sugar levels. She has lost 11 pounds since January and regularly monitors her blood sugar, which remains within range. She is working on improving her exercise routine.  Her current medications include Synthroid  in the morning, Crestor , Singulair , and diltiazem  at night, and Maxzide  in the morning. She takes allergy medication at night due to its sedative effects.  She has a family history of glaucoma and is considering switching to an ophthalmologist for more comprehensive eye care, given her tiny cataracts and family history.        Last depression screening  scores    06/22/2023    6:26 PM 06/03/2023   11:54 AM 05/12/2023   11:12 AM  PHQ 2/9 Scores  PHQ - 2 Score 0 0 0  PHQ- 9 Score  0 2   Last fall risk screening    06/22/2023    6:26 PM  Fall Risk   Falls in the past year? 0        Medications: Outpatient Medications Prior to Visit  Medication Sig   albuterol  (PROVENTIL ) (2.5 MG/3ML) 0.083% nebulizer solution Take 3 mLs (2.5 mg total) by nebulization every 6 (six) hours as needed for wheezing or shortness of breath.   albuterol  (VENTOLIN  HFA) 108 (90 Base) MCG/ACT inhaler Inhale 1-2 puffs into the lungs every 4 (four) hours as needed for wheezing or shortness of breath.   Ascorbic Acid (VITAMIN C ADULT GUMMIES PO) Take 1 tablet by mouth daily.   Blood Glucose Monitoring Suppl DEVI 1 each by Does not apply route in the morning, at noon, and at bedtime. May substitute to any manufacturer covered by patient's insurance.   budesonide-formoterol  (SYMBICORT) 160-4.5 MCG/ACT inhaler Inhale 2 puffs into the lungs 2 (two) times daily.   diltiazem  (CARDIZEM  CD) 120 MG 24 hr capsule TAKE 1 CAPSULE (120 MG) BY MOUTH EVERY DAY   fluticasone  (FLONASE ) 50 MCG/ACT nasal spray 2 SPRAYS IN EACH NOSTRIL EVERY DAY   Glucose Blood (BLOOD GLUCOSE TEST STRIPS) STRP 1 each by In Vitro route in the morning, at noon, and at bedtime. May  substitute to any manufacturer covered by patient's insurance.   guaiFENesin -codeine  100-10 MG/5ML syrup Take 10 mLs by mouth 3 (three) times daily as needed for cough.   hyoscyamine  (LEVBID) 0.375 MG 12 hr tablet TAKE ONE TABLET BY MOUTH TWICE DAILY (Patient taking differently: as needed.)   loratadine  (CLARITIN ) 10 MG tablet Take 10 mg by mouth daily.   montelukast  (SINGULAIR ) 10 MG tablet Take 1 tablet (10 mg total) by mouth at bedtime.   Multiple Vitamin (MULTIVITAMIN) tablet Take 1 tablet by mouth daily.   Nebulizer MISC Use with nebulized liquid q 4-6 hours prn   omeprazole (PRILOSEC) 40 MG capsule Take 40 mg by mouth  daily.   potassium chloride  SA (KLOR-CON  M) 20 MEQ tablet TAKE ONE TABLET TWICE DAILY   rosuvastatin  (CRESTOR ) 5 MG tablet TAKE 1 TABLET BY MOUTH DAILY   SYNTHROID  125 MCG tablet TAKE ONE TABLET EACH MORNING BEFORE BREAKFAST   triamterene -hydrochlorothiazide  (MAXZIDE -25) 37.5-25 MG tablet TAKE 1 TABLET BY MOUTH DAILY   Zinc Citrate (ZINC GUMMY PO) Take 1 tablet by mouth daily.   [DISCONTINUED] metFORMIN  (GLUCOPHAGE ) 500 MG tablet Take 1 tablet (500 mg total) by mouth 2 (two) times daily with a meal.   [DISCONTINUED] sulfamethoxazole -trimethoprim  (BACTRIM  DS) 800-160 MG tablet Take 1 tablet by mouth 2 (two) times daily. (Patient not taking: Reported on 06/03/2023)   No facility-administered medications prior to visit.    Review of Systems    Objective    BP 130/80 (BP Location: Left Arm, Patient Position: Sitting, Cuff Size: Large)   Pulse 78   Ht 5\' 5"  (1.651 m)   Wt 193 lb 4.8 oz (87.7 kg)   SpO2 100%   BMI 32.17 kg/m    Physical Exam Vitals reviewed.  Constitutional:      General: She is not in acute distress.    Appearance: Normal appearance. She is well-developed. She is not diaphoretic.  HENT:     Head: Normocephalic and atraumatic.     Right Ear: Tympanic membrane, ear canal and external ear normal.     Left Ear: Tympanic membrane, ear canal and external ear normal.     Nose: Nose normal.     Mouth/Throat:     Mouth: Mucous membranes are moist.     Pharynx: Oropharynx is clear. No oropharyngeal exudate.  Eyes:     General: No scleral icterus.    Conjunctiva/sclera: Conjunctivae normal.     Pupils: Pupils are equal, round, and reactive to light.  Neck:     Thyroid : No thyromegaly.  Cardiovascular:     Rate and Rhythm: Normal rate and regular rhythm.     Heart sounds: Normal heart sounds. No murmur heard. Pulmonary:     Effort: Pulmonary effort is normal. No respiratory distress.     Breath sounds: Normal breath sounds. No wheezing or rales.  Abdominal:      General: There is no distension.     Palpations: Abdomen is soft.     Tenderness: There is no abdominal tenderness.  Musculoskeletal:        General: No deformity.     Cervical back: Neck supple.     Right lower leg: No edema.     Left lower leg: No edema.  Lymphadenopathy:     Cervical: No cervical adenopathy.  Skin:    General: Skin is warm and dry.     Findings: No rash.  Neurological:     Mental Status: She is alert and oriented to person, place, and time.  Mental status is at baseline.     Gait: Gait normal.  Psychiatric:        Mood and Affect: Mood normal.        Behavior: Behavior normal.        Thought Content: Thought content normal.      No results found for any visits on 07/14/23.  Assessment & Plan    Routine Health Maintenance and Physical Exam  Exercise Activities and Dietary recommendations  Goals      Exercise 150 min/wk Moderate Activity        Immunization History  Administered Date(s) Administered   Fluad Trivalent(High Dose 65+) 01/05/2023   Influenza, High Dose Seasonal PF 02/13/2021   Influenza,inj,Quad PF,6+ Mos 01/05/2018, 01/05/2019, 01/19/2020   Influenza-Unspecified 01/02/2017   PFIZER(Purple Top)SARS-COV-2 Vaccination 05/22/2019, 06/12/2019, 02/06/2020   PNEUMOCOCCAL CONJUGATE-20 05/28/2021   Pfizer Covid-19 Vaccine Bivalent Booster 10yrs & up 07/31/2020, 02/13/2021   Tdap 04/15/2010, 01/19/2020   Zoster Recombinant(Shingrix ) 11/03/2017, 01/05/2018    Health Maintenance  Topic Date Due   OPHTHALMOLOGY EXAM  Never done   COVID-19 Vaccine (6 - 2024-25 season) 11/16/2022   MAMMOGRAM  08/01/2023   HEMOGLOBIN A1C  10/12/2023   INFLUENZA VACCINE  10/16/2023   Diabetic kidney evaluation - eGFR measurement  01/20/2024   Diabetic kidney evaluation - Urine ACR  03/16/2024   Colonoscopy  03/26/2024   FOOT EXAM  04/13/2024   Medicare Annual Wellness (AWV)  06/02/2024   DEXA SCAN  07/31/2026   DTaP/Tdap/Td (3 - Td or Tdap) 01/18/2030    Pneumonia Vaccine 15+ Years old  Completed   Hepatitis C Screening  Completed   Zoster Vaccines- Shingrix   Completed   HPV VACCINES  Aged Out   Meningococcal B Vaccine  Aged Out    Discussed health benefits of physical activity, and encouraged her to engage in regular exercise appropriate for her age and condition.  Problem List Items Addressed This Visit       Cardiovascular and Mediastinum   Hypertension associated with diabetes (HCC)   Blood pressure initially elevated at 151/83 mmHg, improved to 130/80 mmHg after rest. Discussed lifestyle modifications, including dietary sodium reduction, to help manage blood pressure. - Recheck blood pressure during visit - Continue current antihypertensive regimen - Encourage dietary sodium reduction      Relevant Medications   metFORMIN  (GLUCOPHAGE -XR) 750 MG 24 hr tablet   Other Relevant Orders   Comprehensive metabolic panel with GFR     Endocrine   Hyperlipidemia associated with type 2 diabetes mellitus (HCC)   Stable on Crestor  5mg . Last cholesterol check was within normal range. -Continue Crestor  5mg  daily. -Recheck cholesterol      Relevant Medications   metFORMIN  (GLUCOPHAGE -XR) 750 MG 24 hr tablet   Other Relevant Orders   Comprehensive metabolic panel with GFR   Lipid Panel With LDL/HDL Ratio   Adult hypothyroidism   Stable on Synthroid  125mcg daily. Recent thyroid  function tests within normal range. -Continue Synthroid  125mcg daily.      Relevant Orders   TSH   Diabetes mellitus (HCC)   Type 2 diabetes mellitus with recent A1c of 6.6%. She has been experiencing difficulty with the timing of metformin  doses. Recent weight loss of 11 pounds and blood glucose levels within target range. Actively engaged in diabetes management through diet, exercise, and monitoring blood glucose levels. Switching to metformin  extended release is expected to improve adherence and reduce gastrointestinal side effects. - Switch to metformin   extended release 750 mg once daily  with dinner - Order A1c test - Encourage continued use of diabetes management app and lifestyle modifications      Relevant Medications   metFORMIN  (GLUCOPHAGE -XR) 750 MG 24 hr tablet   Other Relevant Orders   Comprehensive metabolic panel with GFR   Hemoglobin A1c     Other   Obesity   Relevant Medications   metFORMIN  (GLUCOPHAGE -XR) 750 MG 24 hr tablet   Other Visit Diagnoses       Annual physical exam    -  Primary   Relevant Orders   Comprehensive metabolic panel with GFR   Lipid Panel With LDL/HDL Ratio   Hemoglobin A1c   VITAMIN D 25 Hydroxy (Vit-D Deficiency, Fractures)   Vitamin B12   TSH     Avitaminosis       Relevant Orders   VITAMIN D 25 Hydroxy (Vit-D Deficiency, Fractures)     Vitamin B 12 deficiency       Relevant Orders   Vitamin B12     Breast cancer screening by mammogram       Relevant Orders   MM 3D SCREENING MAMMOGRAM BILATERAL BREAST            Wellness Visit Routine wellness visit conducted. Discussed general health maintenance, including eye exams and mammograms. Considering switching from an optometrist to an ophthalmologist for future eye care due to potential needs related to cataracts and glaucoma. She has tiny cataracts and a family history of glaucoma, which may necessitate future ophthalmologic care. - Order routine labs including thyroid , kidney and liver function, A1c, cholesterol, vitamin D, and B12 - Order mammogram - Encourage follow-up with ophthalmologist for future eye care        Return in about 6 months (around 01/13/2024) for chronic disease f/u.     Aden Agreste, MD  [facility] [phone] (phone) [phone] (fax)  [facility]

## 2023-07-14 NOTE — Assessment & Plan Note (Signed)
 Stable on Crestor  5mg . Last cholesterol check was within normal range. -Continue Crestor  5mg  daily. -Recheck cholesterol

## 2023-07-14 NOTE — Assessment & Plan Note (Signed)
Stable on Synthroid daily. Recent thyroid function tests within normal range. -Continue Synthroid daily.

## 2023-07-15 ENCOUNTER — Encounter: Payer: Self-pay | Admitting: Family Medicine

## 2023-07-15 LAB — COMPREHENSIVE METABOLIC PANEL WITH GFR
ALT: 14 IU/L (ref 0–32)
AST: 16 IU/L (ref 0–40)
Albumin: 4.5 g/dL (ref 3.9–4.9)
Alkaline Phosphatase: 81 IU/L (ref 44–121)
BUN/Creatinine Ratio: 18 (ref 12–28)
BUN: 16 mg/dL (ref 8–27)
Bilirubin Total: 0.4 mg/dL (ref 0.0–1.2)
CO2: 26 mmol/L (ref 20–29)
Calcium: 9.5 mg/dL (ref 8.7–10.3)
Chloride: 101 mmol/L (ref 96–106)
Creatinine, Ser: 0.9 mg/dL (ref 0.57–1.00)
Globulin, Total: 2.5 g/dL (ref 1.5–4.5)
Glucose: 105 mg/dL — ABNORMAL HIGH (ref 70–99)
Potassium: 4.1 mmol/L (ref 3.5–5.2)
Sodium: 141 mmol/L (ref 134–144)
Total Protein: 7 g/dL (ref 6.0–8.5)
eGFR: 70 mL/min/{1.73_m2} (ref >59)

## 2023-07-15 LAB — LIPID PANEL WITH LDL/HDL RATIO
Cholesterol, Total: 137 mg/dL (ref 100–199)
HDL: 52 mg/dL (ref >39)
LDL Chol Calc (NIH): 63 mg/dL (ref 0–99)
LDL/HDL Ratio: 1.2 ratio (ref 0.0–3.2)
Triglycerides: 121 mg/dL (ref 0–149)
VLDL Cholesterol Cal: 22 mg/dL (ref 5–40)

## 2023-07-15 LAB — TSH: TSH: 0.713 u[IU]/mL (ref 0.450–4.500)

## 2023-07-15 LAB — VITAMIN D 25 HYDROXY (VIT D DEFICIENCY, FRACTURES): Vit D, 25-Hydroxy: 39 ng/mL (ref 30.0–100.0)

## 2023-07-15 LAB — VITAMIN B12: Vitamin B-12: 279 pg/mL (ref 232–1245)

## 2023-07-15 LAB — HEMOGLOBIN A1C
Est. average glucose Bld gHb Est-mCnc: 126 mg/dL
Hgb A1c MFr Bld: 6 % — ABNORMAL HIGH (ref 4.8–5.6)

## 2023-07-15 NOTE — Telephone Encounter (Signed)
 Unable to refill per protocol, Rx expired. Discontinued 07/14/23,dose change and refill.  Requested Prescriptions  Pending Prescriptions Disp Refills   metFORMIN  (GLUCOPHAGE ) 500 MG tablet [Pharmacy Med Name: METFORMIN  HCL 500 MG TAB] 60 tablet 3    Sig: TAKE ONE TABLET BY MOUTH TWICE DAILY WITH A MEAL     Endocrinology:  Diabetes - Biguanides Failed - 07/15/2023  1:27 PM      Failed - CBC within normal limits and completed in the last 12 months    WBC  Date Value Ref Range Status  06/17/2022 6.8 3.4 - 10.8 x10E3/uL Final  11/24/2021 6.8 4.0 - 10.5 K/uL Final   RBC  Date Value Ref Range Status  06/17/2022 4.52 3.77 - 5.28 x10E6/uL Final  11/24/2021 4.10 3.87 - 5.11 MIL/uL Final   Hemoglobin  Date Value Ref Range Status  06/17/2022 12.9 11.1 - 15.9 g/dL Final   Hematocrit  Date Value Ref Range Status  06/17/2022 39.2 34.0 - 46.6 % Final   MCHC  Date Value Ref Range Status  06/17/2022 32.9 31.5 - 35.7 g/dL Final  13/10/6576 46.9 30.0 - 36.0 g/dL Final   [facility]  Date Value Ref Range Status  06/17/2022 28.5 26.6 - 33.0 pg Final  11/24/2021 27.6 26.0 - 34.0 pg Final   MCV  Date Value Ref Range Status  06/17/2022 87 79 - 97 fL Final   No results found for: "PLTCOUNTKUC", "LABPLAT", "POCPLA" RDW  Date Value Ref Range Status  06/17/2022 13.6 11.7 - 15.4 % Final         Passed - Cr in normal range and within 360 days    Creatinine, Ser  Date Value Ref Range Status  07/14/2023 0.90 0.57 - 1.00 mg/dL Final         Passed - HBA1C is between 0 and 7.9 and within 180 days    Hgb A1c MFr Bld  Date Value Ref Range Status  07/14/2023 6.0 (H) 4.8 - 5.6 % Final    Comment:             Prediabetes: 5.7 - 6.4          Diabetes: >6.4          Glycemic control for adults with diabetes: <7.0          Passed - eGFR in normal range and within 360 days    GFR calc Af Amer  Date Value Ref Range Status  03/23/2020 71 >59 mL/min/1.73 Final    Comment:    **In accordance with  recommendations from the NKF-ASN Task force,**   [facility] is in the process of updating its eGFR calculation to the   2021 CKD-EPI creatinine equation that estimates kidney function   without a race variable.    GFR, Estimated  Date Value Ref Range Status  11/24/2021 >60 >60 mL/min Final    Comment:    (NOTE) Calculated using the CKD-EPI Creatinine Equation (2021)    eGFR  Date Value Ref Range Status  07/14/2023 70 >59 mL/min/1.73 Final         Passed - B12 Level in normal range and within 720 days    Vitamin B-12  Date Value Ref Range Status  07/14/2023 279 232 - 1,245 pg/mL Final         Passed - Valid encounter within last 6 months    Recent Outpatient Visits           Yesterday Annual physical exam   [facility] [place], Stan Eans, [place]   2 months ago Vasovagal syncope   [facility] [place], Monalisa Angles, [place]       Future Appointments             In 3 months Elta Halter, MD [facility]

## 2023-08-04 ENCOUNTER — Ambulatory Visit
Admission: RE | Admit: 2023-08-04 | Discharge: 2023-08-04 | Disposition: A | Discharge status: Home / Self Care | Source: Ambulatory Visit | Attending: Family Medicine | Admitting: Family Medicine

## 2023-08-04 DIAGNOSIS — Z1231 Encounter for screening mammogram for malignant neoplasm of breast: Secondary | ICD-10-CM | POA: Diagnosis not present

## 2023-08-07 ENCOUNTER — Ambulatory Visit: Payer: Self-pay | Admitting: Family Medicine

## 2023-08-12 ENCOUNTER — Other Ambulatory Visit: Payer: Self-pay | Admitting: Family Medicine

## 2023-08-12 NOTE — Telephone Encounter (Unsigned)
 Copied from CRM [id number]. Topic: Clinical - Medication Refill >> Aug 12, 2023  2:49 PM DeAngela L wrote: Medication:  100 Onetouch delica plus lancets 33g Patient just picked up a refill for the test strips and she does not have any Lancets and she is leaving Friday 5/32/25 to go out of town and would like the office to call the pharmacy prescription refill for Lancet    Has the patient contacted their pharmacy? Yes  (Agent: If no, request that the patient contact the pharmacy for the refill. If patient does not wish to contact the pharmacy document the reason why and proceed with request.) (Agent: If yes, when and what did the pharmacy advise?)  This is the patient's preferred pharmacy:  [facility] - [place], [place] - [address] [place] [address] Phone: [phone] Fax: [phone]  Is this the correct pharmacy for this prescription? Yes  If no, delete pharmacy and type the correct one.   Has the prescription been filled recently? Yes   Is the patient out of the medication? No   Has the patient been seen for an appointment in the last year OR does the patient have an upcoming appointment? Yes   Can we respond through MyChart? Yes   Agent: Please be advised that Rx refills may take up to 3 business days. We ask that you follow-up with your pharmacy.

## 2023-08-13 MED ORDER — BLOOD GLUCOSE TEST VI STRP: 1.0000 | ORAL_STRIP | Freq: Three times a day (TID) | 1 refills | Status: DC

## 2023-08-13 NOTE — Telephone Encounter (Signed)
 Requested medication (s) are due for refill today: see note below  Requested medication (s) are on the active medication list: no  Last refill:    Future visit scheduled: yes  Notes to clinic:  pt is needing lancets, not test strips.not on med list.  Pt going out of town tomorrow.      Requested Prescriptions  Pending Prescriptions Disp Refills   Glucose Blood (BLOOD GLUCOSE TEST STRIPS) STRP 100 strip 1    Sig: 1 each by In Vitro route in the morning, at noon, and at bedtime. May substitute to any manufacturer covered by patient's insurance.     Endocrinology: Diabetes - Testing Supplies Passed - 08/13/2023  9:49 AM      Passed - Valid encounter within last 12 months    Recent Outpatient Visits           1 month ago Annual physical exam   [facility] [place], Stan Eans, [place]   3 months ago Vasovagal syncope   [facility] [place], Monalisa Angles, PA-C       Future Appointments             In 2 months Elta Halter, MD [facility]

## 2023-08-15 DIAGNOSIS — M25562 Pain in left knee: Secondary | ICD-10-CM | POA: Diagnosis not present

## 2023-08-15 DIAGNOSIS — Z6832 Body mass index (BMI) 32.0-32.9, adult: Secondary | ICD-10-CM | POA: Diagnosis not present

## 2023-08-15 DIAGNOSIS — I10 Essential (primary) hypertension: Secondary | ICD-10-CM | POA: Diagnosis not present

## 2023-08-15 DIAGNOSIS — S86912A Strain of unspecified muscle(s) and tendon(s) at lower leg level, left leg, initial encounter: Secondary | ICD-10-CM | POA: Diagnosis not present

## 2023-09-01 DIAGNOSIS — M1712 Unilateral primary osteoarthritis, left knee: Secondary | ICD-10-CM | POA: Diagnosis not present

## 2023-09-08 ENCOUNTER — Other Ambulatory Visit: Payer: Self-pay | Admitting: Family Medicine

## 2023-09-08 DIAGNOSIS — E039 Hypothyroidism, unspecified: Secondary | ICD-10-CM

## 2023-09-08 DIAGNOSIS — I10 Essential (primary) hypertension: Secondary | ICD-10-CM

## 2023-09-10 IMAGING — MR MR MRCP
10 of 11 series · 40 of 48 positions shown · non-contrast
Comparison: Abdominal ultrasound dated June 17, 2021

CLINICAL DATA: Right upper quadrant pain

EXAM:
MRI ABDOMEN WITHOUT CONTRAST  (INCLUDING MRCP)
TECHNIQUE: Multiplanar multisequence MR imaging of the abdomen was performed.
Heavily T2-weighted images of the biliary and pancreatic ducts were
obtained, and three-dimensional MRCP images were rendered by post
processing.

[Series 3: T2 · coronal · 6.0mm · 1.19mm/px · 2 of 32 slices shown (1 of 2)]
[im 1/32]
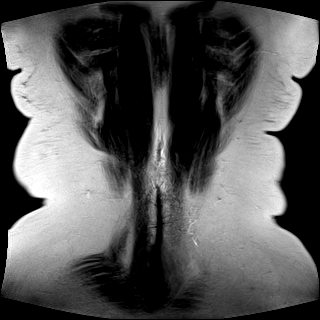
[im 32/32]
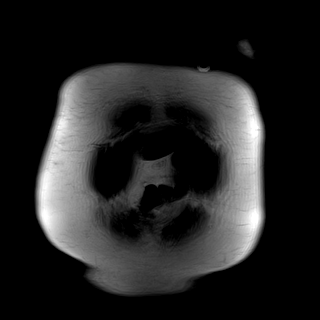

[Series 4: T2 · axial · 6.0mm · 1.19mm/px · z∈[-160,+63]mm · 2 of 32 slices shown (2 of 2)]
[im 1/32]
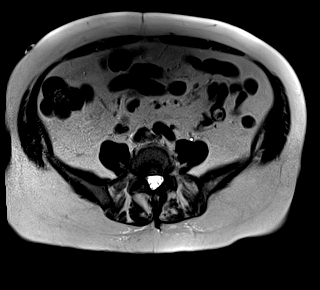
[im 32/32]
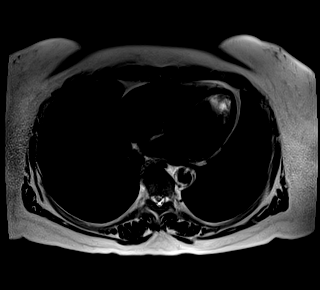

[Series 5: T1 · axial · 3.0mm · 1.19mm/px · z∈[-158,+61]mm · 6 of 74 slices shown (1 of 2)]
[im 1/74]
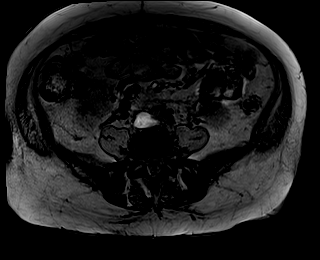
[im 15/74]
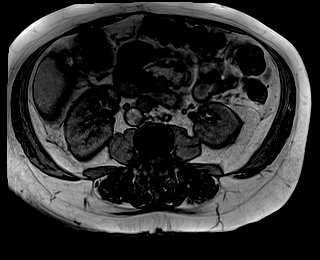
[im 30/74]
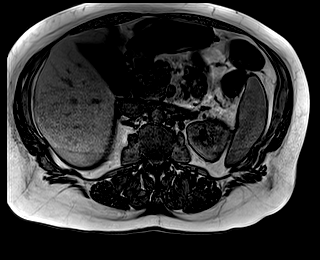
[im 44/74]
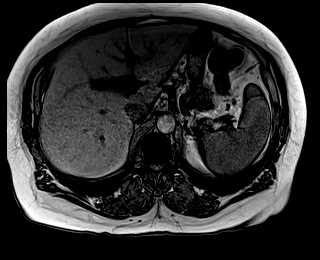
[im 59/74]
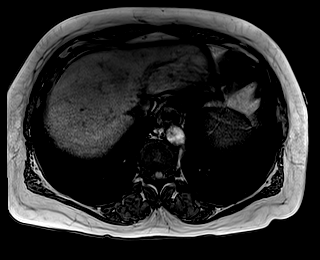
[im 74/74]
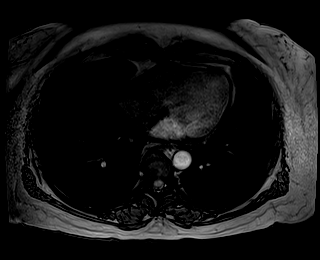

[Series 6: T1 · axial · 3.0mm · 1.19mm/px · z∈[-158,+61]mm · 7 of 74 slices shown (2 of 2)]
[im 1/74]
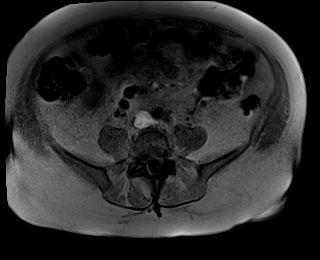
[im 13/74]
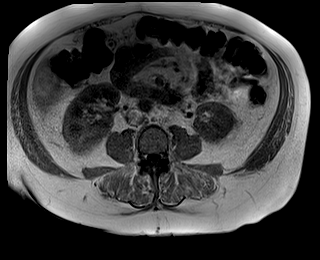
[im 25/74]
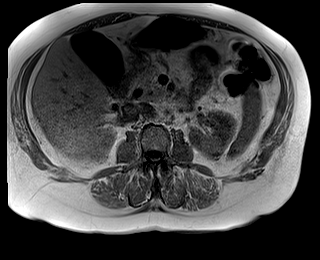
[im 37/74]
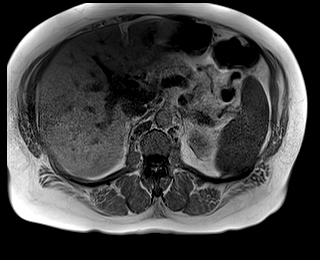
[im 49/74]
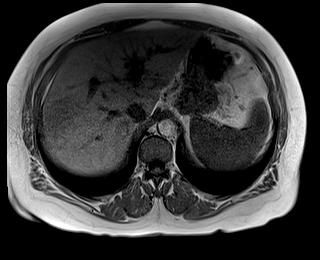
[im 61/74]
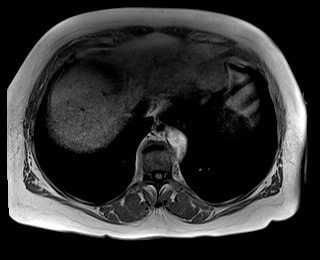
[im 74/74]
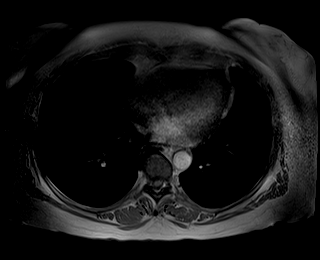

[Series 9: T2 fat-sat · axial · 6.0mm · 1.19mm/px · z∈[-152,+57]mm · 3 of 30 slices shown]
[im 1/30]
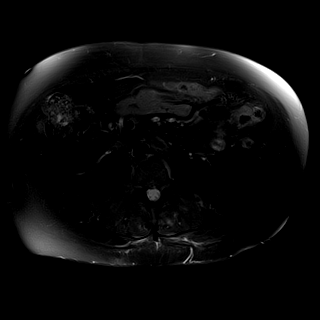
[im 15/30]
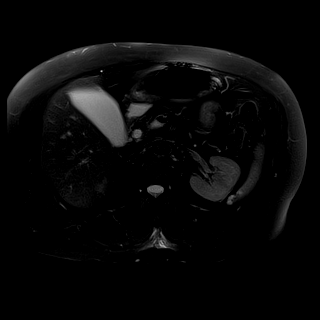
[im 30/30]
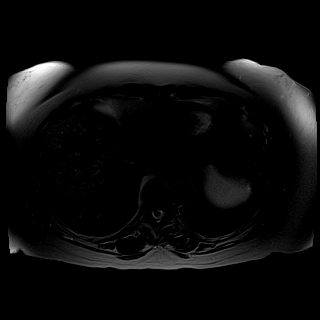

[Series 10: ax dwi_tracew · axial · 6.0mm · 1.42mm/px · z∈[-152,+57]mm · 8 of 90 slices shown]
[im 1/90]
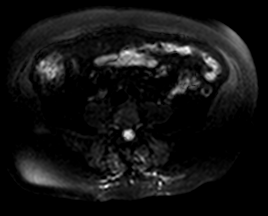
[im 13/90]
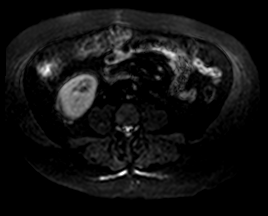
[im 26/90]
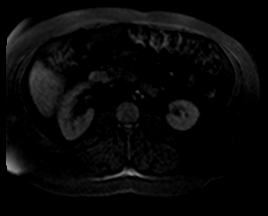
[im 39/90]
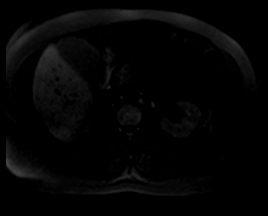
[im 51/90]
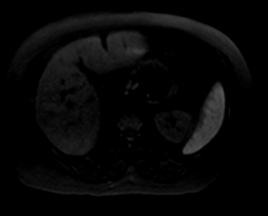
[im 64/90]
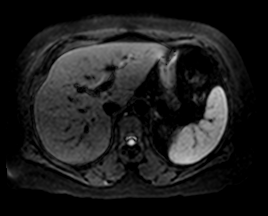
[im 77/90]
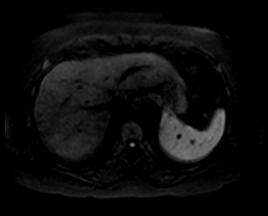
[im 90/90]
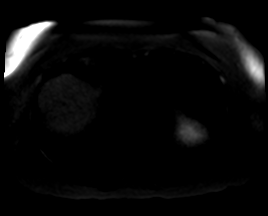

[Series 11: ax dwi_adc · axial · 6.0mm · 1.42mm/px · z∈[-152,+57]mm · 3 of 30 slices shown]
[im 1/30]
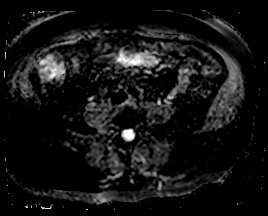
[im 15/30]
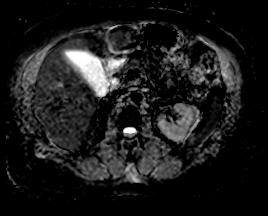
[im 30/30]
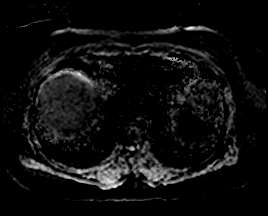

[Series 15: MRCP · coronal · 3.0mm · 1.12mm/px · 2 of 18 slices shown]
[im 1/18]
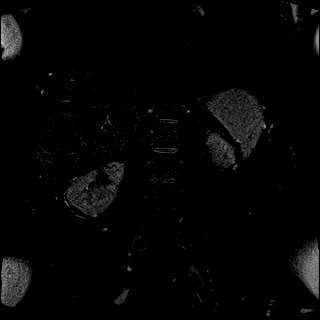
[im 18/18]
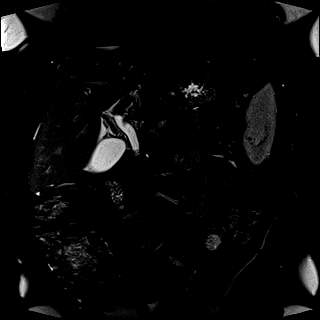

[Series 16: radials · coronal · 50.0mm · 0.78mm/px · 1 of 5 slices shown]
[im 1/5]
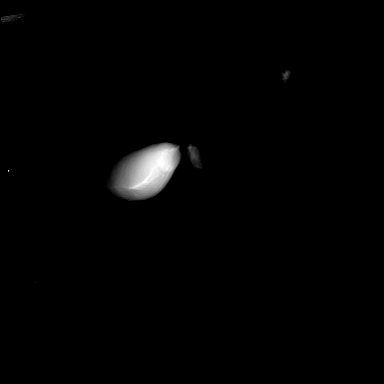

[Series 17: T1 dynamic fat-sat · axial · non-contrast · 3.0mm · 1.19mm/px · z∈[-155,+58]mm · 6 of 72 slices shown]
[im 1/72]
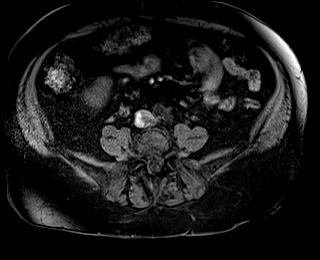
[im 15/72]
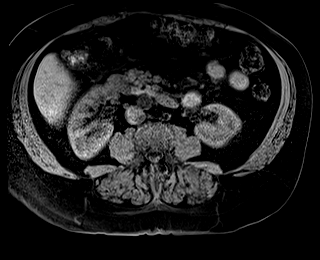
[im 29/72]
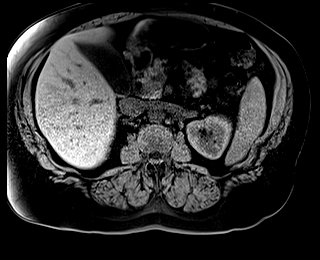
[im 43/72]
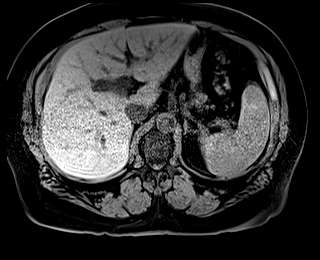
[im 57/72]
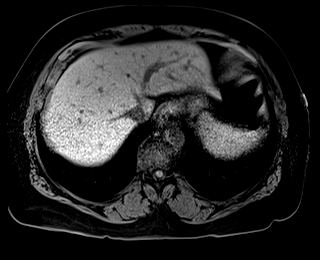
[im 72/72]
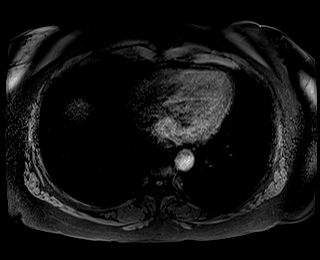

[40 of 48 positions shown; findings below may reference images not displayed]

FINDINGS: Lower chest: No acute findings.

Hepatobiliary: Dilated gallbladder with trace pericholecystic fluid.
Dilated common bile duct measuring up to 10 mm. Filling defect of
the distal common bile duct measuring 6 mm seen on coronal thins,
series 15, image 16.

Pancreas: No mass, inflammatory changes, or other parenchymal
abnormality identified.

Spleen:  Within normal limits in size and appearance.

Adrenals/Urinary Tract: No masses identified. No evidence of
hydronephrosis.

Stomach/Bowel: Small hiatal hernia. Stomach otherwise unremarkable.
Visualized small and large bowel are unremarkable.

Vascular/Lymphatic: No pathologically enlarged lymph nodes
identified. No abdominal aortic aneurysm demonstrated.

Other:  None.

Musculoskeletal: No suspicious bone lesions identified.
IMPRESSION: 1. Dilated gallbladder with trace pericholecystic fluid, concerning
for acute cholecystitis.
2. Dilated common bile duct with filling defect of the distal common
bile duct measuring 6 mm, compatible with choledocholithiasis.

## 2023-09-24 ENCOUNTER — Encounter: Payer: Self-pay | Admitting: Dietician

## 2023-09-24 ENCOUNTER — Encounter: Attending: Family Medicine | Admitting: Dietician

## 2023-09-24 DIAGNOSIS — E1169 Type 2 diabetes mellitus with other specified complication: Secondary | ICD-10-CM | POA: Diagnosis not present

## 2023-09-24 NOTE — Progress Notes (Signed)
 Appointment start:  1400 Appointment End:  1500  Patient attended Diabetes Core Classes 1-3 between 06/22/2023 and 07/06/2023 at [facility]. The purpose of the meeting today is to review information learned during those classes as well as review patient application and goals.  What are one or two positive things that you are doing right now to manage your diabetes?  Exercise, water aerobics. Sticking to healthier foods most of the time.  What is the hardest part about your diabetes right now, causing you the most concern, or is the most worrisome to you about your diabetes?  Setting time aside for themself to make change  What questions do you have today?  N/A  Have you participated in any diabetes support group?  No  History:  T2DM, HTN, HLD, IBS, Hypothyroidism A1C:  6.0% (07/14/2023) Medications include:  Metformin  XR 750 mg once daily Sleep:  Well, unless knee pain Weight:  -3 lbs x3 months Blood Glucose:  Fasting:  ~105-120 mg/dL, Always <869 mg/dL  Have you had any low blood sugar readings in the past month?    Social History:  Pt reports not being quite as vigilant with dietary changes recently, but is doing well overall. Pt  states they were previously tracking food choices using [employer] app, but has not been tracking as much in past couple of months, found it to be cumbersome. Pt states they are familiar with th nutrition of their food choices now. Pt reports choosing ZERO sugar sodas, having Quest bars or Yougrt and fruit for breakfast, choosing vegetables as often as possible.  Exercise:  Water aerobics  24 hour diet recall: Breakfast:  Oikos Lemon Tart 20g protein yogurt w/ blue berries, sprinkle of granola Snack:   Lunch:  [place] club sandwich on wheat bread, cole slaw, small potato salad  Snack:   Dinner:  Broiled flounder, Ear of corn, salad w/tomato, cucumber, ranch dressing Snack:  Cassie  Beverages:  Water, Unsweet tea   Specific  focus but not limited to the following: Review of blood glucose monitoring and interpretation including the recommended target ranges and Hgb A1c.  Review of carbohydrate counting, importance of regularly scheduled meals/snacks, and meal planning to improve quality of diet. Review of the effects of physical activity on glucose levels and long-term glucose control.  Recommended goal of 150 minutes of physical activity/week. Review of patient medications and discussed role of medication on blood glucose and possible side effects. Discussion of strategies to manage stress, psychosocial issues, and other obstacles to diabetes management. Review of short-term complications: hyper- and hypo-glycemia (causes, symptoms, and treatment options) Review of prevention, detection, and treatment of long-term complications.  Discussion of the role of prolonged elevated glucose levels on body systems.  Continuing Goals: Continue to attend your water aerobics on Monday, Wednesday, and Friday! Check your blood sugar each morning before eating or drinking (fasting). Look for numbers between 70-130 mg/dL, and below 899 more frequently Your goal A1c is below 6.0%  Future Follow up:  PRN

## 2023-09-24 NOTE — Patient Instructions (Signed)
 Continue to attend your water aerobics on Monday, Wednesday, and Friday!  Check your blood sugar each morning before eating or drinking (fasting). Look for numbers between 70-130 mg/dL, and below 899 more frequently  Your goal A1c is below 6.0%

## 2023-09-28 ENCOUNTER — Other Ambulatory Visit: Payer: Self-pay | Admitting: Family Medicine

## 2023-09-28 DIAGNOSIS — E876 Hypokalemia: Secondary | ICD-10-CM

## 2023-10-19 DIAGNOSIS — J4541 Moderate persistent asthma with (acute) exacerbation: Secondary | ICD-10-CM | POA: Diagnosis not present

## 2023-10-19 DIAGNOSIS — R0683 Snoring: Secondary | ICD-10-CM | POA: Diagnosis not present

## 2023-10-19 DIAGNOSIS — R053 Chronic cough: Secondary | ICD-10-CM | POA: Diagnosis not present

## 2023-11-10 ENCOUNTER — Encounter: Payer: Self-pay | Admitting: Dermatology

## 2023-11-10 ENCOUNTER — Ambulatory Visit: Payer: BC Managed Care – PPO | Admitting: Dermatology

## 2023-11-10 VITALS — BP 135/62

## 2023-11-10 DIAGNOSIS — Z1283 Encounter for screening for malignant neoplasm of skin: Secondary | ICD-10-CM

## 2023-11-10 DIAGNOSIS — D229 Melanocytic nevi, unspecified: Secondary | ICD-10-CM

## 2023-11-10 DIAGNOSIS — L578 Other skin changes due to chronic exposure to nonionizing radiation: Secondary | ICD-10-CM

## 2023-11-10 DIAGNOSIS — L814 Other melanin hyperpigmentation: Secondary | ICD-10-CM | POA: Diagnosis not present

## 2023-11-10 DIAGNOSIS — Z86018 Personal history of other benign neoplasm: Secondary | ICD-10-CM

## 2023-11-10 DIAGNOSIS — D1801 Hemangioma of skin and subcutaneous tissue: Secondary | ICD-10-CM | POA: Diagnosis not present

## 2023-11-10 DIAGNOSIS — L820 Inflamed seborrheic keratosis: Secondary | ICD-10-CM

## 2023-11-10 DIAGNOSIS — Z85820 Personal history of malignant melanoma of skin: Secondary | ICD-10-CM

## 2023-11-10 DIAGNOSIS — Z872 Personal history of diseases of the skin and subcutaneous tissue: Secondary | ICD-10-CM

## 2023-11-10 DIAGNOSIS — W908XXA Exposure to other nonionizing radiation, initial encounter: Secondary | ICD-10-CM

## 2023-11-10 DIAGNOSIS — I781 Nevus, non-neoplastic: Secondary | ICD-10-CM

## 2023-11-10 NOTE — Patient Instructions (Addendum)

## 2023-11-10 NOTE — Progress Notes (Signed)
 Follow-Up Visit   Subjective  Angela Villarreal is a 68 y.o. female who presents for the following: Skin Cancer Screening and Full Body Skin Exam h xof Melanoma, hx of Atypical Melanocytic Hyperplasia, hx of Aks, check spot L lat thigh, feels like hard bumps under the skin, noticed ~30m ago, check spots R dorsum hand irritating  The patient presents for Total-Body Skin Exam (TBSE) for skin cancer screening and mole check. The patient has spots, moles and lesions to be evaluated, some may be new or changing and the patient may have concern these could be cancer.  The following portions of the chart were reviewed this encounter and updated as appropriate: medications, allergies, medical history  Review of Systems:  No other skin or systemic complaints except as noted in HPI or Assessment and Plan.  Objective  Well appearing patient in no apparent distress; mood and affect are within normal limits.  A full examination was performed including scalp, head, eyes, ears, nose, lips, neck, chest, axillae, abdomen, back, buttocks, bilateral upper extremities, bilateral lower extremities, hands, feet, fingers, toes, fingernails, and toenails. All findings within normal limits unless otherwise noted below.   Relevant physical exam findings are noted in the Assessment and Plan.  L lat thigh x 2, R dorsum hand x 3 (5) Stuck on waxy paps with erythema  Assessment & Plan   SKIN CANCER SCREENING PERFORMED TODAY.  ACTINIC DAMAGE - Chronic condition, secondary to cumulative UV/sun exposure - diffuse scaly erythematous macules with underlying dyspigmentation - Recommend daily broad spectrum sunscreen SPF 30+ to sun-exposed areas, reapply every 2 hours as needed.  - Staying in the shade or wearing long sleeves, sun glasses (UVA+UVB protection) and wide brim hats (4-inch brim around the entire circumference of the hat) are also recommended for sun protection.  - Call for new or changing  lesions.  LENTIGINES, SEBORRHEIC KERATOSES, HEMANGIOMAS - Benign normal skin lesions - Benign-appearing - Call for any changes  MELANOCYTIC NEVI - Tan-brown and/or pink-flesh-colored symmetric macules and papules - Benign appearing on exam today - Observation - Call clinic for new or changing moles - Recommend daily use of broad spectrum spf 30+ sunscreen to sun-exposed areas.   HISTORY OF MELANOMA - No evidence of recurrence today - No lymphadenopathy - Recommend regular full body skin exams - Recommend daily broad spectrum sunscreen SPF 30+ to sun-exposed areas, reapply every 2 hours as needed.  - Call if any new or changing lesions are noted between office visits  - L cheek 2005  HISTORY OF ATYPICAL MELANOCYTIC HYPERPLASIA No evidence of recurrence today Recommend regular full body skin exams Recommend daily broad spectrum sunscreen SPF 30+ to sun-exposed areas, reapply every 2 hours as needed.  Call if any new or changing lesions are noted between office visits  -R clavicle  HISTORY OF PRECANCEROUS ACTINIC KERATOSIS - site(s) of PreCancerous Actinic Keratosis clear today. - these may recur and new lesions may form requiring treatment to prevent transformation into skin cancer - observe for new or changing spots and contact [facility] for appointment if occur - photoprotection with sun protective clothing; sunglasses and broad spectrum sunscreen with SPF of at least 30 + and frequent self skin exams recommended - yearly exams by a dermatologist recommended for persons with history of PreCancerous Actinic Keratoses   Varicose Veins/Spider Veins Legs - Dilated blue, purple or red veins at the lower extremities - Reassured - Smaller vessels can be treated by sclerotherapy (a procedure to inject a  medicine into the veins to make them disappear) if desired, but the treatment is not covered by insurance. Larger vessels may be covered if symptomatic and we would refer to  vascular surgeon if treatment desired.   INFLAMED SEBORRHEIC KERATOSIS (5) L lat thigh x 2, R dorsum hand x 3 (5) Symptomatic, irritating, patient would like treated. Destruction of lesion - L lat thigh x 2, R dorsum hand x 3 (5) Complexity: simple   Destruction method: cryotherapy   Informed consent: discussed and consent obtained   Timeout:  patient name, date of birth, surgical site, and procedure verified Lesion destroyed using liquid nitrogen: Yes   Region frozen until ice ball extended beyond lesion: Yes   Outcome: patient tolerated procedure well with no complications   Post-procedure details: wound care instructions given     Return in about 1 year (around 11/09/2024) for TBSE, Hx of Melanoma, hx of AMP, Hx of AKs.  I, Grayce Saunas, RMA, am acting as scribe for Alm Rhyme, MD .   Documentation: I have reviewed the above documentation for accuracy and completeness, and I agree with the above.  Alm Rhyme, MD

## 2023-12-02 DIAGNOSIS — L03213 Periorbital cellulitis: Secondary | ICD-10-CM | POA: Diagnosis not present

## 2023-12-02 DIAGNOSIS — K219 Gastro-esophageal reflux disease without esophagitis: Secondary | ICD-10-CM | POA: Diagnosis not present

## 2023-12-08 NOTE — Progress Notes (Signed)
 Angela Villarreal                                          MRN: [id number]   12/08/2023   The [facility] Quality Team Specialist reviewed this patient medical record for the purposes of chart review for care gap closure. The following were reviewed: abstraction for care gap closure-controlling blood pressure.    [facility] Quality Team

## 2023-12-29 ENCOUNTER — Ambulatory Visit (INDEPENDENT_AMBULATORY_CARE_PROVIDER_SITE_OTHER): Admitting: Family Medicine

## 2023-12-29 ENCOUNTER — Encounter: Payer: Self-pay | Admitting: Family Medicine

## 2023-12-29 VITALS — BP 136/84 | HR 86 | Ht 65.0 in | Wt 190.9 lb

## 2023-12-29 DIAGNOSIS — M150 Primary generalized (osteo)arthritis: Secondary | ICD-10-CM

## 2023-12-29 DIAGNOSIS — E1159 Type 2 diabetes mellitus with other circulatory complications: Secondary | ICD-10-CM | POA: Diagnosis not present

## 2023-12-29 DIAGNOSIS — Z7984 Long term (current) use of oral hypoglycemic drugs: Secondary | ICD-10-CM

## 2023-12-29 DIAGNOSIS — E039 Hypothyroidism, unspecified: Secondary | ICD-10-CM | POA: Diagnosis not present

## 2023-12-29 DIAGNOSIS — E1169 Type 2 diabetes mellitus with other specified complication: Secondary | ICD-10-CM | POA: Diagnosis not present

## 2023-12-29 DIAGNOSIS — Z23 Encounter for immunization: Secondary | ICD-10-CM

## 2023-12-29 DIAGNOSIS — I152 Hypertension secondary to endocrine disorders: Secondary | ICD-10-CM

## 2023-12-29 DIAGNOSIS — E785 Hyperlipidemia, unspecified: Secondary | ICD-10-CM

## 2023-12-29 DIAGNOSIS — E66811 Obesity, class 1: Secondary | ICD-10-CM

## 2023-12-29 DIAGNOSIS — Z6831 Body mass index (BMI) 31.0-31.9, adult: Secondary | ICD-10-CM

## 2023-12-29 NOTE — Progress Notes (Unsigned)
 Established patient visit   Patient: Angela Villarreal   DOB: 1955-05-23   68 y.o. Female  MRN: [id number] Visit Date: 12/29/2023  Today's healthcare provider: Jon Eva, MD   Chief Complaint  Patient presents with   Medical Management of Chronic Issues   Diabetes   Hyperlipidemia   Hypertension   Subjective    HPI   Discussed the use of AI scribe software for clinical note transcription with the patient, who gave verbal consent to proceed.  History of Present Illness   Angela Villarreal is a 68 year old female who presents for a routine follow-up visit.  She experiences significant discomfort from arthritis, particularly in her hands and knees. 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 She uses frankincense oil, coconut oil, CBD pain stick, and occasionally Voltaren gel for relief, along with frequent use of arthritis strength Tylenol . Her knee pain is primarily due to arthritis, described as 'bone on bone' in the left knee. A cortisone shot on June 17th provided relief, and she is trying to delay the next shot. She plans to get married in January and wishes to avoid surgery before then.  Hypertension is managed with diltiazem  120 mg daily and Maxzide  (triamterene  HCTZ) once daily. She is on Synthroid  125 mcg daily for thyroid  management, rosuvastatin  5 mg daily for cholesterol, and metformin  extended release 750 mg at bedtime for diabetes. Her blood sugar is under 130, though not reaching the desired fasting level of under 100. She has maintained her weight and is content with her current A1c level.       Medications: Outpatient Medications Prior to Visit  Medication Sig   albuterol  (PROVENTIL ) (2.5 MG/3ML) 0.083% nebulizer solution Take 3 mLs (2.5 mg total) by nebulization every 6 (six) hours as needed for wheezing or shortness of breath.   albuterol  (VENTOLIN  HFA) 108 (90  Base) MCG/ACT inhaler Inhale 1-2 puffs into the lungs every 4 (four) hours as needed for wheezing or shortness of breath.   Ascorbic Acid (VITAMIN C ADULT GUMMIES PO) Take 1 tablet by mouth daily.   Blood Glucose Monitoring Suppl DEVI 1 each by Does not apply route in the morning, at noon, and at bedtime. May substitute to any manufacturer covered by patient's insurance.   budesonide-formoterol  (SYMBICORT) 160-4.5 MCG/ACT inhaler Inhale 2 puffs into the lungs 2 (two) times daily.   diltiazem  (CARDIZEM  CD) 120 MG 24 hr capsule TAKE 1 CAPSULE BY MOUTH ONCE DAILY   fluticasone  (FLONASE ) 50 MCG/ACT nasal spray 2 SPRAYS IN EACH NOSTRIL EVERY DAY   Glucose Blood (BLOOD GLUCOSE TEST STRIPS) STRP 1 each by In Vitro route in the morning, at noon, and at bedtime. May substitute to any manufacturer covered by patient's insurance.   guaiFENesin -codeine  100-10 MG/5ML syrup Take 10 mLs by mouth 3 (three) times daily as needed for cough.   hyoscyamine  (LEVBID ) 0.375 MG 12 hr tablet TAKE ONE TABLET BY MOUTH TWICE DAILY (Patient taking differently: as needed.)   loratadine  (CLARITIN ) 10 MG tablet Take 10 mg by mouth daily.   metFORMIN  (GLUCOPHAGE -XR) 750 MG 24 hr tablet TAKE 1 TABLET BY MOUTH AT BEDTIME   montelukast  (SINGULAIR ) 10 MG tablet Take 1 tablet (10 mg total) by mouth at bedtime.   Multiple Vitamin (MULTIVITAMIN) tablet Take 1 tablet by mouth daily.   Nebulizer MISC Use with nebulized liquid q 4-6 hours prn   omeprazole (PRILOSEC) 40 MG  capsule Take 40 mg by mouth daily.   potassium chloride  SA (KLOR-CON  M) 20 MEQ tablet TAKE ONE TABLET TWICE DAILY   rosuvastatin  (CRESTOR ) 5 MG tablet TAKE 1 TABLET BY MOUTH DAILY   SYNTHROID  125 MCG tablet TAKE ONE TABLET EACH MORNING BEFORE BREAKFAST   triamterene -hydrochlorothiazide  (MAXZIDE -25) 37.5-25 MG tablet TAKE 1 TABLET BY MOUTH DAILY   Zinc Citrate (ZINC GUMMY PO) Take 1 tablet by mouth daily.   No facility-administered medications prior to visit.     Review of Systems     Objective    BP 136/84 (BP Location: Left Arm, Patient Position: Sitting, Cuff Size: Large)   Pulse 86   Ht 5' 5 (1.651 m)   Wt 190 lb 14.4 oz (86.6 kg)   SpO2 99%   BMI 31.77 kg/m    Physical Exam Vitals reviewed.  Constitutional:      General: She is not in acute distress.    Appearance: Normal appearance. She is well-developed. She is not diaphoretic.  HENT:     Head: Normocephalic and atraumatic.  Eyes:     General: No scleral icterus.    Conjunctiva/sclera: Conjunctivae normal.  Neck:     Thyroid : No thyromegaly.  Cardiovascular:     Rate and Rhythm: Normal rate and regular rhythm.     Heart sounds: Normal heart sounds. No murmur heard. Pulmonary:     Effort: Pulmonary effort is normal. No respiratory distress.     Breath sounds: Normal breath sounds. No wheezing, rhonchi or rales.  Musculoskeletal:     Cervical back: Neck supple.     Right lower leg: No edema.     Left lower leg: No edema.  Lymphadenopathy:     Cervical: No cervical adenopathy.  Skin:    General: Skin is warm and dry.     Findings: No rash.  Neurological:     Mental Status: She is alert and oriented to person, place, and time. Mental status is at baseline.  Psychiatric:        Mood and Affect: Mood normal.        Behavior: Behavior normal.      Results for orders placed or performed in visit on 12/29/23  Hemoglobin A1c  Result Value Ref Range   Hgb A1c MFr Bld 6.3 (H) 4.8 - 5.6 %   Est. average glucose Bld gHb Est-mCnc 134 mg/dL  TSH  Result Value Ref Range   TSH 0.615 0.450 - 4.500 uIU/mL  Urine Microalbumin w/creat. ratio  Result Value Ref Range   Creatinine, Urine WILL FOLLOW    Microalbumin, Urine WILL FOLLOW    Microalb/Creat Ratio WILL FOLLOW   Comprehensive metabolic panel with GFR  Result Value Ref Range   Glucose 91 70 - 99 mg/dL   BUN 19 8 - 27 mg/dL   Creatinine, Ser 9.23 0.57 - 1.00 mg/dL   eGFR 86 >40 fO/fpw/8.26   BUN/Creatinine Ratio  25 12 - 28   Sodium 141 134 - 144 mmol/L   Potassium 3.7 3.5 - 5.2 mmol/L   Chloride 101 96 - 106 mmol/L   CO2 25 20 - 29 mmol/L   Calcium  10.0 8.7 - 10.3 mg/dL   Total Protein 7.0 6.0 - 8.5 g/dL   Albumin 4.7 3.9 - 4.9 g/dL   Globulin, Total 2.3 1.5 - 4.5 g/dL   Bilirubin Total 0.3 0.0 - 1.2 mg/dL   Alkaline Phosphatase 81 49 - 135 IU/L   AST 15 0 - 40 IU/L   ALT  17 0 - 32 IU/L  Lipid panel  Result Value Ref Range   Cholesterol, Total 133 100 - 199 mg/dL   Triglycerides 822 (H) 0 - 149 mg/dL   HDL 58 >60 mg/dL   VLDL Cholesterol Cal 29 5 - 40 mg/dL   LDL Chol Calc (NIH) 46 0 - 99 mg/dL   Chol/HDL Ratio 2.3 0.0 - 4.4 ratio    Assessment & Plan     Problem List Items Addressed This Visit       Cardiovascular and Mediastinum   Hypertension associated with diabetes (HCC)   Hypertension, currently managed with diltiazem  and Maxzide . Blood pressure was initially elevated at 148/91 mmHg but improved to 130/84 mmHg after settling. - Continue diltiazem  120 mg daily. - Continue Maxzide  (triamterene /HCTZ) once daily. - Recheck blood pressure at next visit.      Relevant Orders   Comprehensive metabolic panel with GFR (Completed)     Endocrine   Hyperlipidemia associated with type 2 diabetes mellitus (HCC)   Hyperlipidemia, managed with rosuvastatin . Previous cholesterol levels were well-controlled. - Continue rosuvastatin  5 mg daily. - Consider annual cholesterol check, but optional to check with current labs.      Relevant Orders   Comprehensive metabolic panel with GFR (Completed)   Lipid panel (Completed)   Adult hypothyroidism   Hypothyroidism, managed with Synthroid . Current dose is 125 mcg daily. - Continue Synthroid  125 mcg daily. - Order thyroid  function tests with upcoming labs.       Relevant Orders   TSH (Completed)   Diabetes mellitus (HCC) - Primary   Type 2 diabetes mellitus, currently well-controlled with metformin . Blood sugar levels are maintained  under 130 mg/dL, though not consistently under 100 mg/dL fasting. She is satisfied with current management and is not aiming for further reduction in A1c below 6%. - Continue metformin  extended release 750 mg at bedtime. - Order A1c test with upcoming labs.      Relevant Orders   Hemoglobin A1c (Completed)   Urine Microalbumin w/creat. ratio (Completed)     Musculoskeletal and Integument   Arthritis, degenerative   Chronic osteoarthritis affecting both knees and hands, with significant pain and swelling, particularly in the left knee and right hand. The left knee is described as bone on bone, and she experiences variable pain levels. 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 She is considering delaying knee surgery until after her wedding in January and is managing pain with non-surgical options. - Recommend using Voltaren gel on affected hand joints, applying a generous amount and allowing it to sit for 15 minutes before washing off. - Consider cortisone injections for knee pain if symptoms worsen. - Continue arthritis strength Tylenol  as needed for pain management.        Other   Obesity   Discussed importance of healthy weight management Discussed diet and exercise          Return in about 6 months (around 06/28/2024) for CPE.       Jon Eva, MD  [facility] [phone] (phone) [phone] (fax)  [facility]

## 2023-12-30 ENCOUNTER — Ambulatory Visit: Payer: Self-pay | Admitting: Family Medicine

## 2023-12-30 LAB — COMPREHENSIVE METABOLIC PANEL WITH GFR
ALT: 17 IU/L (ref 0–32)
AST: 15 IU/L (ref 0–40)
Albumin: 4.7 g/dL (ref 3.9–4.9)
Alkaline Phosphatase: 81 IU/L (ref 49–135)
BUN/Creatinine Ratio: 25 (ref 12–28)
BUN: 19 mg/dL (ref 8–27)
Bilirubin Total: 0.3 mg/dL (ref 0.0–1.2)
CO2: 25 mmol/L (ref 20–29)
Calcium: 10 mg/dL (ref 8.7–10.3)
Chloride: 101 mmol/L (ref 96–106)
Creatinine, Ser: 0.76 mg/dL (ref 0.57–1.00)
Globulin, Total: 2.3 g/dL (ref 1.5–4.5)
Glucose: 91 mg/dL (ref 70–99)
Potassium: 3.7 mmol/L (ref 3.5–5.2)
Sodium: 141 mmol/L (ref 134–144)
Total Protein: 7 g/dL (ref 6.0–8.5)
eGFR: 86 mL/min/1.73 (ref >59)

## 2023-12-30 LAB — TSH: TSH: 0.615 u[IU]/mL (ref 0.450–4.500)

## 2023-12-30 LAB — MICROALBUMIN / CREATININE URINE RATIO
Creatinine, Urine: 48.4 mg/dL
Microalb/Creat Ratio: <6 mg/g{creat} (ref 0–29)
Microalbumin, Urine: <3 ug/mL

## 2023-12-30 LAB — HEMOGLOBIN A1C
Est. average glucose Bld gHb Est-mCnc: 134 mg/dL
Hgb A1c MFr Bld: 6.3 % — ABNORMAL HIGH (ref 4.8–5.6)

## 2023-12-30 LAB — LIPID PANEL
Chol/HDL Ratio: 2.3 ratio (ref 0.0–4.4)
Cholesterol, Total: 133 mg/dL (ref 100–199)
HDL: 58 mg/dL (ref >39)
LDL Chol Calc (NIH): 46 mg/dL (ref 0–99)
Triglycerides: 177 mg/dL — ABNORMAL HIGH (ref 0–149)
VLDL Cholesterol Cal: 29 mg/dL (ref 5–40)

## 2023-12-30 NOTE — Assessment & Plan Note (Signed)
 Discussed importance of healthy weight management Discussed diet and exercise

## 2023-12-30 NOTE — Assessment & Plan Note (Signed)
 Chronic osteoarthritis affecting both knees and hands, with significant pain and swelling, particularly in the left knee and right hand. The left knee is described as bone on bone, and she experiences variable pain levels. 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 She is considering delaying knee surgery until after her wedding in January and is managing pain with non-surgical options. - Recommend using Voltaren gel on affected hand joints, applying a generous amount and allowing it to sit for 15 minutes before washing off. - Consider cortisone injections for knee pain if symptoms worsen. - Continue arthritis strength Tylenol  as needed for pain management.

## 2023-12-30 NOTE — Assessment & Plan Note (Signed)
 Type 2 diabetes mellitus, currently well-controlled with metformin . Blood sugar levels are maintained under 130 mg/dL, though not consistently under 100 mg/dL fasting. She is satisfied with current management and is not aiming for further reduction in A1c below 6%. - Continue metformin  extended release 750 mg at bedtime. - Order A1c test with upcoming labs.

## 2023-12-30 NOTE — Assessment & Plan Note (Signed)
 Hypertension, currently managed with diltiazem  and Maxzide . Blood pressure was initially elevated at 148/91 mmHg but improved to 130/84 mmHg after settling. - Continue diltiazem  120 mg daily. - Continue Maxzide  (triamterene /HCTZ) once daily. - Recheck blood pressure at next visit.

## 2023-12-30 NOTE — Assessment & Plan Note (Signed)
 Hypothyroidism, managed with Synthroid . Current dose is 125 mcg daily. - Continue Synthroid  125 mcg daily. - Order thyroid  function tests with upcoming labs.

## 2023-12-30 NOTE — Assessment & Plan Note (Signed)
 Hyperlipidemia, managed with rosuvastatin . Previous cholesterol levels were well-controlled. - Continue rosuvastatin  5 mg daily. - Consider annual cholesterol check, but optional to check with current labs.

## 2024-01-01 NOTE — Progress Notes (Signed)
 Angela Villarreal                                          MRN: [id number]   01/01/2024   The [facility] Quality Team Specialist reviewed this patient medical record for the purposes of chart review for care gap closure. The following were reviewed: abstraction for care gap closure-kidney health evaluation for diabetes:eGFR  and uACR.    [facility] Quality Team

## 2024-01-08 ENCOUNTER — Telehealth: Payer: Self-pay

## 2024-01-08 DIAGNOSIS — E1169 Type 2 diabetes mellitus with other specified complication: Secondary | ICD-10-CM

## 2024-01-08 MED ORDER — ACCU-CHEK SOFTCLIX LANCETS MISC: 12 refills | Status: AC

## 2024-01-08 MED ORDER — ACCU-CHEK GUIDE ME W/DEVICE KIT: PACK | 0 refills | Status: AC

## 2024-01-08 MED ORDER — ACCU-CHEK GUIDE TEST VI STRP: ORAL_STRIP | 12 refills | Status: AC

## 2024-01-08 NOTE — Telephone Encounter (Signed)
 Copied from CRM [id number]. Topic: Clinical - Medication Question >> Jan 08, 2024 11:38 AM Angela Villarreal wrote: Reason for CRM: Patient needs a prescription for accucheck lancets, accucheck test strips and monitor, her insurance will not cover the one she has anymore- she is almost out of strips- [phone]

## 2024-01-08 NOTE — Telephone Encounter (Signed)
 Sent in

## 2024-01-11 ENCOUNTER — Telehealth: Payer: Self-pay

## 2024-01-11 ENCOUNTER — Ambulatory Visit: Admitting: Family Medicine

## 2024-01-11 DIAGNOSIS — E1169 Type 2 diabetes mellitus with other specified complication: Secondary | ICD-10-CM

## 2024-01-11 NOTE — Telephone Encounter (Signed)
 Contacted pharmacy as rx was sent 01/08/24. They confirmed received and advised they are ready for patient to pick up and she should have received a call. I verbalized understanding   Contacted patient to advise her rx was sent and the pharmacy reported her rx is ready to be picked up. Received patient voicemail. Ok to advise if she contacts the office back

## 2024-01-11 NOTE — Telephone Encounter (Unsigned)
 Copied from CRM [id number]. Topic: Clinical - Prescription Issue >> Jan 11, 2024 10:38 AM Treva T wrote: Reason for CRM: Received call from patient, requesting an updated prescription be sent to pharmacy for quantity of 100 for 30 days supply for testing supplies:  Accu-Chek Softclix Lancets lancets glucose blood (ACCU-CHEK GUIDE TEST) test strip   Per patient states pharmacy has letter from [facility] pharmacy, stating that she prescription written is for 33 day supply, and should be 30 day supply.  Patient is requesting a return call to advise further, [phone]. Or to contact pharmacy to correct previous prescriptions, by submitting new ones.   Per patient has been speaking to pharmacist at [facility], and is expecting new prescriptions.    [facility] - [place], [place] - [address] [place] [address] Phone: [phone] Fax: [phone]

## 2024-01-11 NOTE — Telephone Encounter (Signed)
 Copied from CRM [id number]. Topic: Clinical - Prescription Issue >> Jan 11, 2024 10:38 AM Treva T wrote: Reason for CRM: Received call from patient, requesting an updated prescription be sent to pharmacy for quantity of 100 for 30 days supply for testing supplies:  Accu-Chek Softclix Lancets lancets glucose blood (ACCU-CHEK GUIDE TEST) test strip   Per patient states pharmacy has letter from [facility] pharmacy, stating that she prescription written is for 33 day supply, and should be 30 day supply.  Patient is requesting a return call to advise further, [phone]. Or to contact pharmacy to correct previous prescriptions, by submitting new ones.   Per patient has been speaking to pharmacist at [facility], and is expecting new prescriptions.    [facility] - [place], [place] - [address] [place] [address] Phone: [phone] Fax: [phone]

## 2024-02-09 ENCOUNTER — Other Ambulatory Visit: Payer: Self-pay | Admitting: Family Medicine

## 2024-02-09 DIAGNOSIS — E039 Hypothyroidism, unspecified: Secondary | ICD-10-CM

## 2024-02-09 DIAGNOSIS — I10 Essential (primary) hypertension: Secondary | ICD-10-CM

## 2024-03-30 ENCOUNTER — Other Ambulatory Visit: Payer: Self-pay | Admitting: Family Medicine

## 2024-06-08 ENCOUNTER — Ambulatory Visit

## 2024-07-18 ENCOUNTER — Encounter: Admitting: Family Medicine

## 2024-11-14 ENCOUNTER — Ambulatory Visit: Admitting: Dermatology
